# Patient Record
Sex: Female | Born: 1960 | Race: White | Hispanic: No | Marital: Married | Smoking: Former smoker
Health system: Southern US, Community
[De-identification: ages and names within clinical notes are randomized; demographics above are authoritative.]

## PROBLEM LIST (undated history)

## (undated) DIAGNOSIS — I10 Essential (primary) hypertension: Secondary | ICD-10-CM

## (undated) DIAGNOSIS — B9562 Methicillin resistant Staphylococcus aureus infection as the cause of diseases classified elsewhere: Secondary | ICD-10-CM

## (undated) DIAGNOSIS — N8110 Cystocele, unspecified: Secondary | ICD-10-CM

## (undated) DIAGNOSIS — G43909 Migraine, unspecified, not intractable, without status migrainosus: Secondary | ICD-10-CM

## (undated) DIAGNOSIS — C649 Malignant neoplasm of unspecified kidney, except renal pelvis: Secondary | ICD-10-CM

## (undated) DIAGNOSIS — R7881 Bacteremia: Secondary | ICD-10-CM

## (undated) DIAGNOSIS — I2699 Other pulmonary embolism without acute cor pulmonale: Secondary | ICD-10-CM

## (undated) DIAGNOSIS — I639 Cerebral infarction, unspecified: Secondary | ICD-10-CM

## (undated) HISTORY — PX: RECTOCELE REPAIR: SHX761

## (undated) HISTORY — PX: CYSTOCELE REPAIR: SHX163

## (undated) HISTORY — PX: TUBAL LIGATION: SHX77

## (undated) HISTORY — DX: Cystocele, unspecified: N81.10

## (undated) HISTORY — PX: ABDOMINAL HYSTERECTOMY: SHX81

## (undated) HISTORY — DX: Essential (primary) hypertension: I10

## (undated) HISTORY — DX: Cerebral infarction, unspecified: I63.9

---

## 2002-09-16 ENCOUNTER — Other Ambulatory Visit: Admission: RE | Admit: 2002-09-16 | Discharge: 2002-09-16 | Payer: Self-pay | Admitting: Obstetrics and Gynecology

## 2002-11-28 ENCOUNTER — Encounter: Payer: Self-pay | Admitting: Internal Medicine

## 2002-11-28 ENCOUNTER — Ambulatory Visit (HOSPITAL_COMMUNITY): Admission: RE | Admit: 2002-11-28 | Discharge: 2002-11-28 | Payer: Self-pay | Admitting: Internal Medicine

## 2004-05-01 ENCOUNTER — Encounter: Admission: RE | Admit: 2004-05-01 | Discharge: 2004-05-01 | Payer: Self-pay | Admitting: Internal Medicine

## 2006-08-19 ENCOUNTER — Encounter (INDEPENDENT_AMBULATORY_CARE_PROVIDER_SITE_OTHER): Payer: Self-pay | Admitting: Obstetrics and Gynecology

## 2006-08-19 ENCOUNTER — Inpatient Hospital Stay (HOSPITAL_COMMUNITY): Admission: RE | Admit: 2006-08-19 | Discharge: 2006-08-21 | Payer: Self-pay | Admitting: Obstetrics and Gynecology

## 2010-06-18 NOTE — Discharge Summary (Signed)
[name], [name]               ACCOUNT NO.:  [id number]   MEDICAL RECORD NO.:  [id number]          PATIENT TYPE:  INP   LOCATION:  9316                          FACILITY:  [facility]   PHYSICIAN:  Duke Salvia. Marcelle Overlie, M.D.DATE OF BIRTH:  [birth year]   DATE OF ADMISSION:  08/19/2006  DATE OF DISCHARGE:  08/21/2006                               DISCHARGE SUMMARY   DISCHARGE DIAGNOSES:  1. Symptomatic cystocele and rectocele.  2. Ovarian cyst with pelvic pain.  3. Laparotomy with bilateral salpingo-oophorectomy, lysis of adhesions      with anterior-posterior repair, SSLF this admission.   SUMMARY OF THE HISTORY AND PHYSICAL EXAMINATION:  Please see admission  H&P for details.  Briefly, a 50 year old with prior TAH Burch suspension  presents for correction of cystocele, rectocele and for BSO for pelvic  pain and ovarian cyst.   HOSPITAL COURSE:  On July 16 under general anesthesia, the patient  underwent laparotomy with BSO, lysis of adhesions, A&P repair with SSLF.  On the first postoperative day, her catheter was removed.  Her diet was  advanced.  By the second postoperative day, her incision was clean and  dry.  She was tolerating a regular diet.  Abdominal exam was  unremarkable.  She was afebrile and was ready for discharge at that  point.   LABORATORY DATA:  CMET on admission normal except for glucose slightly  elevated at 114.  Preoperative CBC:  WBC 8.5, hemoglobin of 14.1,  hematocrit 40.9, platelets 244.  Blood type is A+.  Postoperative CBC on  July 17, WBC 15.7, hemoglobin 10.9, hematocrit 31.4.   DISPOSITION:  The patient was discharged on Tylox p.r.n. pain, stool  softeners twice daily.  We did discuss laxative use p.r.n., sitz baths  and Dermaplast as needed p.r.n.  Will return to the office in 3 days to  have the clips removed.  Tylox 1 or 2 p.o. q.4-6h. p.r.n. pain.  Specific instructions regarding diet, sex, exercise advised.  Advised to  report any temperature over  101, incisional redness or drainage,  increased pain or bleeding, difficulty voiding or severe constipation.  She was advised to continue with her routine antihypertensives and  hyperlipidemia medications.   CONDITION:  Good.   ACTIVITY:  Graded increase.      Richard M. Marcelle Overlie, M.D.  Electronically Signed     RMH/MEDQ  D:  08/21/2006  T:  08/21/2006  Job:  [id number]

## 2010-06-18 NOTE — Op Note (Signed)
[name], [name]               ACCOUNT NO.:  [id number]   MEDICAL RECORD NO.:  [id number]          PATIENT TYPE:  AMB   LOCATION:  SDC                           FACILITY:  [facility]   PHYSICIAN:  Duke Salvia. Marcelle Overlie, M.D.DATE OF BIRTH:  [birth year]   DATE OF PROCEDURE:  08/19/2006  DATE OF DISCHARGE:                               OPERATIVE REPORT   PREOPERATIVE DIAGNOSIS:  1. Ovarian cyst, pelvic pain.  2. Symptomatic cystocele and rectocele with a grade 1 vaginal vault      prolapse.   POSTOPERATIVE DIAGNOSIS:  1. Ovarian cyst, pelvic pain.  2. Symptomatic cystocele and rectocele with a grade 1 vaginal vault      prolapse.  3. Plus dense pelvic adhesions.   PROCEDURE:  1. Laparotomy with lysis of adhesions BSO.  2. Anterior posterior colporrhaphy,  3. Sacrospinous ligament vaginal cuff fixation.   PROCEDURE AND FINDINGS:  The patient was taken to the operating room and  after an adequate level of general endotracheal anesthesia obtained, the  patient in frog-leg position.  The abdomen and vagina prepped and draped  in usual manner for abdominal and vaginal procedures.  Foley catheter  was positioned draining clear urine.  Transverse incision made through  the old well-healed Pfannenstiel's scar, carried down to the fascia  which was incised and extended transversely.  Rectus muscle divided  midline.  Peritoneum entered superiorly without incident and extended in  vertical manner.  Were significant omental adhesions into the anterior  abdominal wall which were dissected in avascular plane.  Once this was  accomplished retractor was positioned.  Bowels packed superiorly out of  the field.  The left ovary was enlarged approximately 5-6 cm with the  smooth-walled cyst but was moderately adherent behind sigmoid colon  which required dissection in an avascular plane.  The sigmoid colon off  of the ovary.  Once this was partially completed the left round ligament  was clamped, divided  suture-ligated with 0 Vicryl, held temporarily and  the retroperitoneal space on the left side was developed.  The course of  the left pelvic ureter was noted to be well below, the left IP ligament  was clamped, divided, doubly free tied with 0 Vicryl suture.  The  remainder of the ovarian cyst was dissected free and sent to pathology  in toto.  This was hemostatic.  On the opposite side.  The right ovary  was enlarged approximately 3 cm had some filmy adhesions that were  dissected easily on the right side.  In similar fashion the right round  ligaments clamped and divided and suture-ligated with 0 Vicryl.  The  right retroperitoneal space was developed.  The course of the ureter was  traced out and noted to be well below, the right IP ligament was  clamped, divided and double free tied with 0 Vicryl suture.  The  remainder of the ovary was dissected and sent to pathology.  These areas  were inspected, irrigated noted be hemostatic. Prior to closure sponge,  needle and sharp counts reported correct x2.  Peritoneum closed with a  running 2-0 Vicryl  suture.  Rectus muscles reapproximated 2-0 Vicryl  interrupted sutures.  Fascia closed from laterally to midline on either  side with a 0 PDS suture.  Subcutaneous fat was hemostatic.  Clips Steri-  Strips used on the skin. The legs were extended and the second portion  procedure was started at that point.   The vaginal cuff was grasped with Allis clamps and the midline incision  made on anterior vaginal mucosa from the cuff up to close to the UV  angle.  The cystocele was then reduced with sharp and blunt dissection.  The 2-0 Vicryl interrupted sutures were then used to plicate the  paravesical fascia in the midline.  Excess vaginal mucosa was trimmed  and the mucosa reapproximated with 2-0 Vicryl interrupted sutures.  The  posterior repair, a small triangle of perineal skin was removed.  The  posterior mucosa was divided midline two-thirds  the way up posterior  wall. Sharp and blunt dissection was then used to reduce the rectocele.  The right ischial spine was easily palpable. Retractor was used to  position the rectum laterally.  The surgeon's finger was then used to  palpate the spine and the Capio device was used to place a suture  through the sacrospinous ligament one to two fingerbreadths off of the  spine.  Once this was anchored it was positioned in the appropriate spot  at the vaginal cuff to the posterior mucosa and held temporarily. Small  amount of excess mucosa was then trimmed.  The rectocele was reduced by  plicating the perirectal fascia in the midline.  The vaginal mucosa was  then closed 1/3 of the way down with interrupted 2-0 Vicryl sutures.  And then the sacrospinous fixation suture was tied down to suspend the  cuff.  The remainder of the vaginal mucosa approximated with 2-0 Vicryl  interrupted sutures with 3-0 Vicryl Rapide sutures on the perineum.  One  inch vaginal packs packed with Estrace was then positioned.  Clear urine  noted in the end of case.  She tolerated this well, went to recovery  room in good condition.      Richard M. Marcelle Overlie, M.D.  Electronically Signed     RMH/MEDQ  D:  08/19/2006  T:  08/19/2006  Job:  [id number]

## 2010-11-18 LAB — COMPREHENSIVE METABOLIC PANEL
ALT: 24
AST: 20
Albumin: 3.7
Alkaline Phosphatase: 89
BUN: 9
CO2: 26
Calcium: 9.2
Chloride: 103
Creatinine, Ser: 0.6
GFR calc Af Amer: >60
GFR calc non Af Amer: >60
Glucose, Bld: 114 — ABNORMAL HIGH
Potassium: 4.1
Sodium: 135
Total Bilirubin: 0.7
Total Protein: 6.7

## 2010-11-18 LAB — CBC
HCT: 31.4 — ABNORMAL LOW
HCT: 40.9
Hemoglobin: 10.9 — ABNORMAL LOW
Hemoglobin: 14.1
MCHC: 34.6
MCHC: 34.6
MCV: 91.2
MCV: 92.5
Platelets: 198
Platelets: 244
RBC: 3.39 — ABNORMAL LOW
RBC: 4.48
RDW: 12.7
RDW: 13
WBC: 15.7 — ABNORMAL HIGH
WBC: 8.5

## 2010-11-18 LAB — TYPE AND SCREEN
ABO/RH(D): A POS
Antibody Screen: NEGATIVE

## 2010-11-18 LAB — ABO/RH: ABO/RH(D): A POS

## 2011-11-13 ENCOUNTER — Ambulatory Visit (INDEPENDENT_AMBULATORY_CARE_PROVIDER_SITE_OTHER): Payer: BC Managed Care – PPO | Admitting: Family Medicine

## 2011-11-13 ENCOUNTER — Ambulatory Visit: Payer: BC Managed Care – PPO

## 2011-11-13 VITALS — BP 156/82 | HR 84 | Temp 98.2°F | Resp 16 | Ht 64.0 in | Wt 166.0 lb

## 2011-11-13 DIAGNOSIS — M533 Sacrococcygeal disorders, not elsewhere classified: Secondary | ICD-10-CM

## 2011-11-13 DIAGNOSIS — M25559 Pain in unspecified hip: Secondary | ICD-10-CM

## 2011-11-13 DIAGNOSIS — IMO0002 Reserved for concepts with insufficient information to code with codable children: Secondary | ICD-10-CM | POA: Insufficient documentation

## 2011-11-13 DIAGNOSIS — M25551 Pain in right hip: Secondary | ICD-10-CM

## 2011-11-13 DIAGNOSIS — T148XXA Other injury of unspecified body region, initial encounter: Secondary | ICD-10-CM

## 2011-11-13 DIAGNOSIS — N8110 Cystocele, unspecified: Secondary | ICD-10-CM | POA: Insufficient documentation

## 2011-11-13 MED ORDER — MELOXICAM 7.5 MG PO TABS: 7.5000 mg | ORAL_TABLET | Freq: Every day | ORAL | Status: DC

## 2011-11-13 MED ORDER — CYCLOBENZAPRINE HCL 10 MG PO TABS: 10.0000 mg | ORAL_TABLET | Freq: Three times a day (TID) | ORAL | Status: DC | PRN

## 2011-11-13 MED ORDER — TRAMADOL HCL 50 MG PO TABS: 50.0000 mg | ORAL_TABLET | Freq: Three times a day (TID) | ORAL | Status: DC | PRN

## 2011-11-13 NOTE — Progress Notes (Signed)
[facility]:  Office Visit  Chief Complaint:  Chief Complaint  Patient presents with  . Tailbone Pain    with ischial pain-pulling sensation-was going down to do split and realized not able to do so-tried standing and when doing her loud pop 2 weeks ago    HPI: [name] is a 51 y.o. female who complains of  Of right 7-8/10 localized  ischial pain s/p split x 2 weeks ago. Pulling pain, denies numbness or tingles. Has not affected incontinence. Has  had prior back pain bt nothing like this. Heard a pop when this occurred. Tried Ibuprofen without relief. Took 600 mg BID x 1 day without relief.   Past Medical History  Diagnosis Date  . Vaginal prolapse   . Hypertension    Past Surgical History  Procedure Date  . Rectocele repair   . Tubal ligation   . Abdominal hysterectomy   . Cystocele repair    History   Social History  . Marital Status: Married    Spouse Name: N/A    Number of Children: N/A  . Years of Education: N/A   Social History Main Topics  . Smoking status: Former Smoker    Quit date: 11/12/1981  . Smokeless tobacco: None  . Alcohol Use: Yes  . Drug Use: No  . Sexually Active: None   Other Topics Concern  . None   Social History Narrative  . None   Family History  Problem Relation Age of Onset  . COPD Mother   . Hypertension Mother    No Known Allergies Prior to Admission medications   Medication Sig Start Date End Date Taking? Authorizing Provider  aspirin 81 MG tablet Take 81 mg by mouth daily.   Yes Historical Provider, MD  telmisartan-hydrochlorothiazide (MICARDIS HCT) 80-12.5 MG per tablet Take 1 tablet by mouth daily.   Yes Historical Provider, MD     ROS: The patient denies fevers, chills, night sweats, unintentional weight loss, chest pain, palpitations, wheezing, dyspnea on exertion, nausea, vomiting, abdominal pain, dysuria, hematuria, melena, numbness, weakness, or tingling.  All other systems have been  reviewed and were otherwise negative with the exception of those mentioned in the HPI and as above.    PHYSICAL EXAM: Filed Vitals:   11/13/11 1250  BP: 156/82  Pulse: 84  Temp: 98.2 F (36.8 C)  Resp: 16   Filed Vitals:   11/13/11 1250  Height: 5\' 4"  (1.626 m)  Weight: 166 lb (75.297 kg)   Body mass index is 28.49 kg/(m^2).  General: Alert, no acute distress HEENT:  Normocephalic, atraumatic, oropharynx patent.  Cardiovascular:  Regular rate and rhythm, no rubs murmurs or gallops.  No Carotid bruits, radial pulse intact. No pedal edema.  Respiratory: Clear to auscultation bilaterally.  No wheezes, rales, or rhonchi.  No cyanosis, no use of accessory musculature GI: No organomegaly, abdomen is soft and non-tender, positive bowel sounds.  No masses. Skin: No rashes. Neurologic: Facial musculature symmetric. Psychiatric: Patient is appropriate throughout our interaction. Lymphatic: No cervical lymphadenopathy Musculoskeletal: Gait intact. Right hip-nl L spine- normal ROM, 5/5 strength, 2/2 DTR, sensation intact Right inferior ischium tenderness   LABS: Results for orders placed during the hospital encounter of 08/19/06  CBC      Component Value Range   WBC 8.5     RBC 4.48     Hemoglobin 14.1     HCT 40.9     MCV 91.2     MCHC 34.6  RDW 12.7     Platelets 244    COMPREHENSIVE METABOLIC PANEL      Component Value Range   Sodium 135     Potassium 4.1     Chloride 103     CO2 26     Glucose, Bld 114 (*)    BUN 9     Creatinine, Ser 0.60     Calcium 9.2     Total Protein 6.7     Albumin 3.7     AST 20     ALT 24     Alkaline Phosphatase 89     Total Bilirubin 0.7     GFR calc non Af Amer >60     GFR calc Af Amer       Value: >60            The eGFR has been calculated     using the MDRD equation.     This calculation has not been     validated in all clinical  TYPE AND SCREEN      Component Value Range   ABO/RH(D) A POS     Antibody Screen NEG      Sample Expiration 08/21/2006    ABO/RH      Component Value Range   ABO/RH(D) A POS    CBC      Component Value Range   WBC 15.7 (*)    RBC 3.39 (*)    Hemoglobin 10.9 DELTA CHECK NOTED (*)    HCT 31.4 (*)    MCV 92.5     MCHC 34.6     RDW 13.0     Platelets 198       EKG/XRAY:   Primary read interpreted by Dr. Conley Rolls at [facility]. 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  . Sacral back pain Yes  . Acute right hip pain   . Sprain and strain    Rx Flexeril Rx Mobic Rx Tramadol Home exercises with ROM Will consider MRI if no improvement in the  Future F/u in 2 weeks    LE, THAO PHUONG, DO 11/13/2011 1:53 PM

## 2013-09-19 ENCOUNTER — Other Ambulatory Visit: Payer: Self-pay | Admitting: Obstetrics and Gynecology

## 2013-09-20 LAB — CYTOLOGY - PAP

## 2014-05-24 ENCOUNTER — Other Ambulatory Visit: Payer: Self-pay | Admitting: Internal Medicine

## 2014-05-24 ENCOUNTER — Ambulatory Visit
Admission: RE | Admit: 2014-05-24 | Discharge: 2014-05-24 | Disposition: A | Discharge status: Home / Self Care | Payer: BC Managed Care – PPO | Source: Ambulatory Visit | Attending: Internal Medicine | Admitting: Internal Medicine

## 2014-05-24 DIAGNOSIS — R0602 Shortness of breath: Secondary | ICD-10-CM

## 2014-08-11 ENCOUNTER — Ambulatory Visit
Admission: RE | Admit: 2014-08-11 | Discharge: 2014-08-11 | Disposition: A | Discharge status: Home / Self Care | Payer: BC Managed Care – PPO | Source: Ambulatory Visit | Attending: Nurse Practitioner | Admitting: Nurse Practitioner

## 2014-08-11 ENCOUNTER — Other Ambulatory Visit: Payer: Self-pay | Admitting: Nurse Practitioner

## 2014-08-11 DIAGNOSIS — R059 Cough, unspecified: Secondary | ICD-10-CM

## 2014-08-11 DIAGNOSIS — R05 Cough: Secondary | ICD-10-CM

## 2014-10-04 ENCOUNTER — Other Ambulatory Visit: Payer: Self-pay | Admitting: Obstetrics and Gynecology

## 2014-10-05 LAB — CYTOLOGY - PAP

## 2014-10-11 ENCOUNTER — Other Ambulatory Visit: Payer: Self-pay | Admitting: Obstetrics and Gynecology

## 2014-10-11 DIAGNOSIS — R928 Other abnormal and inconclusive findings on diagnostic imaging of breast: Secondary | ICD-10-CM

## 2014-10-30 ENCOUNTER — Ambulatory Visit
Admission: RE | Admit: 2014-10-30 | Discharge: 2014-10-30 | Disposition: A | Discharge status: Home / Self Care | Payer: BC Managed Care – PPO | Source: Ambulatory Visit | Attending: Obstetrics and Gynecology | Admitting: Obstetrics and Gynecology

## 2014-10-30 DIAGNOSIS — R928 Other abnormal and inconclusive findings on diagnostic imaging of breast: Secondary | ICD-10-CM

## 2018-11-23 ENCOUNTER — Other Ambulatory Visit: Payer: Self-pay | Admitting: Occupational Medicine

## 2018-11-23 ENCOUNTER — Other Ambulatory Visit: Payer: Self-pay

## 2018-11-23 ENCOUNTER — Ambulatory Visit: Payer: Self-pay

## 2018-11-23 DIAGNOSIS — M25561 Pain in right knee: Secondary | ICD-10-CM

## 2020-03-12 ENCOUNTER — Telehealth: Payer: Self-pay | Admitting: Hematology and Oncology

## 2020-03-12 NOTE — Telephone Encounter (Signed)
Received a new hem referral from Dr. Matthew Saras for anemia. [name] has been cld and scheduled to see Dr. Chryl Heck on 2/17 at 1040am. Pt aware to arrive 20 minutes early.

## 2020-03-22 ENCOUNTER — Encounter: Payer: Self-pay | Admitting: Hematology and Oncology

## 2020-03-22 ENCOUNTER — Inpatient Hospital Stay: Payer: BC Managed Care – PPO

## 2020-03-22 ENCOUNTER — Inpatient Hospital Stay: Payer: BC Managed Care – PPO | Attending: Hematology and Oncology | Admitting: Hematology and Oncology

## 2020-03-22 ENCOUNTER — Other Ambulatory Visit: Payer: Self-pay

## 2020-03-22 VITALS — BP 128/56 | HR 87 | Temp 97.7°F | Resp 18 | Ht 64.0 in | Wt 131.0 lb

## 2020-03-22 DIAGNOSIS — I10 Essential (primary) hypertension: Secondary | ICD-10-CM | POA: Diagnosis not present

## 2020-03-22 DIAGNOSIS — Z87891 Personal history of nicotine dependence: Secondary | ICD-10-CM | POA: Insufficient documentation

## 2020-03-22 DIAGNOSIS — R7402 Elevation of levels of lactic acid dehydrogenase (LDH): Secondary | ICD-10-CM | POA: Diagnosis not present

## 2020-03-22 DIAGNOSIS — Z800 Family history of malignant neoplasm of digestive organs: Secondary | ICD-10-CM | POA: Diagnosis not present

## 2020-03-22 DIAGNOSIS — D649 Anemia, unspecified: Secondary | ICD-10-CM

## 2020-03-22 DIAGNOSIS — R634 Abnormal weight loss: Secondary | ICD-10-CM | POA: Diagnosis not present

## 2020-03-22 DIAGNOSIS — Z79899 Other long term (current) drug therapy: Secondary | ICD-10-CM | POA: Insufficient documentation

## 2020-03-22 DIAGNOSIS — D75839 Thrombocytosis, unspecified: Secondary | ICD-10-CM | POA: Diagnosis not present

## 2020-03-22 DIAGNOSIS — Z806 Family history of leukemia: Secondary | ICD-10-CM | POA: Insufficient documentation

## 2020-03-22 LAB — IRON AND TIBC
Iron: 22 ug/dL — ABNORMAL LOW (ref 41–142)
Saturation Ratios: 7 % — ABNORMAL LOW (ref 21–57)
TIBC: 325 ug/dL (ref 236–444)
UIBC: 303 ug/dL (ref 120–384)

## 2020-03-22 LAB — CMP (CANCER CENTER ONLY)
ALT: 19 U/L (ref 0–44)
AST: 50 U/L — ABNORMAL HIGH (ref 15–41)
Albumin: 3.3 g/dL — ABNORMAL LOW (ref 3.5–5.0)
Alkaline Phosphatase: 81 U/L (ref 38–126)
Anion gap: 9 (ref 5–15)
BUN: 17 mg/dL (ref 8–23)
CO2: 25 mmol/L (ref 22–32)
Calcium: 11.5 mg/dL — ABNORMAL HIGH (ref 8.9–10.3)
Chloride: 99 mmol/L (ref 98–111)
Creatinine: 1.11 mg/dL — ABNORMAL HIGH (ref 0.44–1.00)
GFR, Estimated: 57 mL/min — ABNORMAL LOW (ref >60)
Glucose, Bld: 117 mg/dL — ABNORMAL HIGH (ref 70–99)
Potassium: 4.3 mmol/L (ref 3.5–5.1)
Sodium: 133 mmol/L — ABNORMAL LOW (ref 135–145)
Total Bilirubin: 0.4 mg/dL (ref 0.3–1.2)
Total Protein: 7.9 g/dL (ref 6.5–8.1)

## 2020-03-22 LAB — CBC WITH DIFFERENTIAL/PLATELET
Abs Immature Granulocytes: 0.04 10*3/uL (ref 0.00–0.07)
Basophils Absolute: 0.1 10*3/uL (ref 0.0–0.1)
Basophils Relative: 1 %
Eosinophils Absolute: 0.2 10*3/uL (ref 0.0–0.5)
Eosinophils Relative: 3 %
HCT: 30.2 % — ABNORMAL LOW (ref 36.0–46.0)
Hemoglobin: 9.3 g/dL — ABNORMAL LOW (ref 12.0–15.0)
Immature Granulocytes: 1 %
Lymphocytes Relative: 25 %
Lymphs Abs: 2 10*3/uL (ref 0.7–4.0)
MCH: 25.1 pg — ABNORMAL LOW (ref 26.0–34.0)
MCHC: 30.8 g/dL (ref 30.0–36.0)
MCV: 81.4 fL (ref 80.0–100.0)
Monocytes Absolute: 0.7 10*3/uL (ref 0.1–1.0)
Monocytes Relative: 8 %
Neutro Abs: 5.2 10*3/uL (ref 1.7–7.7)
Neutrophils Relative %: 62 %
Platelets: 440 10*3/uL — ABNORMAL HIGH (ref 150–400)
RBC: 3.71 MIL/uL — ABNORMAL LOW (ref 3.87–5.11)
RDW: 14.3 % (ref 11.5–15.5)
WBC: 8.2 10*3/uL (ref 4.0–10.5)
nRBC: 0 % (ref 0.0–0.2)

## 2020-03-22 LAB — LACTATE DEHYDROGENASE: LDH: 781 U/L — ABNORMAL HIGH (ref 98–192)

## 2020-03-22 LAB — VITAMIN B12: Vitamin B-12: 637 pg/mL (ref 180–914)

## 2020-03-22 LAB — RETICULOCYTES
Immature Retic Fract: 23.9 % — ABNORMAL HIGH (ref 2.3–15.9)
RBC.: 3.7 MIL/uL — ABNORMAL LOW (ref 3.87–5.11)
Retic Count, Absolute: 61.1 10*3/uL (ref 19.0–186.0)
Retic Ct Pct: 1.7 % (ref 0.4–3.1)

## 2020-03-22 LAB — FERRITIN: Ferritin: 859 ng/mL — ABNORMAL HIGH (ref 11–307)

## 2020-03-22 NOTE — Progress Notes (Signed)
[facility] NOTE  Patient Care Team: Josetta Huddle, MD as PCP - General (Internal Medicine)  CHIEF COMPLAINTS/PURPOSE OF CONSULTATION:  Anemia.  ASSESSMENT & PLAN:  No problem-specific Assessment & Plan notes found for this encounter.  No orders of the defined types were placed in this encounter.  1. Normocytic normochromic anemia ROS and PE with out any major concerns Review of labs over the past several years shows long standing anemia which may have progressed. Ferritin of 900, TIBC not consistent with iron deficiency Will also evaluate for monoclonal gammopathy, K16 and folic acid levels, hemolysis today. If no evidence of clear etiology, we should consider GI evaluation and BMB. She is agreeable with this approach  2. Age appropriate cancer screening, she is uptodate except she is overdue on colonoscopy.  3.HTN well controlled on losartan/HCTZ  HISTORY OF PRESENTING ILLNESS:   [name] 60 y.o. female is here because of anemia.  [name] is here for an initial visit for evaluation of anemia by herself.  She is a good historian.  She denies any complaints except for some mild weight loss which is unintentional over the past yr about 5-10 lbs denies any other complaints.  She denies any fevers, drenching night sweats, loss of appetite.  She denies any changes in her breathing, bowel habits or urinary habits.  She has not noticed any blood in her stool.  No hematuria. She is overdue for colonoscopy, has been 11 years since her last one.  No known family history of colon cancer.  She is up-to-date with her other age-appropriate cancer screening.  She has had anemia many years ago that she can remember. Rest of the pertinent ten point ROS as mentioned below.  REVIEW OF SYSTEMS:   Constitutional: Denies fevers, chills or abnormal night sweats Eyes: Denies blurriness of vision, double vision or watery eyes Ears, nose, mouth, throat, and face: Denies  mucositis or sore throat Respiratory: Denies cough, dyspnea or wheezes Cardiovascular: Denies palpitation, chest discomfort or lower extremity swelling Gastrointestinal:  Denies nausea, heartburn or change in bowel habits Skin: Denies abnormal skin rashes Lymphatics: Denies new lymphadenopathy or easy bruising Neurological:Denies numbness, tingling or new weaknesses Behavioral/Psych: Mood is stable, no new changes  All other systems were reviewed with the patient and are negative.  MEDICAL HISTORY:  Past Medical History:  Diagnosis Date  . Hypertension   . Vaginal prolapse     SURGICAL HISTORY: Past Surgical History:  Procedure Laterality Date  . ABDOMINAL HYSTERECTOMY    . CYSTOCELE REPAIR    . RECTOCELE REPAIR    . TUBAL LIGATION      SOCIAL HISTORY: Social History   Socioeconomic History  . Marital status: Married    Spouse name: Not on file  . Number of children: Not on file  . Years of education: Not on file  . Highest education level: Not on file  Occupational History  . Not on file  Tobacco Use  . Smoking status: Former Smoker    Quit date: 11/12/1981    Years since quitting: 38.3  . Smokeless tobacco: Never Used  Substance and Sexual Activity  . Alcohol use: Yes  . Drug use: No  . Sexual activity: Not on file  Other Topics Concern  . Not on file  Social History Narrative  . Not on file   Social Determinants of Health   Financial Resource Strain: Not on file  Food Insecurity: Not on file  Transportation Needs: Not on  file  Physical Activity: Not on file  Stress: Not on file  Social Connections: Not on file  Intimate Partner Violence: Not on file    FAMILY HISTORY: Family History  Problem Relation Age of Onset  . COPD Mother   . Hypertension Mother   . Leukemia Father        chronic mylemonocytic leukemia  . Colon cancer Paternal Grandfather     ALLERGIES:  is allergic to amlodipine and crestor [rosuvastatin].  MEDICATIONS:  Current  Outpatient Medications  Medication Sig Dispense Refill  . fenofibrate (TRICOR) 145 MG tablet Take 1 tablet by mouth daily.    . Ferrous Sulfate (IRON) 325 (65 Fe) MG TABS Take 1 tablet by mouth daily.    Marland Kitchen aspirin 81 MG tablet Take 81 mg by mouth daily.    Marland Kitchen losartan-hydrochlorothiazide (HYZAAR) 100-25 MG tablet Take 1 tablet by mouth daily.    . Multiple Vitamin (ONE DAILY) tablet Take 1 tablet by mouth daily.     No current facility-administered medications for this visit.     PHYSICAL EXAMINATION: ECOG PERFORMANCE STATUS: 0 - Asymptomatic  Vitals:   03/22/20 1013  BP: (!) 128/56  Pulse: 87  Resp: 18  Temp: 97.7 F (36.5 C)  SpO2: 100%   Filed Weights   03/22/20 1013  Weight: 131 lb (59.4 kg)    GENERAL:alert, no distress and comfortable SKIN: skin color, texture, turgor are normal, no rashes or significant lesions EYES: normal, conjunctiva are pink and non-injected, sclera clear OROPHARYNX:no exudate, no erythema and lips, buccal mucosa, and tongue normal  NECK: supple, thyroid normal size, non-tender, without nodularity LYMPH:  no palpable lymphadenopathy in the cervical, axillary or inguinal LUNGS: clear to auscultation and percussion with normal breathing effort HEART: regular rate & rhythm and no murmurs and no lower extremity edema ABDOMEN:abdomen soft, non-tender and normal bowel sounds Musculoskeletal:no cyanosis of digits and no clubbing  PSYCH: alert & oriented x 3 with fluent speech NEURO: no focal motor/sensory deficits  LABORATORY DATA:  I have reviewed the data as listed Lab Results  Component Value Date   WBC 15.7 (H) 08/20/2006   HGB 10.9 DELTA CHECK NOTED (L) 08/20/2006   HCT 31.4 (L) 08/20/2006   MCV 92.5 08/20/2006   PLT 198 08/20/2006     Chemistry      Component Value Date/Time   NA 135 08/18/2006 1030   K 4.1 08/18/2006 1030   CL 103 08/18/2006 1030   CO2 26 08/18/2006 1030   BUN 9 08/18/2006 1030   CREATININE 0.60 08/18/2006 1030       Component Value Date/Time   CALCIUM 9.2 08/18/2006 1030   ALKPHOS 89 08/18/2006 1030   AST 20 08/18/2006 1030   ALT 24 08/18/2006 1030   BILITOT 0.7 08/18/2006 1030       RADIOGRAPHIC STUDIES: I have personally reviewed the radiological images as listed and agreed with the findings in the report. No results found.  All questions were answered. The patient knows to call the clinic with any problems, questions or concerns. I spent 45 minutes in the care of this patient including H and P, review of records, counseling and coordination of care. Reviewed records from our health system and [facility] Anemia noted, normocytic, normochromic, mild thrombocytosis.     Benay Pike, MD 03/22/2020 10:34 AM

## 2020-03-23 ENCOUNTER — Telehealth: Payer: Self-pay | Admitting: Hematology and Oncology

## 2020-03-23 ENCOUNTER — Telehealth: Payer: Self-pay

## 2020-03-23 LAB — FOLATE RBC
Folate, Hemolysate: 593 ng/mL
Folate, RBC: 1951 ng/mL (ref >498)
Hematocrit: 30.4 % — ABNORMAL LOW (ref 34.0–46.6)

## 2020-03-23 LAB — KAPPA/LAMBDA LIGHT CHAINS
Kappa free light chain: 31.4 mg/L — ABNORMAL HIGH (ref 3.3–19.4)
Kappa, lambda light chain ratio: 1.59 (ref 0.26–1.65)
Lambda free light chains: 19.7 mg/L (ref 5.7–26.3)

## 2020-03-23 NOTE — Telephone Encounter (Signed)
Scheduled appointment per 2/18 sch msg. Called patient, no answer. Left message with appointment date and time.

## 2020-03-23 NOTE — Telephone Encounter (Signed)
-----   Message from Benay Pike, MD sent at 03/22/2020  5:23 PM EST ----- [name], can we arrange for FU on 28 th, ok to overbook. Let her know that I need to talk to her about lab results.  Thanks

## 2020-03-23 NOTE — Telephone Encounter (Signed)
Patient was made aware of Dr. Rob Hickman recommendations and is aware that scheduling will be reaching out to her to schedule a time to come in and discuss lab work further on 2/28.

## 2020-03-27 LAB — PROTEIN ELECTROPHORESIS, SERUM, WITH REFLEX
A/G Ratio: 0.9 (ref 0.7–1.7)
Albumin ELP: 3.1 g/dL (ref 2.9–4.4)
Alpha-1-Globulin: 0.4 g/dL (ref 0.0–0.4)
Alpha-2-Globulin: 1 g/dL (ref 0.4–1.0)
Beta Globulin: 1.1 g/dL (ref 0.7–1.3)
Gamma Globulin: 1 g/dL (ref 0.4–1.8)
Globulin, Total: 3.5 g/dL (ref 2.2–3.9)
Total Protein ELP: 6.6 g/dL (ref 6.0–8.5)

## 2020-04-02 ENCOUNTER — Other Ambulatory Visit: Payer: Self-pay

## 2020-04-02 ENCOUNTER — Telehealth: Payer: Self-pay | Admitting: Hematology and Oncology

## 2020-04-02 ENCOUNTER — Telehealth (HOSPITAL_COMMUNITY): Payer: Self-pay

## 2020-04-02 ENCOUNTER — Inpatient Hospital Stay: Payer: BC Managed Care – PPO | Admitting: Hematology and Oncology

## 2020-04-02 VITALS — BP 128/63 | HR 100 | Temp 97.5°F | Resp 17 | Ht 64.0 in | Wt 130.3 lb

## 2020-04-02 DIAGNOSIS — D649 Anemia, unspecified: Secondary | ICD-10-CM | POA: Diagnosis not present

## 2020-04-02 DIAGNOSIS — I10 Essential (primary) hypertension: Secondary | ICD-10-CM

## 2020-04-02 NOTE — Telephone Encounter (Signed)
Scheduled per los. Declined printout. Will use mychart

## 2020-04-02 NOTE — Progress Notes (Signed)
Most recent labs, office visit note and referral faxed to Shonda Mcbride at [phone] per MD I[place]ku

## 2020-04-02 NOTE — Progress Notes (Signed)
[facility] NOTE  Patient Care Team: Josetta Huddle, MD as PCP - General (Internal Medicine)  CHIEF COMPLAINTS/PURPOSE OF CONSULTATION:  Anemia.  ASSESSMENT & PLAN:  No problem-specific Assessment & Plan notes found for this encounter.  No orders of the defined types were placed in this encounter.  1. Normocytic normochromic anemia  Review of labs over the past several years shows long standing anemia which may have progressed. Ferritin of 900, TIBC not consistent with iron deficiency. B12 and folate labs normal. 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 Some hypercalcemia noted, repeat CMP ordered. Encouraged hydration. LDH elevated, retic count normal. It does appear that there is some inflammatory etiology to anemia, but no definitive etiology yet We have discussed about proceeding with gastroenterology evaluation, and repeat BMB. Ordered CT guided BMB. Offered BMB with [place] too but she prefers later appointments. I also called [facility] to expedite her GI appointments. Discussed with Reuben Likes who agreed to expedite this. New referral ordered.  2. Age appropriate cancer screening, she is uptodate except she is overdue on colonoscopy.  3.HTN well controlled on losartan/HCTZ  4. Hypercalcemia, no clear etiology Encouraged repeat CMP and hydration. She will get the lab drawn this week.  HISTORY OF PRESENTING ILLNESS:   [name] 60 y.o. female is here because of anemia.  [name] is here for a FU with her sister.  She is a good historian.   Since last visit, no major change in complaints, continues to feel tires and now has no appetite. Weight loss, she is not exactly sure of how much is intentional and how much is non intentional. Previously mentioned that she probably lost about 5-10 lbs, today says it could have been more.  No other interim changes, she denied any hematochezia or melena before. She is overdue for colonoscopy,  has been 11 years since her last one.  No known family history of colon cancer.  She is up-to-date with her other age-appropriate cancer screening.  Rest of the pertinent ten point ROS as mentioned below.  REVIEW OF SYSTEMS:   Constitutional: Denies fevers, chills or abnormal night sweats Eyes: Denies blurriness of vision, double vision or watery eyes Ears, nose, mouth, throat, and face: Denies mucositis or sore throat Respiratory: Denies cough, dyspnea or wheezes Cardiovascular: Denies palpitation, chest discomfort or lower extremity swelling Gastrointestinal:  Denies nausea, heartburn or change in bowel habits Skin: Denies abnormal skin rashes Lymphatics: Denies new lymphadenopathy or easy bruising Neurological:Denies numbness, tingling or new weaknesses Behavioral/Psych: Mood is stable, no new changes  All other systems were reviewed with the patient and are negative.  MEDICAL HISTORY:  Past Medical History:  Diagnosis Date  . Hypertension   . Vaginal prolapse     SURGICAL HISTORY: Past Surgical History:  Procedure Laterality Date  . ABDOMINAL HYSTERECTOMY    . CYSTOCELE REPAIR    . RECTOCELE REPAIR    . TUBAL LIGATION      SOCIAL HISTORY: Social History   Socioeconomic History  . Marital status: Married    Spouse name: Not on file  . Number of children: Not on file  . Years of education: Not on file  . Highest education level: Not on file  Occupational History  . Not on file  Tobacco Use  . Smoking status: Former Smoker    Quit date: 11/12/1981    Years since quitting: 38.4  . Smokeless tobacco: Never Used  Substance and Sexual Activity  . Alcohol use:  Yes  . Drug use: No  . Sexual activity: Not on file  Other Topics Concern  . Not on file  Social History Narrative  . Not on file   Social Determinants of Health   Financial Resource Strain: Not on file  Food Insecurity: Not on file  Transportation Needs: Not on file  Physical Activity: Not on file   Stress: Not on file  Social Connections: Not on file  Intimate Partner Violence: Not on file    FAMILY HISTORY: Family History  Problem Relation Age of Onset  . COPD Mother   . Hypertension Mother   . Leukemia Father        chronic mylemonocytic leukemia  . Colon cancer Paternal Grandfather     ALLERGIES:  is allergic to amlodipine and crestor [rosuvastatin].  MEDICATIONS:  Current Outpatient Medications  Medication Sig Dispense Refill  . aspirin 81 MG tablet Take 81 mg by mouth daily.    . fenofibrate (TRICOR) 145 MG tablet Take 1 tablet by mouth daily.    Marland Kitchen losartan-hydrochlorothiazide (HYZAAR) 100-25 MG tablet Take 1 tablet by mouth daily.    . Multiple Vitamin (ONE DAILY) tablet Take 1 tablet by mouth daily.    . Ferrous Sulfate (IRON) 325 (65 Fe) MG TABS Take 1 tablet by mouth daily. (Patient not taking: Reported on 04/02/2020)     No current facility-administered medications for this visit.     PHYSICAL EXAMINATION: ECOG PERFORMANCE STATUS: 0 - Asymptomatic  Vitals:   04/02/20 0906  BP: 128/63  Pulse: 100  Resp: 17  Temp: (!) 97.5 F (36.4 C)  SpO2: 100%   Filed Weights   04/02/20 0906  Weight: 130 lb 4.8 oz (59.1 kg)    GENERAL:alert, no distress and comfortable SKIN: skin color, texture, turgor are normal, no rashes or significant lesions EYES: normal, conjunctiva are pink and non-injected, sclera clear OROPHARYNX:no exudate, no erythema and lips, buccal mucosa, and tongue normal  NECK: supple, thyroid normal size, non-tender, without nodularity LYMPH:  no palpable lymphadenopathy in the cervical, axillary or inguinal LUNGS: clear to auscultation and percussion with normal breathing effort HEART: regular rate & rhythm and no murmurs and no lower extremity edema ABDOMEN:abdomen soft, non-tender and normal bowel sounds Musculoskeletal:no cyanosis of digits and no clubbing  PSYCH: alert & oriented x 3 with fluent speech NEURO: no focal motor/sensory  deficits  LABORATORY DATA:  I have reviewed the data as listed Lab Results  Component Value Date   WBC 8.2 03/22/2020   HGB 9.3 (L) 03/22/2020   HCT 30.4 (L) 03/22/2020   MCV 81.4 03/22/2020   PLT 440 (H) 03/22/2020     Chemistry      Component Value Date/Time   NA 133 (L) 03/22/2020 1059   K 4.3 03/22/2020 1059   CL 99 03/22/2020 1059   CO2 25 03/22/2020 1059   BUN 17 03/22/2020 1059   CREATININE 1.11 (H) 03/22/2020 1059      Component Value Date/Time   CALCIUM 11.5 (H) 03/22/2020 1059   ALKPHOS 81 03/22/2020 1059   AST 50 (H) 03/22/2020 1059   ALT 19 03/22/2020 1059   BILITOT 0.4 03/22/2020 1059     Reviewed most recent labs. No evidence of nutritional deficiency No MGUS LDH high but retic count normal.  RADIOGRAPHIC STUDIES: I have personally reviewed the radiological images as listed and agreed with the findings in the report. No results found.  All questions were answered. The patient knows to call the clinic with  any problems, questions or concerns. I spent at least 30 minutes in the care of this patient including H and P, review of records, counseling and coordination of care. Reviewed records from our health system and [facility] Anemia noted, normocytic, normochromic, mild thrombocytosis. Discussed with [facility] gastroenterology about the need for sooner appointment. New referral placed as well.    Benay Pike, MD 04/02/2020 9:12 AM

## 2020-04-04 ENCOUNTER — Inpatient Hospital Stay: Payer: BC Managed Care – PPO | Admitting: Hematology and Oncology

## 2020-04-04 ENCOUNTER — Other Ambulatory Visit: Payer: Self-pay | Admitting: Hematology and Oncology

## 2020-04-04 ENCOUNTER — Inpatient Hospital Stay: Payer: BC Managed Care – PPO | Attending: Hematology and Oncology

## 2020-04-04 ENCOUNTER — Other Ambulatory Visit: Payer: Self-pay

## 2020-04-04 DIAGNOSIS — R7402 Elevation of levels of lactic acid dehydrogenase (LDH): Secondary | ICD-10-CM | POA: Diagnosis not present

## 2020-04-04 DIAGNOSIS — R634 Abnormal weight loss: Secondary | ICD-10-CM | POA: Insufficient documentation

## 2020-04-04 DIAGNOSIS — Z87891 Personal history of nicotine dependence: Secondary | ICD-10-CM | POA: Diagnosis not present

## 2020-04-04 DIAGNOSIS — Z8249 Family history of ischemic heart disease and other diseases of the circulatory system: Secondary | ICD-10-CM | POA: Insufficient documentation

## 2020-04-04 DIAGNOSIS — Z800 Family history of malignant neoplasm of digestive organs: Secondary | ICD-10-CM | POA: Diagnosis not present

## 2020-04-04 DIAGNOSIS — D472 Monoclonal gammopathy: Secondary | ICD-10-CM | POA: Diagnosis not present

## 2020-04-04 DIAGNOSIS — D75839 Thrombocytosis, unspecified: Secondary | ICD-10-CM | POA: Diagnosis not present

## 2020-04-04 DIAGNOSIS — R5383 Other fatigue: Secondary | ICD-10-CM | POA: Insufficient documentation

## 2020-04-04 DIAGNOSIS — R200 Anesthesia of skin: Secondary | ICD-10-CM | POA: Diagnosis not present

## 2020-04-04 DIAGNOSIS — Z836 Family history of other diseases of the respiratory system: Secondary | ICD-10-CM | POA: Insufficient documentation

## 2020-04-04 DIAGNOSIS — D649 Anemia, unspecified: Secondary | ICD-10-CM

## 2020-04-04 DIAGNOSIS — Z806 Family history of leukemia: Secondary | ICD-10-CM | POA: Insufficient documentation

## 2020-04-04 DIAGNOSIS — Z79899 Other long term (current) drug therapy: Secondary | ICD-10-CM | POA: Insufficient documentation

## 2020-04-04 DIAGNOSIS — Z888 Allergy status to other drugs, medicaments and biological substances status: Secondary | ICD-10-CM | POA: Diagnosis not present

## 2020-04-04 LAB — CBC WITH DIFFERENTIAL/PLATELET
Abs Immature Granulocytes: 0.04 10*3/uL (ref 0.00–0.07)
Basophils Absolute: 0.1 10*3/uL (ref 0.0–0.1)
Basophils Relative: 1 %
Eosinophils Absolute: 0.1 10*3/uL (ref 0.0–0.5)
Eosinophils Relative: 2 %
HCT: 29.6 % — ABNORMAL LOW (ref 36.0–46.0)
Hemoglobin: 8.8 g/dL — ABNORMAL LOW (ref 12.0–15.0)
Immature Granulocytes: 1 %
Lymphocytes Relative: 22 %
Lymphs Abs: 1.8 10*3/uL (ref 0.7–4.0)
MCH: 24.4 pg — ABNORMAL LOW (ref 26.0–34.0)
MCHC: 29.7 g/dL — ABNORMAL LOW (ref 30.0–36.0)
MCV: 82.2 fL (ref 80.0–100.0)
Monocytes Absolute: 0.7 10*3/uL (ref 0.1–1.0)
Monocytes Relative: 9 %
Neutro Abs: 5.3 10*3/uL (ref 1.7–7.7)
Neutrophils Relative %: 65 %
Platelets: 475 10*3/uL — ABNORMAL HIGH (ref 150–400)
RBC: 3.6 MIL/uL — ABNORMAL LOW (ref 3.87–5.11)
RDW: 14.6 % (ref 11.5–15.5)
WBC: 8.1 10*3/uL (ref 4.0–10.5)
nRBC: 0 % (ref 0.0–0.2)

## 2020-04-04 LAB — CMP (CANCER CENTER ONLY)
ALT: 22 U/L (ref 0–44)
AST: 50 U/L — ABNORMAL HIGH (ref 15–41)
Albumin: 3.1 g/dL — ABNORMAL LOW (ref 3.5–5.0)
Alkaline Phosphatase: 74 U/L (ref 38–126)
Anion gap: 8 (ref 5–15)
BUN: 16 mg/dL (ref 8–23)
CO2: 26 mmol/L (ref 22–32)
Calcium: 11.1 mg/dL — ABNORMAL HIGH (ref 8.9–10.3)
Chloride: 100 mmol/L (ref 98–111)
Creatinine: 0.96 mg/dL (ref 0.44–1.00)
GFR, Estimated: >60 mL/min (ref >60)
Glucose, Bld: 148 mg/dL — ABNORMAL HIGH (ref 70–99)
Potassium: 4.5 mmol/L (ref 3.5–5.1)
Sodium: 134 mmol/L — ABNORMAL LOW (ref 135–145)
Total Bilirubin: 0.4 mg/dL (ref 0.3–1.2)
Total Protein: 7.4 g/dL (ref 6.5–8.1)

## 2020-04-04 NOTE — Progress Notes (Signed)
CB 

## 2020-04-05 ENCOUNTER — Telehealth: Payer: Self-pay

## 2020-04-05 NOTE — Telephone Encounter (Signed)
-----   Message from Otila Kluver, RN sent at 04/05/2020  8:55 AM EST -----  ----- Message ----- From: Benay Pike, MD Sent: 04/04/2020   5:54 PM EST To: [facility]  Please call the patient and let her know that calcium continues to be high, recommend oral hydration at least 2 L a day. Repeat CBC and CMP next week. Ordered. Lab to be scheduled, please help her with this. Then, Hemoglobin continues to drop but no indication for blood transfusion, hopefully the bone marrow evaluation will help [place].

## 2020-04-05 NOTE — Telephone Encounter (Signed)
Communicated lab results/recommendations to the Patient and she verbalized understanding. A scheduling message has been sent to get the patient back in for blood work some time next week.

## 2020-04-06 ENCOUNTER — Telehealth: Payer: Self-pay | Admitting: Hematology and Oncology

## 2020-04-06 NOTE — Telephone Encounter (Signed)
Scheduled appointment per 03/03 schedule message. Contacted patient, patient is aware.

## 2020-04-13 ENCOUNTER — Other Ambulatory Visit: Payer: Self-pay

## 2020-04-13 ENCOUNTER — Other Ambulatory Visit: Payer: Self-pay | Admitting: Radiology

## 2020-04-13 ENCOUNTER — Inpatient Hospital Stay: Payer: BC Managed Care – PPO

## 2020-04-13 DIAGNOSIS — D649 Anemia, unspecified: Secondary | ICD-10-CM | POA: Diagnosis not present

## 2020-04-13 LAB — CBC WITH DIFFERENTIAL/PLATELET
Abs Immature Granulocytes: 0.02 10*3/uL (ref 0.00–0.07)
Basophils Absolute: 0.1 10*3/uL (ref 0.0–0.1)
Basophils Relative: 1 %
Eosinophils Absolute: 0.2 10*3/uL (ref 0.0–0.5)
Eosinophils Relative: 3 %
HCT: 30.4 % — ABNORMAL LOW (ref 36.0–46.0)
Hemoglobin: 9.2 g/dL — ABNORMAL LOW (ref 12.0–15.0)
Immature Granulocytes: 0 %
Lymphocytes Relative: 29 %
Lymphs Abs: 2.4 10*3/uL (ref 0.7–4.0)
MCH: 24.4 pg — ABNORMAL LOW (ref 26.0–34.0)
MCHC: 30.3 g/dL (ref 30.0–36.0)
MCV: 80.6 fL (ref 80.0–100.0)
Monocytes Absolute: 0.8 10*3/uL (ref 0.1–1.0)
Monocytes Relative: 9 %
Neutro Abs: 4.7 10*3/uL (ref 1.7–7.7)
Neutrophils Relative %: 58 %
Platelets: 500 10*3/uL — ABNORMAL HIGH (ref 150–400)
RBC: 3.77 MIL/uL — ABNORMAL LOW (ref 3.87–5.11)
RDW: 14.5 % (ref 11.5–15.5)
WBC: 8.2 10*3/uL (ref 4.0–10.5)
nRBC: 0 % (ref 0.0–0.2)

## 2020-04-13 LAB — CMP (CANCER CENTER ONLY)
ALT: 20 U/L (ref 0–44)
AST: 48 U/L — ABNORMAL HIGH (ref 15–41)
Albumin: 3.3 g/dL — ABNORMAL LOW (ref 3.5–5.0)
Alkaline Phosphatase: 85 U/L (ref 38–126)
Anion gap: 8 (ref 5–15)
BUN: 15 mg/dL (ref 8–23)
CO2: 26 mmol/L (ref 22–32)
Calcium: 11.2 mg/dL — ABNORMAL HIGH (ref 8.9–10.3)
Chloride: 101 mmol/L (ref 98–111)
Creatinine: 1 mg/dL (ref 0.44–1.00)
GFR, Estimated: >60 mL/min (ref >60)
Glucose, Bld: 111 mg/dL — ABNORMAL HIGH (ref 70–99)
Potassium: 4.7 mmol/L (ref 3.5–5.1)
Sodium: 135 mmol/L (ref 135–145)
Total Bilirubin: 0.4 mg/dL (ref 0.3–1.2)
Total Protein: 7.8 g/dL (ref 6.5–8.1)

## 2020-04-16 ENCOUNTER — Encounter (HOSPITAL_COMMUNITY): Payer: Self-pay

## 2020-04-16 ENCOUNTER — Other Ambulatory Visit: Payer: Self-pay

## 2020-04-16 ENCOUNTER — Ambulatory Visit (HOSPITAL_COMMUNITY)
Admission: RE | Admit: 2020-04-16 | Discharge: 2020-04-16 | Disposition: A | Discharge status: Home / Self Care | Payer: BC Managed Care – PPO | Source: Ambulatory Visit | Attending: Hematology and Oncology | Admitting: Hematology and Oncology

## 2020-04-16 ENCOUNTER — Ambulatory Visit (HOSPITAL_COMMUNITY)
Admission: RE | Admit: 2020-04-16 | Discharge: 2020-04-16 | Disposition: A | Discharge status: Home / Self Care | Payer: BC Managed Care – PPO | Source: Ambulatory Visit

## 2020-04-16 DIAGNOSIS — D75839 Thrombocytosis, unspecified: Secondary | ICD-10-CM | POA: Insufficient documentation

## 2020-04-16 DIAGNOSIS — Z87891 Personal history of nicotine dependence: Secondary | ICD-10-CM | POA: Insufficient documentation

## 2020-04-16 DIAGNOSIS — D649 Anemia, unspecified: Secondary | ICD-10-CM | POA: Diagnosis present

## 2020-04-16 LAB — CBC WITH DIFFERENTIAL/PLATELET
Abs Immature Granulocytes: 0.04 10*3/uL (ref 0.00–0.07)
Basophils Absolute: 0.1 10*3/uL (ref 0.0–0.1)
Basophils Relative: 1 %
Eosinophils Absolute: 0.2 10*3/uL (ref 0.0–0.5)
Eosinophils Relative: 2 %
HCT: 32 % — ABNORMAL LOW (ref 36.0–46.0)
Hemoglobin: 9.5 g/dL — ABNORMAL LOW (ref 12.0–15.0)
Immature Granulocytes: 0 %
Lymphocytes Relative: 27 %
Lymphs Abs: 2.5 10*3/uL (ref 0.7–4.0)
MCH: 24.5 pg — ABNORMAL LOW (ref 26.0–34.0)
MCHC: 29.7 g/dL — ABNORMAL LOW (ref 30.0–36.0)
MCV: 82.7 fL (ref 80.0–100.0)
Monocytes Absolute: 0.8 10*3/uL (ref 0.1–1.0)
Monocytes Relative: 9 %
Neutro Abs: 5.7 10*3/uL (ref 1.7–7.7)
Neutrophils Relative %: 61 %
Platelets: 546 10*3/uL — ABNORMAL HIGH (ref 150–400)
RBC: 3.87 MIL/uL (ref 3.87–5.11)
RDW: 14.7 % (ref 11.5–15.5)
WBC: 9.2 10*3/uL (ref 4.0–10.5)
nRBC: 0 % (ref 0.0–0.2)

## 2020-04-16 LAB — PROTIME-INR
INR: 1.1 (ref 0.8–1.2)
Prothrombin Time: 14.1 s (ref 11.4–15.2)

## 2020-04-16 MED ORDER — SODIUM CHLORIDE 0.9 % IV SOLN: INTRAVENOUS | Status: DC

## 2020-04-16 MED ORDER — FLUMAZENIL 0.5 MG/5ML IV SOLN
INTRAVENOUS | Status: AC
  Filled 2020-04-16: qty 5

## 2020-04-16 MED ORDER — FENTANYL CITRATE (PF) 100 MCG/2ML IJ SOLN
INTRAMUSCULAR | Status: AC
  Filled 2020-04-16: qty 2

## 2020-04-16 MED ORDER — HYDROCODONE-ACETAMINOPHEN 5-325 MG PO TABS
1.0000 | ORAL_TABLET | ORAL | Status: DC | PRN
Start: 2020-04-16 — End: 2020-04-17

## 2020-04-16 MED ORDER — FENTANYL CITRATE (PF) 100 MCG/2ML IJ SOLN
INTRAMUSCULAR | Status: AC | PRN
  Administered 2020-04-16 (×2): 50 ug via INTRAVENOUS

## 2020-04-16 MED ORDER — LIDOCAINE HCL (PF) 1 % IJ SOLN
INTRAMUSCULAR | Status: AC | PRN
  Administered 2020-04-16: 10 mL via INTRADERMAL

## 2020-04-16 MED ORDER — MIDAZOLAM HCL 2 MG/2ML IJ SOLN
INTRAMUSCULAR | Status: AC
  Filled 2020-04-16: qty 4

## 2020-04-16 MED ORDER — MIDAZOLAM HCL 2 MG/2ML IJ SOLN
INTRAMUSCULAR | Status: AC | PRN
  Administered 2020-04-16 (×2): 1 mg via INTRAVENOUS

## 2020-04-16 MED ORDER — NALOXONE HCL 0.4 MG/ML IJ SOLN
INTRAMUSCULAR | Status: AC
  Filled 2020-04-16: qty 1

## 2020-04-16 NOTE — H&P (Signed)
Chief Complaint: Patient was seen in consultation today for bone marrow biopsy and aspiration  Referring Physician(s): Iruku, Arletha Pili  Supervising Physician: Ruthann Cancer  Patient Status: [facility] - Out-pt  History of Present Illness: [name] is a 61 y.o. female with a medical history significant for HTN and anemia. Baseline labs for anemia have progressed but the etiology is unclear. Per oncology notes, "TIBC not consistent with iron deficiency. B12 and folate labs normal. MBUS no monoclonal gammopathy, K/L ratio normal. Some hypercalcemia noted, repeat CMP ordered. Encouraged hydration. LDH elevated, retic count normal."   Interventional Radiology has been asked to evaluate this patient for an image-guided bone marrow biopsy and aspiration for further work up.   Past Medical History:  Diagnosis Date  . Hypertension   . Vaginal prolapse     Past Surgical History:  Procedure Laterality Date  . ABDOMINAL HYSTERECTOMY    . CYSTOCELE REPAIR    . RECTOCELE REPAIR    . TUBAL LIGATION      Allergies: Amlodipine and Crestor [rosuvastatin]  Medications: Prior to Admission medications   Medication Sig Start Date End Date Taking? Authorizing Provider  aspirin 81 MG tablet Take 81 mg by mouth daily.    [provider]  fenofibrate (TRICOR) 145 MG tablet Take 1 tablet by mouth daily. 10/19/13   [provider]  losartan-hydrochlorothiazide (HYZAAR) 100-25 MG tablet Take 1 tablet by mouth daily.    [provider]  Multiple Vitamin (ONE DAILY) tablet Take 1 tablet by mouth daily.    [provider]     Family History  Problem Relation Age of Onset  . COPD Mother   . Hypertension Mother   . Leukemia Father        chronic mylemonocytic leukemia  . Colon cancer Paternal Grandfather     Social History   Socioeconomic History  . Marital status: Married    Spouse name: Not on file  . Number of children: Not on file  . Years of  education: Not on file  . Highest education level: Not on file  Occupational History  . Not on file  Tobacco Use  . Smoking status: Former Smoker    Quit date: 11/12/1981    Years since quitting: 38.4  . Smokeless tobacco: Never Used  Substance and Sexual Activity  . Alcohol use: Yes  . Drug use: No  . Sexual activity: Not on file  Other Topics Concern  . Not on file  Social History Narrative  . Not on file   Social Determinants of Health   Financial Resource Strain: Not on file  Food Insecurity: Not on file  Transportation Needs: Not on file  Physical Activity: Not on file  Stress: Not on file  Social Connections: Not on file    Review of Systems: A 12 point ROS discussed and pertinent positives are indicated in the HPI above.  All other systems are negative.  Review of Systems  Constitutional: Positive for appetite change and fatigue.  Respiratory: Negative for cough and shortness of breath.   Cardiovascular: Negative for chest pain and leg swelling.  Gastrointestinal: Negative for abdominal pain, diarrhea, nausea and vomiting.  Musculoskeletal: Negative for back pain.  Neurological: Negative for dizziness and headaches.  Hematological: Does not bruise/bleed easily.    Vital Signs: BP (!) 123/55 (BP Location: Right Arm)   Pulse (!) 102   Temp 97.6 F (36.4 C) (Oral)   Resp 18   Ht 5' 4"  (1.626 m)  Wt 125 lb (56.7 kg)   SpO2 95%   BMI 21.46 kg/m   Physical Exam Constitutional:      General: She is not in acute distress. HENT:     Mouth/Throat:     Mouth: Mucous membranes are moist.     Pharynx: Oropharynx is clear.     Comments: Dentures upper Cardiovascular:     Rate and Rhythm: Normal rate and regular rhythm.     Pulses: Normal pulses.     Heart sounds: Normal heart sounds.  Pulmonary:     Effort: Pulmonary effort is normal.     Breath sounds: Normal breath sounds.  Abdominal:     General: Bowel sounds are normal.     Palpations: Abdomen is  soft.  Skin:    General: Skin is warm and dry.  Neurological:     Mental Status: She is alert and oriented to person, place, and time.  Psychiatric:        Mood and Affect: Mood normal.        Behavior: Behavior normal.        Thought Content: Thought content normal.        Judgment: Judgment normal.     Imaging: No results found.  Labs:  CBC: Recent Labs    03/22/20 1059 03/22/20 1100 04/04/20 1037 04/13/20 0758 04/16/20 0930  WBC 8.2  --  8.1 8.2 9.2  HGB 9.3*  --  8.8* 9.2* 9.5*  HCT 30.2* 30.4* 29.6* 30.4* 32.0*  PLT 440*  --  475* 500* 546*    COAGS: No results for input(s): INR, APTT in the last 8760 hours.  BMP: Recent Labs    03/22/20 1059 04/04/20 1037 04/13/20 0758  NA 133* 134* 135  K 4.3 4.5 4.7  CL 99 100 101  CO2 25 26 26   GLUCOSE 117* 148* 111*  BUN 17 16 15   CALCIUM 11.5* 11.1* 11.2*  CREATININE 1.11* 0.96 1.00  GFRNONAA 57* >60 >60    LIVER FUNCTION TESTS: Recent Labs    03/22/20 1059 04/04/20 1037 04/13/20 0758  BILITOT 0.4 0.4 0.4  AST 50* 50* 48*  ALT 19 22 20   ALKPHOS 81 74 85  PROT 7.9 7.4 7.8  ALBUMIN 3.3* 3.1* 3.3*    TUMOR MARKERS: No results for input(s): AFPTM, CEA, CA199, CHROMGRNA in the last 8760 hours.  Assessment and Plan:  Anemia: [name]. 5, 60 year old female, presents today to the [facility] department for an image-guided bone marrow biopsy and aspiration.   Risks and benefits of this procedure were discussed with the patient and/or patient's family including, but not limited to bleeding, infection, damage to adjacent structures or low yield requiring additional tests.  All of the questions were answered and there is agreement to proceed. She has been NPO. Labs and vitals have been reviewed.   Consent signed and in chart.  Thank you for this interesting consult.  I greatly enjoyed meeting [name] and look forward to participating in their care.  A copy of this  report was sent to the requesting provider on this date.  Electronically Signed: Soyla Dryer, AGACNP-BC [phone] 04/16/2020, 9:53 AM   I spent a total of  30 Minutes   in face to face in clinical consultation, greater than 50% of which was counseling/coordinating care for bone marrow biopsy and aspiration

## 2020-04-16 NOTE — Discharge Instructions (Signed)
Bone Marrow Aspiration and Bone Marrow Biopsy, Adult, Care After This sheet gives you information about how to care for yourself after your procedure. Your health care provider may also give you more specific instructions. If you have problems or questions, contact your health care provider. What can I expect after the procedure? After the procedure, it is common to have:  Mild pain and tenderness.  Swelling.  Bruising. Follow these instructions at home: Puncture site care  Follow instructions from your health care provider about how to take care of the puncture site. Make sure you: ? Wash your hands with soap and water before and after you change your bandage (dressing). If soap and water are not available, use hand sanitizer. ? Change your dressing as told by your health care provider.  Check your puncture site every day for signs of infection. Check for: ? More redness, swelling, or pain. ? Fluid or blood. ? Warmth. ? Pus or a bad smell.   Activity  Return to your normal activities as told by your health care provider. Ask your health care provider what activities are safe for you.  Do not lift anything that is heavier than 10 lb (4.5 kg), or the limit that you are told, until your health care provider says that it is safe.  Do not drive for 24 hours if you were given a sedative during your procedure. General instructions  Take over-the-counter and prescription medicines only as told by your health care provider.  Do not take baths, swim, or use a hot tub until your health care provider approves. Ask your health care provider if you may take showers. You may only be allowed to take sponge baths.  If directed, put ice on the affected area. To do this: ? Put ice in a plastic bag. ? Place a towel between your skin and the bag. ? Leave the ice on for 20 minutes, 2-3 times a day.  Keep all follow-up visits as told by your health care provider. This is important.   Contact a  health care provider if:  Your pain is not controlled with medicine.  You have a fever.  You have more redness, swelling, or pain around the puncture site.  You have fluid or blood coming from the puncture site.  Your puncture site feels warm to the touch.  You have pus or a bad smell coming from the puncture site. Summary  After the procedure, it is common to have mild pain, tenderness, swelling, and bruising.  Follow instructions from your health care provider about how to take care of the puncture site and what activities are safe for you.  Take over-the-counter and prescription medicines only as told by your health care provider.  Contact a health care provider if you have any signs of infection, such as fluid or blood coming from the puncture site. This information is not intended to replace advice given to you by your health care provider. Make sure you discuss any questions you have with your health care provider.    Moderate Conscious Sedation, Adult, Care After This sheet gives you information about how to care for yourself after your procedure. Your health care provider may also give you more specific instructions. If you have problems or questions, contact your health care provider. What can I expect after the procedure? After the procedure, it is common to have:  Sleepiness for several hours.  Impaired judgment for several hours.  Difficulty with balance.  Vomiting if you eat   too soon. Follow these instructions at home: For the time period you were told by your health care provider:  Rest.  Do not participate in activities where you could fall or become injured.  Do not drive or use machinery.  Do not drink alcohol.  Do not take sleeping pills or medicines that cause drowsiness.  Do not make important decisions or sign legal documents.  Do not take care of children on your own.      Eating and drinking  Follow the diet recommended by your health  care provider.  Drink enough fluid to keep your urine pale yellow.  If you vomit: ? Drink water, juice, or soup when you can drink without vomiting. ? Make sure you have little or no nausea before eating solid foods.   General instructions  Take over-the-counter and prescription medicines only as told by your health care provider.  Have a responsible adult stay with you for the time you are told. It is important to have someone help care for you until you are awake and alert.  Do not smoke.  Keep all follow-up visits as told by your health care provider. This is important. Contact a health care provider if:  You are still sleepy or having trouble with balance after 24 hours.  You feel light-headed.  You keep feeling nauseous or you keep vomiting.  You develop a rash.  You have a fever.  You have redness or swelling around the IV site. Get help right away if:  You have trouble breathing.  You have new-onset confusion at home. Summary  After the procedure, it is common to feel sleepy, have impaired judgment, or feel nauseous if you eat too soon.  Rest after you get home. Know the things you should not do after the procedure.  Follow the diet recommended by your health care provider and drink enough fluid to keep your urine pale yellow.  Get help right away if you have trouble breathing or new-onset confusion at home. This information is not intended to replace advice given to you by your health care provider. Make sure you discuss any questions you have with your health care provider. Document Revised: 05/20/2019 Document Reviewed: 12/16/2018 Elsevier Patient Education  2021 [employer]  

## 2020-04-16 NOTE — Procedures (Signed)
Interventional Radiology Procedure Note  Procedure: CT guided aspirate and core biopsy of right iliac bone  Complications: None  Recommendations: - Bedrest supine x 1 hrs - Hydrocodone PRN  Pain - Follow biopsy results   Christina Hable, MD   

## 2020-04-18 LAB — SURGICAL PATHOLOGY

## 2020-04-19 ENCOUNTER — Other Ambulatory Visit: Payer: Self-pay | Admitting: Physician Assistant

## 2020-04-19 DIAGNOSIS — R10816 Epigastric abdominal tenderness: Secondary | ICD-10-CM

## 2020-04-19 DIAGNOSIS — R634 Abnormal weight loss: Secondary | ICD-10-CM

## 2020-04-23 ENCOUNTER — Encounter: Payer: Self-pay | Admitting: Hematology and Oncology

## 2020-04-23 ENCOUNTER — Inpatient Hospital Stay: Payer: BC Managed Care – PPO | Admitting: Hematology and Oncology

## 2020-04-23 ENCOUNTER — Other Ambulatory Visit: Payer: Self-pay

## 2020-04-23 VITALS — BP 145/55 | HR 89 | Temp 97.6°F | Resp 14 | Wt 132.2 lb

## 2020-04-23 DIAGNOSIS — R7402 Elevation of levels of lactic acid dehydrogenase (LDH): Secondary | ICD-10-CM | POA: Diagnosis not present

## 2020-04-23 DIAGNOSIS — D649 Anemia, unspecified: Secondary | ICD-10-CM

## 2020-04-23 DIAGNOSIS — R634 Abnormal weight loss: Secondary | ICD-10-CM

## 2020-04-23 NOTE — Assessment & Plan Note (Addendum)
Review of labs over the past several years shows long standing anemia which may have progressed. Ferritin of 900, TIBC not consistent with iron deficiency. B12 and folate labs normal. 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 Some hypercalcemia noted, repeat CMP ordered. Encouraged hydration. LDH elevated, retic count normal. She is to discuss bmb results  Biopsy negative without any evidence of dysplasia or increased blasts.  Normal cellular bone marrow with trilineage hematopoiesis with maturation.  Flow negative. At this time there is no clear evidence of myeloma, hemolysis normal bone marrow findings.  I recommended that she proceed with endoscopy and colonoscopy as planned.she will also: CT abdomen illness.  Given elevated LDH loss, also had a CT chest with contrast.  I recommended patient discuss with scheduled radiology to make sure CT chest was also done at the same time. We will also try to help her with this.

## 2020-04-23 NOTE — Progress Notes (Signed)
[facility] NOTE  Patient Care Team: Josetta Huddle, MD as PCP - General (Internal Medicine)  CHIEF COMPLAINTS/PURPOSE OF CONSULTATION:  Anemia.  ASSESSMENT & PLAN:   Normocytic normochromic anemia Review of labs over the past several years shows long standing anemia which may have progressed. Ferritin of 900, TIBC not consistent with iron deficiency. B12 and folate labs normal. 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 Some hypercalcemia noted, repeat CMP ordered. Encouraged hydration. LDH elevated, retic count normal. She is to discuss bmb results  Biopsy negative without any evidence of dysplasia or increased blasts.  Normal cellular bone marrow with trilineage hematopoiesis with maturation.  Flow negative. At this time there is no clear evidence of myeloma, hemolysis normal bone marrow findings.  I recommended that she proceed with endoscopy and colonoscopy as planned.she will also: CT abdomen illness.  Given elevated LDH loss, also had a CT chest with contrast.  I recommended patient discuss with scheduled radiology to make sure CT chest was also done at the same time. We will also try to help her with this.    Weight loss, abnormal She cannot define the amount of weight loss but may have lost about 10 to 15 pounds over the past year and part of this could be intentional. Given her elevated LDH and weight loss that she reported, recommended also consider CT chest imaging along with CT abdomen pelvis.  She expressed understanding and she is okay with this.  HISTORY OF PRESENTING ILLNESS:   [name] 60 y.o. female is here because of anemia.  [name] is here for a FU with her sister.  She is a good historian.   Since last visit, no major change in complaints, continues to feel tires and now has no appetite. Weight loss, she is not exactly sure of how much is intentional and how much is non intentional. Previously mentioned that she probably  lost about 5-10 lbs, today says it could have been more.  No other interim changes, she denied any hematochezia or melena before. She is overdue for colonoscopy, has been 11 years since her last one.  No known family history of colon cancer.  She is up-to-date with her other age-appropriate cancer screening.   Interim History  Since last visit, she has been feeling pretty much the same.  She continues to feel tired but able to eat better.  She has gained about 10 pounds since her last visit.  She continues to deny any change in her bowel habits. She had her bone marrow biopsy and has noticed some numbness at the site of her biopsy. Rest of the pertinent 10 point ROS reviewed and negative.   MEDICAL HISTORY:  Past Medical History:  Diagnosis Date  . Hypertension   . Vaginal prolapse     SURGICAL HISTORY: Past Surgical History:  Procedure Laterality Date  . ABDOMINAL HYSTERECTOMY    . CYSTOCELE REPAIR    . RECTOCELE REPAIR    . TUBAL LIGATION      SOCIAL HISTORY: Social History   Socioeconomic History  . Marital status: Married    Spouse name: Not on file  . Number of children: Not on file  . Years of education: Not on file  . Highest education level: Not on file  Occupational History  . Not on file  Tobacco Use  . Smoking status: Former Smoker    Quit date: 11/12/1981    Years since quitting: 38.4  . Smokeless tobacco: Never  Used  Substance and Sexual Activity  . Alcohol use: Yes  . Drug use: No  . Sexual activity: Not on file  Other Topics Concern  . Not on file  Social History Narrative  . Not on file   Social Determinants of Health   Financial Resource Strain: Not on file  Food Insecurity: Not on file  Transportation Needs: Not on file  Physical Activity: Not on file  Stress: Not on file  Social Connections: Not on file  Intimate Partner Violence: Not on file    FAMILY HISTORY: Family History  Problem Relation Age of Onset  . COPD Mother   .  Hypertension Mother   . Leukemia Father        chronic mylemonocytic leukemia  . Colon cancer Paternal Grandfather     ALLERGIES:  is allergic to amlodipine and crestor [rosuvastatin].  MEDICATIONS:  Current Outpatient Medications  Medication Sig Dispense Refill  . aspirin 81 MG tablet Take 81 mg by mouth daily.    . fenofibrate (TRICOR) 145 MG tablet Take 1 tablet by mouth daily.    Marland Kitchen losartan-hydrochlorothiazide (HYZAAR) 100-25 MG tablet Take 1 tablet by mouth daily.    . Multiple Vitamin (ONE DAILY) tablet Take 1 tablet by mouth daily.     No current facility-administered medications for this visit.     PHYSICAL EXAMINATION: ECOG PERFORMANCE STATUS: 0 - Asymptomatic  Vitals:   04/23/20 0926  BP: (!) 145/55  Pulse: 89  Resp: 14  Temp: 97.6 F (36.4 C)  SpO2: 100%   Filed Weights   04/23/20 0926  Weight: 132 lb 3.2 oz (60 kg)    GENERAL:alert, no distress and comfortable SKIN: skin color, texture, turgor are normal, no rashes or significant lesions EYES: normal, conjunctiva are pink and non-injected, sclera clear OROPHARYNX:no exudate, no erythema and lips, buccal mucosa, and tongue normal  NECK: supple, thyroid normal size, non-tender, without nodularity LYMPH:  no palpable lymphadenopathy in the cervical, axillary LUNGS: clear to auscultation and percussion with normal breathing effort HEART: regular rate & rhythm and no murmurs and no lower extremity edema ABDOMEN:abdomen soft, non-tender and normal bowel sounds Musculoskeletal:no cyanosis of digits and no clubbing  PSYCH: alert & oriented x 3 with fluent speech NEURO: no focal motor/sensory deficits  LABORATORY DATA:  I have reviewed the data as listed Lab Results  Component Value Date   WBC 9.2 04/16/2020   HGB 9.5 (L) 04/16/2020   HCT 32.0 (L) 04/16/2020   MCV 82.7 04/16/2020   PLT 546 (H) 04/16/2020     Chemistry      Component Value Date/Time   NA 135 04/13/2020 0758   K 4.7 04/13/2020 0758    CL 101 04/13/2020 0758   CO2 26 04/13/2020 0758   BUN 15 04/13/2020 0758   CREATININE 1.00 04/13/2020 0758      Component Value Date/Time   CALCIUM 11.2 (H) 04/13/2020 0758   ALKPHOS 85 04/13/2020 0758   AST 48 (H) 04/13/2020 0758   ALT 20 04/13/2020 0758   BILITOT 0.4 04/13/2020 0758     Reviewed most recent labs. No evidence of nutritional deficiency No MGUS LDH high but retic count normal.  RADIOGRAPHIC STUDIES: I have personally reviewed the radiological images as listed and agreed with the findings in the report. CT BIOPSY  Result Date: 04/17/2020 INDICATION: 60 year old female with normocytic anemia. EXAM: CT-GUIDED BONE MARROW BIOPSY AND ASPIRATION MEDICATIONS: None ANESTHESIA/SEDATION: Fentanyl 100 mcg IV; Versed 2 mg IV Sedation Time: 10 minutes;  The patient was continuously monitored during the procedure by the interventional radiology nurse under my direct supervision. COMPLICATIONS: None immediate. PROCEDURE: Informed consent was obtained from the patient following an explanation of the procedure, risks, benefits and alternatives. The patient understands, agrees and consents for the procedure. All questions were addressed. A time out was performed prior to the initiation of the procedure. The patient was positioned prone and non-contrast localization CT was performed of the pelvis to demonstrate the iliac marrow spaces. The operative site was prepped and draped in the usual sterile fashion. Under sterile conditions and local anesthesia, a 22 gauge spinal needle was utilized for procedural planning. Next, an 11 gauge coaxial bone biopsy needle was advanced into the right iliac marrow space. Needle position was confirmed with CT imaging. Initially, a bone marrow aspiration was performed. Next, a bone marrow biopsy was obtained with the 11 gauge outer bone marrow device. Samples were prepared with the cytotechnologist and deemed adequate. The needle was removed and superficial  hemostasis was obtained with manual compression. A dressing was applied. The patient tolerated the procedure well without immediate post procedural complication. IMPRESSION: Successful CT guided right iliac bone marrow aspiration and core biopsy. Ruthann Cancer, MD [facility] Radiology Electronically Signed   By: Ruthann Cancer MD   On: 04/16/2020 11:17   CT BONE MARROW BIOPSY & ASPIRATION  Result Date: 04/16/2020 INDICATION: 60 year old female with normocytic anemia. EXAM: CT-GUIDED BONE MARROW BIOPSY AND ASPIRATION MEDICATIONS: None ANESTHESIA/SEDATION: Fentanyl 100 mcg IV; Versed 2 mg IV Sedation Time: 10 minutes; The patient was continuously monitored during the procedure by the interventional radiology nurse under my direct supervision. COMPLICATIONS: None immediate. PROCEDURE: Informed consent was obtained from the patient following an explanation of the procedure, risks, benefits and alternatives. The patient understands, agrees and consents for the procedure. All questions were addressed. A time out was performed prior to the initiation of the procedure. The patient was positioned prone and non-contrast localization CT was performed of the pelvis to demonstrate the iliac marrow spaces. The operative site was prepped and draped in the usual sterile fashion. Under sterile conditions and local anesthesia, a 22 gauge spinal needle was utilized for procedural planning. Next, an 11 gauge coaxial bone biopsy needle was advanced into the right iliac marrow space. Needle position was confirmed with CT imaging. Initially, a bone marrow aspiration was performed. Next, a bone marrow biopsy was obtained with the 11 gauge outer bone marrow device. Samples were prepared with the cytotechnologist and deemed adequate. The needle was removed and superficial hemostasis was obtained with manual compression. A dressing was applied. The patient tolerated the procedure well  without immediate post procedural complication. IMPRESSION: Successful CT guided right iliac bone marrow aspiration and core biopsy. Ruthann Cancer, MD [facility] Electronically Signed   By: Ruthann Cancer MD   On: 04/16/2020 11:17    04/16/2020    DIAGNOSIS:   BONE MARROW; FLOW CYTOMETRIC ANALYSIS:  - No monoclonal B-cell or immunophenotypically aberrant T-cell  population identified  - No increase in blasts  - See comment    COMMENT:   Correlation with comprehensive bone marrow evaluation is recommended for  complete interpretation and overall blast enumeration (see [id number]).    GATING AND PHENOTYPIC ANALYSIS:   Gated population: Flow cytometric immunophenotyping is performed using  antibodies to the antigens listed in the table below. Electronic gates  are placed around a cell cluster displaying light scatter properties  corresponding to: lymphocytes   Abnormal Cells in gated population: N/A   Phenotype of Abnormal Cells: N/A   Clinical History: normocytic anemia     Surgical Pathology  DIAGNOSIS:   BONE MARROW, ASPIRATE, CLOT, CORE:  - Normocellular bone marrow with trilineage hematopoiesis with  maturation  - See comment   PERIPHERAL BLOOD:  - Normocytic anemia  - Thrombocytosis  - See CBC data    COMMENT:   There is no significant dysplasia or increase in blasts. No ring  sideroblasts are identified on a Prussian blue stain of an aspirate  smear. There is no morphologic or flow cytometric evidence of an  atypical lymphoid population. Plasma cells are not increased by CD138  immunohistochemistry and show polytypic light chain expression by  kappa/lambda in situ hybridization. Correlation with clinical findings,  other laboratory data including CBC trends, and cytogenetic results is  recommended.    Benay Pike, MD 04/23/2020 10:23 AM  Time I spent 30 minutes in the care of  this patient including history, review of medical records, physical exam, counseling, coordination of care and documentation  Benay Pike MD

## 2020-04-23 NOTE — Assessment & Plan Note (Signed)
She cannot define the amount of weight loss but may have lost about 10 to 15 pounds over the past year and part of this could be intentional. Given her elevated LDH and weight loss that she reported, recommended also consider CT chest imaging along with CT abdomen pelvis.  She expressed understanding and she is okay with this.

## 2020-04-24 ENCOUNTER — Encounter (HOSPITAL_COMMUNITY): Payer: Self-pay | Admitting: Hematology and Oncology

## 2020-04-30 ENCOUNTER — Other Ambulatory Visit: Payer: Self-pay | Admitting: Hematology and Oncology

## 2020-05-02 ENCOUNTER — Other Ambulatory Visit: Payer: Self-pay | Admitting: Hematology and Oncology

## 2020-05-02 LAB — SURGICAL PATHOLOGY

## 2020-05-02 NOTE — Progress Notes (Signed)
I called the insurance, they said the request is denied at this time and there is no option to do peer to peer review. Since patient has no complaints concerning for lung cancer, I dont know if an appeal will be successful. She will proceed with CT abdomen as requested by her gastroenterologist. We will see her for a FU.  Mathhew Buysse

## 2020-05-04 ENCOUNTER — Telehealth: Payer: Self-pay | Admitting: Hematology and Oncology

## 2020-05-04 ENCOUNTER — Other Ambulatory Visit: Payer: Self-pay | Admitting: Hematology and Oncology

## 2020-05-04 ENCOUNTER — Ambulatory Visit
Admission: RE | Admit: 2020-05-04 | Discharge: 2020-05-04 | Disposition: A | Discharge status: Home / Self Care | Payer: BC Managed Care – PPO | Source: Ambulatory Visit | Attending: Physician Assistant | Admitting: Physician Assistant

## 2020-05-04 ENCOUNTER — Other Ambulatory Visit: Payer: Self-pay

## 2020-05-04 ENCOUNTER — Inpatient Hospital Stay: Admission: RE | Admit: 2020-05-04 | Payer: BC Managed Care – PPO | Source: Ambulatory Visit

## 2020-05-04 DIAGNOSIS — R634 Abnormal weight loss: Secondary | ICD-10-CM

## 2020-05-04 DIAGNOSIS — R10816 Epigastric abdominal tenderness: Secondary | ICD-10-CM

## 2020-05-04 MED ORDER — APIXABAN (ELIQUIS) VTE STARTER PACK (10MG AND 5MG): ORAL_TABLET | ORAL | 0 refills | Status: DC

## 2020-05-04 MED ORDER — IOPAMIDOL (ISOVUE-300) INJECTION 61%
100.0000 mL | Freq: Once | INTRAVENOUS | Status: AC | PRN
  Administered 2020-05-04: 100 mL via INTRAVENOUS

## 2020-05-04 NOTE — Progress Notes (Signed)
I was sent a secure chat message by Hyman Hopes to discuss CT abdomen results It appears that her CT abdomen is concerning for advanced RCC. I called all numbers on file no answer Sent a high priority message to our scheduling team to schedule her on Monday morning at 8:40 AM.  Arletha Pili Nedim Oki

## 2020-05-04 NOTE — Telephone Encounter (Signed)
Scheduled appt per 4/1 sch msg. Pt's husband is aware.

## 2020-05-04 NOTE — Progress Notes (Signed)
I called the patient regarding the blood clot noted on the CT. I recommended starting blood thinners ASAP Discussed increased risk of bleeding with blood thinners on board, but benefits of blood thinners will overweigh the risk She doesn't have any SOB or chest pain, she feels well, she is overwhelmed with the results of the scan. I encouraged her to refill the blood thinner today and to go to the ED with any worsening or new symptoms. She expressed understanding of the recommendations.

## 2020-05-07 ENCOUNTER — Other Ambulatory Visit: Payer: Self-pay

## 2020-05-07 ENCOUNTER — Inpatient Hospital Stay: Payer: BC Managed Care – PPO | Attending: Hematology and Oncology | Admitting: Hematology and Oncology

## 2020-05-07 ENCOUNTER — Inpatient Hospital Stay: Payer: BC Managed Care – PPO

## 2020-05-07 ENCOUNTER — Telehealth: Payer: Self-pay | Admitting: Hematology and Oncology

## 2020-05-07 ENCOUNTER — Encounter: Payer: Self-pay | Admitting: Hematology and Oncology

## 2020-05-07 VITALS — BP 125/55 | HR 96 | Temp 96.4°F | Resp 18 | Wt 129.2 lb

## 2020-05-07 DIAGNOSIS — C7800 Secondary malignant neoplasm of unspecified lung: Secondary | ICD-10-CM | POA: Insufficient documentation

## 2020-05-07 DIAGNOSIS — R634 Abnormal weight loss: Secondary | ICD-10-CM | POA: Insufficient documentation

## 2020-05-07 DIAGNOSIS — I10 Essential (primary) hypertension: Secondary | ICD-10-CM | POA: Diagnosis not present

## 2020-05-07 DIAGNOSIS — C787 Secondary malignant neoplasm of liver and intrahepatic bile duct: Secondary | ICD-10-CM | POA: Insufficient documentation

## 2020-05-07 DIAGNOSIS — N2889 Other specified disorders of kidney and ureter: Secondary | ICD-10-CM

## 2020-05-07 DIAGNOSIS — I2510 Atherosclerotic heart disease of native coronary artery without angina pectoris: Secondary | ICD-10-CM | POA: Insufficient documentation

## 2020-05-07 DIAGNOSIS — Z8673 Personal history of transient ischemic attack (TIA), and cerebral infarction without residual deficits: Secondary | ICD-10-CM | POA: Insufficient documentation

## 2020-05-07 DIAGNOSIS — Z79899 Other long term (current) drug therapy: Secondary | ICD-10-CM | POA: Diagnosis not present

## 2020-05-07 DIAGNOSIS — K5900 Constipation, unspecified: Secondary | ICD-10-CM | POA: Diagnosis not present

## 2020-05-07 DIAGNOSIS — D649 Anemia, unspecified: Secondary | ICD-10-CM | POA: Insufficient documentation

## 2020-05-07 DIAGNOSIS — I700 Atherosclerosis of aorta: Secondary | ICD-10-CM | POA: Insufficient documentation

## 2020-05-07 DIAGNOSIS — Z86711 Personal history of pulmonary embolism: Secondary | ICD-10-CM | POA: Insufficient documentation

## 2020-05-07 DIAGNOSIS — Z87891 Personal history of nicotine dependence: Secondary | ICD-10-CM | POA: Insufficient documentation

## 2020-05-07 DIAGNOSIS — Z7901 Long term (current) use of anticoagulants: Secondary | ICD-10-CM | POA: Diagnosis not present

## 2020-05-07 DIAGNOSIS — C641 Malignant neoplasm of right kidney, except renal pelvis: Secondary | ICD-10-CM | POA: Insufficient documentation

## 2020-05-07 DIAGNOSIS — R0602 Shortness of breath: Secondary | ICD-10-CM | POA: Diagnosis not present

## 2020-05-07 DIAGNOSIS — R630 Anorexia: Secondary | ICD-10-CM | POA: Diagnosis not present

## 2020-05-07 DIAGNOSIS — C649 Malignant neoplasm of unspecified kidney, except renal pelvis: Secondary | ICD-10-CM | POA: Insufficient documentation

## 2020-05-07 DIAGNOSIS — I2699 Other pulmonary embolism without acute cor pulmonale: Secondary | ICD-10-CM | POA: Diagnosis not present

## 2020-05-07 LAB — CBC WITH DIFFERENTIAL/PLATELET
Abs Immature Granulocytes: 0.04 10*3/uL (ref 0.00–0.07)
Basophils Absolute: 0.1 10*3/uL (ref 0.0–0.1)
Basophils Relative: 1 %
Eosinophils Absolute: 0.2 10*3/uL (ref 0.0–0.5)
Eosinophils Relative: 2 %
HCT: 29.4 % — ABNORMAL LOW (ref 36.0–46.0)
Hemoglobin: 8.8 g/dL — ABNORMAL LOW (ref 12.0–15.0)
Immature Granulocytes: 1 %
Lymphocytes Relative: 22 %
Lymphs Abs: 1.8 10*3/uL (ref 0.7–4.0)
MCH: 24.2 pg — ABNORMAL LOW (ref 26.0–34.0)
MCHC: 29.9 g/dL — ABNORMAL LOW (ref 30.0–36.0)
MCV: 81 fL (ref 80.0–100.0)
Monocytes Absolute: 0.7 10*3/uL (ref 0.1–1.0)
Monocytes Relative: 8 %
Neutro Abs: 5.4 10*3/uL (ref 1.7–7.7)
Neutrophils Relative %: 66 %
Platelets: 492 10*3/uL — ABNORMAL HIGH (ref 150–400)
RBC: 3.63 MIL/uL — ABNORMAL LOW (ref 3.87–5.11)
RDW: 14.6 % (ref 11.5–15.5)
WBC: 8.1 10*3/uL (ref 4.0–10.5)
nRBC: 0 % (ref 0.0–0.2)

## 2020-05-07 LAB — CMP (CANCER CENTER ONLY)
ALT: 21 U/L (ref 0–44)
AST: 45 U/L — ABNORMAL HIGH (ref 15–41)
Albumin: 3.1 g/dL — ABNORMAL LOW (ref 3.5–5.0)
Alkaline Phosphatase: 88 U/L (ref 38–126)
Anion gap: 12 (ref 5–15)
BUN: 16 mg/dL (ref 8–23)
CO2: 24 mmol/L (ref 22–32)
Calcium: 10.8 mg/dL — ABNORMAL HIGH (ref 8.9–10.3)
Chloride: 99 mmol/L (ref 98–111)
Creatinine: 0.94 mg/dL (ref 0.44–1.00)
GFR, Estimated: >60 mL/min (ref >60)
Glucose, Bld: 124 mg/dL — ABNORMAL HIGH (ref 70–99)
Potassium: 4.4 mmol/L (ref 3.5–5.1)
Sodium: 135 mmol/L (ref 135–145)
Total Bilirubin: 0.4 mg/dL (ref 0.3–1.2)
Total Protein: 7.3 g/dL (ref 6.5–8.1)

## 2020-05-07 LAB — LACTATE DEHYDROGENASE: LDH: 749 U/L — ABNORMAL HIGH (ref 98–192)

## 2020-05-07 MED ORDER — ENOXAPARIN SODIUM 60 MG/0.6ML ~~LOC~~ SOLN: 60.0000 mg | Freq: Two times a day (BID) | SUBCUTANEOUS | Status: DC

## 2020-05-07 MED ORDER — ENOXAPARIN SODIUM 30 MG/0.3ML ~~LOC~~ SOLN: 60.0000 mg | Freq: Two times a day (BID) | SUBCUTANEOUS | 0 refills | Status: DC

## 2020-05-07 NOTE — Assessment & Plan Note (Signed)
Likely related to metastatic disease. Will consider referral to nutrition once all procedures are complete Can likely improve with treatment.

## 2020-05-07 NOTE — Assessment & Plan Note (Signed)
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 We discussed about proceeding with biopsy asap and if this is indeed metastatic renal clear cell histology, given tumor burden and likely poor risk per [facility] criteria, we discussed about first line axitinib plus pembrolizumab vs combination immunotherapy. We have briefly discussed mechanism of action of immunotherapy and possible adverse effects such as fatigue, diarrhea, hypo/hyper thyroidism, hepatitis, nephritis and very rarely can cause fatal adverse effects in about 2% of patients. We have discussed that this is advanced RCC and is non curable, intent of treatment is to prolong life and palliate her symptoms. If she were to respond well to combination immunotherapy, 18 month OS was about 75% She is agreeable to biopsy Gave bridging instructions as well. She should take lovenox on D -2 and D-1 ( AM ) of the procedure, skip the evening dose of lovenox day before procedure and skip the morning dose on the day of procedure. We will order MRI brain since patient has some speech issues and memory issues lately.

## 2020-05-07 NOTE — Assessment & Plan Note (Signed)
Related to metastatic RCC No indication for transfusion

## 2020-05-07 NOTE — Assessment & Plan Note (Signed)
No cough, chest pain or SOB On eliquis, tolerating it well.

## 2020-05-07 NOTE — Telephone Encounter (Signed)
Scheduled follow-up appointment per 4/4 los. Patient is aware.

## 2020-05-07 NOTE — Progress Notes (Signed)
[place] NOTE  Patient Care Team: Josetta Huddle, MD as PCP - General (Internal Medicine)  CHIEF COMPLAINTS/PURPOSE OF CONSULTATION:   Anemia/kidney mass  ASSESSMENT & PLAN:   Metastatic renal cell carcinoma ([place]) We have reviewed imaging results which showed large right renal mass with renal venous extension, direct hepatic invasion, hepatic and pulmonary metastatic disease reflecting likely metastatic renal cell carcinoma. We discussed about proceeding with biopsy asap and if this is indeed metastatic renal clear cell histology, given tumor burden and likely poor risk per [facility] criteria, we discussed about first line axitinib plus pembrolizumab vs combination immunotherapy. We have briefly discussed mechanism of action of immunotherapy and possible adverse effects such as fatigue, diarrhea, hypo/hyper thyroidism, hepatitis, nephritis and very rarely can cause fatal adverse effects in about 2% of patients. We have discussed that this is advanced RCC and is non curable, intent of treatment is to prolong life and palliate her symptoms. If she were to respond well to combination immunotherapy, 18 month OS was about 75% She is agreeable to biopsy Gave bridging instructions as well. She should take lovenox on D -2 and D-1 ( AM ) of the procedure, skip the evening dose of lovenox day before procedure and skip the morning dose on the day of procedure. We will order MRI brain since patient has some speech issues and memory issues lately.  Weight loss, abnormal Likely related to metastatic disease. Will consider referral to nutrition once all procedures are complete Can likely improve with treatment.  Normocytic normochromic anemia Related to metastatic RCC No indication for transfusion  Pulmonary embolism (HCC) No cough, chest pain or SOB On eliquis, tolerating it well.  HISTORY OF PRESENTING ILLNESS:   ALAIYAH BOLLMAN 60 y.o. female was initially referred to [place]  for evaluation of anemia. She had no nutritional deficiencies. Ferritin is 900, B12 and folate levels are normal. No evidence of MGUS or MM> BMB negative without any evidence of dysplasia or increased blasts.Normal cellular bone marrow with trilineage hematopoiesis with maturation.  Flow negative.  We recommended proceeding with GI evaluation for further management of anemia. As a part of this work up, we recommended imaging of the abdomen, CT abdomen showed large right renal mass with renal venous extension, direct hepatic invasion, hepatic and pulmonary metastatic disease likely reflecting renal cell carcinoma.  She is here to discuss about CT imaging results and treatment recommendations. She is here with her sister, her niece is on face time from [place]. She is obviously very overwhelmed with information.  She denies any new complaints, some numbness at the bone marrow site, poor appetite, feeling tired, has noticed some weight loss. Sister also mentions some fogginess of brain and trouble finding the right words for speech at times No cough, chest pain or SOB. She started Eliquis and is tolerating it well.  MEDICAL HISTORY:  Past Medical History:  Diagnosis Date  . Hypertension   . Vaginal prolapse     SURGICAL HISTORY: Past Surgical History:  Procedure Laterality Date  . ABDOMINAL HYSTERECTOMY    . CYSTOCELE REPAIR    . RECTOCELE REPAIR    . TUBAL LIGATION      SOCIAL HISTORY: Social History   Socioeconomic History  . Marital status: Married    Spouse name: Not on file  . Number of children: Not on file  . Years of education: Not on file  . Highest education level: Not on file  Occupational History  . Not on file  Tobacco Use  . Smoking status: Former Smoker    Quit date: 11/12/1981    Years since quitting: 38.5  . Smokeless tobacco: Never Used  Substance and Sexual Activity  . Alcohol use: Yes  . Drug use: No  . Sexual activity: Not on file  Other Topics  Concern  . Not on file  Social History Narrative  . Not on file   Social Determinants of Health   Financial Resource Strain: Not on file  Food Insecurity: Not on file  Transportation Needs: Not on file  Physical Activity: Not on file  Stress: Not on file  Social Connections: Not on file  Intimate Partner Violence: Not on file    FAMILY HISTORY: Family History  Problem Relation Age of Onset  . COPD Mother   . Hypertension Mother   . Leukemia Father        chronic mylemonocytic leukemia  . Colon cancer Paternal Grandfather     ALLERGIES:  is allergic to amlodipine and crestor [rosuvastatin].  MEDICATIONS:  Current Outpatient Medications  Medication Sig Dispense Refill  . APIXABAN (ELIQUIS) VTE STARTER PACK (10MG AND 5MG) Take as directed on package: start with two-62m tablets twice daily for 7 days. On day 8, switch to one-560mtablet twice daily. 1 each 0  . fenofibrate (TRICOR) 145 MG tablet Take 1 tablet by mouth daily.    . Marland Kitchenosartan-hydrochlorothiazide (HYZAAR) 100-25 MG tablet Take 1 tablet by mouth daily.    . Multiple Vitamin (ONE DAILY) tablet Take 1 tablet by mouth daily.     Current Facility-Administered Medications  Medication Dose Route Frequency Provider Last Rate Last Admin  . enoxaparin (LOVENOX) injection 60 mg  60 mg Subcutaneous BID Natausha Jungwirth, MD         PHYSICAL EXAMINATION: ECOG PERFORMANCE STATUS: 0 - Asymptomatic  Vitals:   05/07/20 0856  BP: (!) 125/55  Pulse: 96  Resp: 18  Temp: (!) 96.4 F (35.8 C)  SpO2: 100%   Filed Weights   05/07/20 0856  Weight: 129 lb 3.2 oz (58.6 kg)    PE deferred today in lieu of counseling.  LABORATORY DATA:  I have reviewed the data as listed Lab Results  Component Value Date   WBC 8.1 05/07/2020   HGB 8.8 (L) 05/07/2020   HCT 29.4 (L) 05/07/2020   MCV 81.0 05/07/2020   PLT 492 (H) 05/07/2020     Chemistry      Component Value Date/Time   NA 135 04/13/2020 0758   K 4.7 04/13/2020 0758    CL 101 04/13/2020 0758   CO2 26 04/13/2020 0758   BUN 15 04/13/2020 0758   CREATININE 1.00 04/13/2020 0758      Component Value Date/Time   CALCIUM 11.2 (H) 04/13/2020 0758   ALKPHOS 85 04/13/2020 0758   AST 48 (H) 04/13/2020 0758   ALT 20 04/13/2020 0758   BILITOT 0.4 04/13/2020 0758     Reviewed most recent labs. No evidence of nutritional deficiency No MGUS LDH high but retic count normal.  RADIOGRAPHIC STUDIES: I have personally reviewed the radiological images as listed and agreed with the findings in the report. CT CHEST ABDOMEN PELVIS W CONTRAST  Addendum Date: 05/04/2020   ADDENDUM REPORT: 05/04/2020 09:36 ADDENDUM: These results were called by telephone at the time of interpretation on 05/04/2020 at 9:36 am to provider AmVicie MuttersA , who verbally acknowledged these results. Electronically Signed   By: GeZetta Bills.D.   On: 05/04/2020 09:36  Result Date: 05/04/2020 CLINICAL DATA:  Weight loss and epigastric pain in a 60 year old female, 35 pound weight loss since August. History of cystocele and rectocele EXAM: CT CHEST, ABDOMEN, AND PELVIS WITH CONTRAST TECHNIQUE: Multidetector CT imaging of the chest, abdomen and pelvis was performed following the standard protocol during bolus administration of intravenous contrast. CONTRAST:  129m ISOVUE-300 IOPAMIDOL (ISOVUE-300) INJECTION 61% COMPARISON:  None FINDINGS: CT CHEST FINDINGS Cardiovascular: Normal caliber thoracic aorta. Scattered atheromatous plaque. Normal heart size without pericardial effusion. RIGHT lower lobe pulmonary emboli best seen on image 95 and 96 of series 5 also seen on sagittal reconstructions, image 70 and 72 no sign of RIGHT heart strain with segmental and subsegmental emboli in the RIGHT lower lobe. Mediastinum/Nodes: No thoracic inlet, mediastinal or axillary lymphadenopathy. No hilar adenopathy. Lungs/Pleura: Multifocal masses and nodules in the chest. LEFT lower lobe pulmonary mass (image 77, series 4)  4.3 x 3.2 cm. RIGHT lower lobe pulmonary nodule (image 69, series 4) 2.4 cm. Greater than 15 nodules in the RIGHT chest. Relative sparing of the LEFT upper lobe with numerous nodules also in the LEFT lower lobe in addition to the mass outlined above. A second mass in the LEFT lower lobe measures 3.3 x 2.5 cm. Other nodule seen in the bilateral chest between 5 mm and 2 cm. Airways are patent. No effusion. Musculoskeletal: See below for full musculoskeletal details. CT ABDOMEN PELVIS FINDINGS Hepatobiliary: Signs of hepatic metastatic disease and or direct hepatic extension from large hepatorenal mass that appears to arise from the RIGHT kidney. Infiltrative hypervascular process extending from the RIGHT kidney and inseparable from the RIGHT posterior hepatic margin. Just above the hepatic hilum extending into or with mass effect upon and invasion of RIGHT hepatic lobe is the largest area of this mass measuring 12.2 x 11.5 cm. Anterior RIGHT hepatic lobe (image 44, series 2) 6.3 x 5.0 cm. Medial segment LEFT hepatic lobe (image 44, series 2) 1.7 cm. Other tiny lesions scattered throughout the LEFT hepatic lobe, at least 2 additional in the cephalad LEFT hemi liver, medial and lateral segment with a larger lesion in hepatic subsegment IV B measuring 2.7 cm on image 68. Nearly 4 cm lesion along the capsule in the RIGHT hepatic lobe (image 82, series 2) 3.9 cm. Portal vein remains patent. Thrombus, likely tumor thrombus in the RIGHT hepatic vein that appears to extend directly from the dominant a mass lesion (image 43, series 2) this extends cephalad into the lower RIGHT atrium. Additional thrombus is noted in the IVC on image 46. This likely extends from the mass proper in the kidney where there is gross renal venous expansion and extension. Pancreas: Normal, without mass, inflammation or ductal dilatation. Spleen: Normal spleen. Adrenals/Urinary Tract: LEFT adrenal is normal. LEFT kidney with small cyst in the  interpolar aspect. No hydronephrosis. Urinary bladder decompressed. Large mass arising from the RIGHT kidney and extending cephalad into the RIGHT liver, associated with renal veinous and hepatic venous thrombus, tumor thrombus expands the RIGHT renal vein. Mass measures 18 x 12 x 12 cm greatest axial dimension. Mass associated with necrotic adenopathy also in the retroperitoneum, high intra-aortocaval lymph node for example on image 63 of series 2 measures 15 mm short axis. Stomach/Bowel: No sign of bowel obstruction or acute bowel process. Vascular/Lymphatic: Adenopathy in the high intra-aortocaval groove as discussed. Normal caliber abdominal aorta with calcified atheromatous plaque and noncalcified plaque. Tumor thrombus extending into the IVC and hepatic veins as described. Smooth contour of the IVC below  venous involvement. No periaortic adenopathy. Nodular soft tissue seen in the RIGHT retroperitoneum anterior to the psoas on image 87 of series 2 measuring 4.4 x 1.5 cm, some of these areas likely reflect venous collateral pathways though tumor extension into these areas is not excluded. Reproductive: Post sacral colpopexy and hysterectomy. No adnexal masses on the LEFT. Ovarian vein tracks to either collateral pathways in the RIGHT retroperitoneum, more likely a mixture of tumor extension and collateral pathways involving RIGHT ovarian vein. No ascites. Other: No ascites or free air. Retroperitoneal nodularity and collateral pathways as discussed. Musculoskeletal: No acute musculoskeletal process. No destructive bone finding. IMPRESSION: 1. Large RIGHT renal mass with renal venous extension, direct hepatic invasion, hepatic and pulmonary metastatic disease likely reflecting renal cell carcinoma. 2. 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 3. Bland thrombus also likely present above as there are segmental emboli in the  RIGHT lower lobe pulmonary arterial branches best seen on image 95 of series 5. 4. Nodular soft tissue in the RIGHT retroperitoneum anterior to the psoas muscle may reflect a mixture of tumor extension and collateral pathways involving RIGHT ovarian vein. 5. Aortic atherosclerosis. A call is out to the referring provider to further discuss findings in the above case. Aortic Atherosclerosis (ICD10-I70.0). Electronically Signed: By: Zetta Bills M.D. On: 05/04/2020 09:25   CT BIOPSY  Result Date: 04/17/2020 INDICATION: 60 year old female with normocytic anemia. EXAM: CT-GUIDED BONE MARROW BIOPSY AND ASPIRATION MEDICATIONS: None ANESTHESIA/SEDATION: Fentanyl 100 mcg IV; Versed 2 mg IV Sedation Time: 10 minutes; The patient was continuously monitored during the procedure by the interventional radiology nurse under my direct supervision. COMPLICATIONS: None immediate. PROCEDURE: Informed consent was obtained from the patient following an explanation of the procedure, risks, benefits and alternatives. The patient understands, agrees and consents for the procedure. All questions were addressed. A time out was performed prior to the initiation of the procedure. The patient was positioned prone and non-contrast localization CT was performed of the pelvis to demonstrate the iliac marrow spaces. The operative site was prepped and draped in the usual sterile fashion. Under sterile conditions and local anesthesia, a 22 gauge spinal needle was utilized for procedural planning. Next, an 11 gauge coaxial bone biopsy needle was advanced into the right iliac marrow space. Needle position was confirmed with CT imaging. Initially, a bone marrow aspiration was performed. Next, a bone marrow biopsy was obtained with the 11 gauge outer bone marrow device. Samples were prepared with the cytotechnologist and deemed adequate. The needle was removed and superficial hemostasis was obtained with manual compression. A dressing was applied.  The patient tolerated the procedure well without immediate post procedural complication. IMPRESSION: Successful CT guided right iliac bone marrow aspiration and core biopsy. Ruthann Cancer, MD [facility] Radiology Electronically Signed   By: Ruthann Cancer MD   On: 04/16/2020 11:17   CT BONE MARROW BIOPSY & ASPIRATION  Result Date: 04/16/2020 INDICATION: 60 year old female with normocytic anemia. EXAM: CT-GUIDED BONE MARROW BIOPSY AND ASPIRATION MEDICATIONS: None ANESTHESIA/SEDATION: Fentanyl 100 mcg IV; Versed 2 mg IV Sedation Time: 10 minutes; The patient was continuously monitored during the procedure by the interventional radiology nurse under my direct supervision. COMPLICATIONS: None immediate. PROCEDURE: Informed consent was obtained from the patient following an explanation of the procedure, risks, benefits and alternatives. The patient understands, agrees and consents for the procedure. All questions were addressed. A time  out was performed prior to the initiation of the procedure. The patient was positioned prone and non-contrast localization CT was performed of the pelvis to demonstrate the iliac marrow spaces. The operative site was prepped and draped in the usual sterile fashion. Under sterile conditions and local anesthesia, a 22 gauge spinal needle was utilized for procedural planning. Next, an 11 gauge coaxial bone biopsy needle was advanced into the right iliac marrow space. Needle position was confirmed with CT imaging. Initially, a bone marrow aspiration was performed. Next, a bone marrow biopsy was obtained with the 11 gauge outer bone marrow device. Samples were prepared with the cytotechnologist and deemed adequate. The needle was removed and superficial hemostasis was obtained with manual compression. A dressing was applied. The patient tolerated the procedure well without immediate post procedural complication. IMPRESSION: Successful CT  guided right iliac bone marrow aspiration and core biopsy. Ruthann Cancer, MD [facility] Radiology Electronically Signed   By: Ruthann Cancer MD   On: 04/16/2020 11:17    04/16/2020    DIAGNOSIS:   BONE MARROW; FLOW CYTOMETRIC ANALYSIS:  - No monoclonal B-cell or immunophenotypically aberrant T-cell  population identified  - No increase in blasts  - See comment    COMMENT:   Correlation with comprehensive bone marrow evaluation is recommended for  complete interpretation and overall blast enumeration (see [id number]).    GATING AND PHENOTYPIC ANALYSIS:   Gated population: Flow cytometric immunophenotyping is performed using  antibodies to the antigens listed in the table below. Electronic gates  are placed around a cell cluster displaying light scatter properties  corresponding to: lymphocytes   Abnormal Cells in gated population: N/A   Phenotype of Abnormal Cells: N/A   Clinical History: normocytic anemia     Surgical Pathology  DIAGNOSIS:   BONE MARROW, ASPIRATE, CLOT, CORE:  - Normocellular bone marrow with trilineage hematopoiesis with  maturation  - See comment   PERIPHERAL BLOOD:  - Normocytic anemia  - Thrombocytosis  - See CBC data    COMMENT:   There is no significant dysplasia or increase in blasts. No ring  sideroblasts are identified on a Prussian blue stain of an aspirate  smear. There is no morphologic or flow cytometric evidence of an  atypical lymphoid population. Plasma cells are not increased by CD138  immunohistochemistry and show polytypic light chain expression by  kappa/lambda in situ hybridization. Correlation with clinical findings,  other laboratory data including CBC trends, and cytogenetic results is  recommended.    Benay Pike, MD 05/07/2020 10:26 AM  CT chest/abdomen/pelvis with contrast  IMPRESSION: 1. Large RIGHT renal mass with renal venous extension,  direct hepatic invasion, hepatic and pulmonary metastatic disease likely reflecting renal cell carcinoma. 2. 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 3. Bland thrombus also likely present above as there are segmental emboli in the RIGHT lower lobe pulmonary arterial branches best seen on image 95 of series 5. 4. Nodular soft tissue in the RIGHT retroperitoneum anterior to the psoas muscle may reflect a mixture of tumor extension and collateral pathways involving RIGHT ovarian vein. 5. Aortic atherosclerosis.  Time I spent 40 minutes in the care of this patient including history, review of medical records, physical exam, counseling, coordination of care and documentation Discussed with radiology, they will likely proceed with liver biopsy, orders placed.  Benay Pike MD

## 2020-05-08 ENCOUNTER — Encounter (HOSPITAL_COMMUNITY): Payer: Self-pay

## 2020-05-08 NOTE — Progress Notes (Unsigned)
    [name] Female, 60 y.o., [birth year]  MRN:  [id number] Phone:  [phone] [name])       PCP:  Josetta Huddle, MD Coverage:  Sherre Poot [place] [facility] ([facility]) 05/10/2020 at 8:00 PM           RE: Biopsy Received: Today  Message Details  Corrie Mckusick, DO  [facility] [place] for US guided liver mass biopsy.   Earleen Newport    Previous Messages  ----- Message -----  From: [name]  Sent: 05/08/2020 11:25 AM EDT  To: Ir Procedure Requests  Subject: Biopsy                      Procedure Requested: US Biopsy (liver)    Reason for Procedure: concern for metastatic kidney cancer    Provider Requesting: Benay Pike  Provider Telephone: [phone]

## 2020-05-08 NOTE — Progress Notes (Unsigned)
AB           [name]. [place] Female, 60 y.o., 04-20-1960  MRN:  [id number] Phone:  [phone] [name])       PCP:  Josetta Huddle, MD Coverage:  Sherre Poot [place] [facility] ([facility]) 05/10/2020 at 8:00 PM           RE: Biopsy Received: Today  Message Details  Benay Pike, MD  [name], RN Yes, we will bridge her.   [name], this was the patient who needs to bridge with lovenox  No lovenox the night before biopsy and the day of biopsy.   Thanks,    Previous Messages  ----- Message -----  From: [name]  Sent: 05/08/2020  2:34 PM EDT  To: Benay Pike, MD  Subject: FW: Biopsy                    Hello   Pt. [name] has been schedule for her biopsy  April 14, she is on Eliquis and needs to  Hold it two days prior to her biopsy.  Permission is needed.

## 2020-05-09 ENCOUNTER — Other Ambulatory Visit: Payer: Self-pay

## 2020-05-09 ENCOUNTER — Telehealth: Payer: Self-pay

## 2020-05-09 DIAGNOSIS — C641 Malignant neoplasm of right kidney, except renal pelvis: Secondary | ICD-10-CM

## 2020-05-09 MED ORDER — ENOXAPARIN SODIUM 30 MG/0.3ML ~~LOC~~ SOLN: 60.0000 mg | Freq: Two times a day (BID) | SUBCUTANEOUS | 0 refills | Status: DC

## 2020-05-09 MED ORDER — ENOXAPARIN SODIUM 30 MG/0.3ML ~~LOC~~ SOLN
60.0000 mg | Freq: Two times a day (BID) | SUBCUTANEOUS | 0 refills | Status: DC
Start: 2020-05-09 — End: 2020-05-09

## 2020-05-09 NOTE — Telephone Encounter (Signed)
-----   Message from Tami Lin, RN sent at 05/07/2020 10:52 AM EDT ----- [name]. I am not sure who will be with Dr. Chryl Heck the next few weeks but she wants to make sure this patient stays on the radar. A liver biopsy has been ordered and patient will need instructions on bridging Lovenox once the procedure is scheduled.  Per Dr. Rob Hickman office note:   Gave bridging instructions as well. She should take lovenox on D -2 and D-1 ( AM ) of the procedure, skip the evening dose of lovenox day before procedure and skip the morning dose on the day of procedure ----- Message ----- From: Benay Pike, MD Sent: 05/07/2020  10:28 AM EDT To: Tami Lin, RN  [name]  This is the patient we just spoke about regarding bridging lovenox.  Thanks,

## 2020-05-09 NOTE — Telephone Encounter (Signed)
I called the patient today to go over the Lovenox instructions/administration. I advised the Patient that she is to begin Lovenox on 4/12 with one dose in the AM and one dose in the PM, on 4/13 one dose in the AM and then skip the PM dose, and on 4/14 (the day of the procedure) skip AM dose of Lovenox and resume Eliquis that evening per Dr. Chryl Heck. The patient verbalized understanding. I advised her to contact us with any questions or concerns.

## 2020-05-09 NOTE — Progress Notes (Signed)
Pharmacy did not receive previous prescription for Lovenox. Resending prescription today.

## 2020-05-10 ENCOUNTER — Ambulatory Visit (HOSPITAL_COMMUNITY)
Admission: RE | Admit: 2020-05-10 | Discharge: 2020-05-10 | Disposition: A | Discharge status: Home / Self Care | Payer: BC Managed Care – PPO | Source: Ambulatory Visit | Attending: Hematology and Oncology | Admitting: Hematology and Oncology

## 2020-05-10 DIAGNOSIS — N2889 Other specified disorders of kidney and ureter: Secondary | ICD-10-CM | POA: Insufficient documentation

## 2020-05-10 DIAGNOSIS — C641 Malignant neoplasm of right kidney, except renal pelvis: Secondary | ICD-10-CM | POA: Diagnosis present

## 2020-05-10 MED ORDER — GADOBUTROL 1 MMOL/ML IV SOLN
6.0000 mL | Freq: Once | INTRAVENOUS | Status: AC | PRN
  Administered 2020-05-10: 6 mL via INTRAVENOUS

## 2020-05-11 ENCOUNTER — Telehealth: Payer: Self-pay

## 2020-05-11 ENCOUNTER — Other Ambulatory Visit: Payer: Self-pay | Admitting: Hematology and Oncology

## 2020-05-11 ENCOUNTER — Telehealth: Payer: Self-pay | Admitting: Hematology and Oncology

## 2020-05-11 DIAGNOSIS — C641 Malignant neoplasm of right kidney, except renal pelvis: Secondary | ICD-10-CM

## 2020-05-11 DIAGNOSIS — I639 Cerebral infarction, unspecified: Secondary | ICD-10-CM

## 2020-05-11 NOTE — Progress Notes (Signed)
I called Dr Erlinda Hong neurology on call to discuss MR brain results He kindly reviewed the imaging with me, and since she is already on anticoagulation and since she is clinically asymptomatic, he feels she can be investigated outpatient. I have sent an urgent referral to [place] and will also communicate with them.  Mattis Featherly

## 2020-05-11 NOTE — Telephone Encounter (Signed)
-----   Message from Benay Pike, MD sent at 05/11/2020 12:59 PM EDT ----- I called the patient and conveyed the MRI results. She doesn't have any neurological complaints. Given subacute infarcts, I recommended referral to neurology. She will continue eliquis in the interim She understands that she should go to the nearest ED with any new neurological complaints. I spoke to the husband and he doesn't feel like there is any thing significantly different with her. I will send a referral to neurology for any other recommendations.

## 2020-05-11 NOTE — Telephone Encounter (Signed)
Per Dr. Chryl Heck, I have faxed a STAT referral to [facility] at [phone] with receipt of confirmation. I contacted [facility] and requested that they contact the patient to be evaluated sometime within the next week.

## 2020-05-11 NOTE — Telephone Encounter (Signed)
R/s 4/15 appt to 4/25 per provider request in sch msg. Called and spoke with patient. Confirmed new date and time

## 2020-05-14 ENCOUNTER — Encounter: Payer: Self-pay | Admitting: Neurology

## 2020-05-14 ENCOUNTER — Ambulatory Visit: Payer: BC Managed Care – PPO | Admitting: Neurology

## 2020-05-14 ENCOUNTER — Telehealth: Payer: Self-pay

## 2020-05-14 VITALS — BP 122/68 | HR 92 | Ht 64.0 in | Wt 126.8 lb

## 2020-05-14 DIAGNOSIS — I63422 Cerebral infarction due to embolism of left anterior cerebral artery: Secondary | ICD-10-CM | POA: Diagnosis not present

## 2020-05-14 NOTE — Telephone Encounter (Signed)
Called to check-in with patient and ensure she was still clear on instructions for bridging from her eliquis to lovenox for her upcoming procedure. Patient verbalized an understanding of the instructions. Patient had no further questions or concerns at end of call

## 2020-05-14 NOTE — Progress Notes (Signed)
Reason for visit: Stroke  Referring physician: Dr. Clelia Croft is a 60 y.o. female  History of present illness:  Ms. Jabbour is a 60 year old right-handed white female who was recently discovered to have a right renal mass.  Evaluation of the mass revealed that she also had thrombus in the renal vein, hepatic vein, right atrium, and in the pulmonary circulation.  The patient has since been placed on Eliquis.  MRI of the brain was done as part of the staging process, the patient was found to have evidence of subacute infarctions involving the left PCA distribution and right middle cerebral/anterior cerebral distribution with some enhancement in the left thalamic area.  The patient has had no clinical symptoms.  At around 11 AM today, she did have a transient episode of blurred vision and possible oscillopsia lasting about 2 minutes with full resolution.  The patient has not noted any numbness or weakness of the face, arms, or legs.  She denies any significant changes in balance or difficulty controlling the bowels or the bladder.  She has had a chronic issue with anemia.  She does not smoke cigarettes, she does not drink alcohol, she denies use of illicit drugs such as cocaine or marijuana.  Given the MRI findings of the brain, she is sent to this office for an evaluation.  Past Medical History:  Diagnosis Date  . Hypertension   . Stroke ([place])   . Vaginal prolapse     Past Surgical History:  Procedure Laterality Date  . ABDOMINAL HYSTERECTOMY    . CYSTOCELE REPAIR    . RECTOCELE REPAIR    . TUBAL LIGATION      Family History  Problem Relation Age of Onset  . COPD Mother   . Hypertension Mother   . Leukemia Father        chronic mylemonocytic leukemia  . Colon cancer Paternal Grandfather     Social history:  reports that she quit smoking about 38 years ago. She has never used smokeless tobacco. She reports current alcohol use. She reports that she does not use  drugs.  Medications:  Prior to Admission medications   Medication Sig Start Date End Date Taking? Authorizing Provider  APIXABAN (ELIQUIS) VTE STARTER PACK (10MG  AND 5MG ) Take as directed on package: start with two-5mg  tablets twice daily for 7 days. On day 8, switch to one-5mg  tablet twice daily. 05/04/20   Benay Pike, MD  enoxaparin (LOVENOX) 30 MG/0.3ML injection Inject 0.6 mLs (60 mg total) into the skin every 12 (twelve) hours for 2 days. Start using lovenox instead of Eliquis 2 days before the procedure. Skip lovenox dose the evening before the procedure and the day of planned procedure. 05/09/20 05/11/20  Benay Pike, MD  fenofibrate (TRICOR) 145 MG tablet Take 1 tablet by mouth daily. 10/19/13   [provider]  losartan-hydrochlorothiazide (HYZAAR) 100-25 MG tablet Take 1 tablet by mouth daily.    [provider]  Multiple Vitamin (ONE DAILY) tablet Take 1 tablet by mouth daily.    [provider]      Allergies  Allergen Reactions  . Amlodipine     Other reaction(s): dizziness  . Crestor [Rosuvastatin] Other (See Comments)    Muscle cramps    ROS:  Out of a complete 14 system review of symptoms, the patient complains only of the following symptoms, and all other reviewed systems are negative.  Anemia Weight loss  Blood pressure 122/68, pulse 92, height 5\' 4"  (  1.626 m), weight 126 lb 12.8 oz (57.5 kg).  Physical Exam  General: The patient is alert and cooperative at the time of the examination.  Eyes: Pupils are equal, round, and reactive to light. Discs are flat bilaterally.  Neck: The neck is supple, no carotid bruits are noted.  Respiratory: The respiratory examination is clear.  Cardiovascular: The cardiovascular examination reveals a regular rate and rhythm, no obvious murmurs or rubs are noted.  Skin: Extremities are without significant edema.  Neurologic Exam  Mental status: The patient is alert and oriented x 3 at the time of  the examination. The patient has apparent normal recent and remote memory, with an apparently normal attention span and concentration ability.  Cranial nerves: Facial symmetry is present. There is good sensation of the face to pinprick and soft touch bilaterally. The strength of the facial muscles and the muscles to head turning and shoulder shrug are normal bilaterally. Speech is well enunciated, no aphasia or dysarthria is noted. Extraocular movements are full. Visual fields are full. The tongue is midline, and the patient has symmetric elevation of the soft palate. No obvious hearing deficits are noted.  Motor: The motor testing reveals 5 over 5 strength of all 4 extremities. Good symmetric motor tone is noted throughout.  Sensory: Sensory testing is intact to pinprick, soft touch, vibration sensation, and position sense on all 4 extremities. No evidence of extinction is noted.  Coordination: Cerebellar testing reveals good finger-nose-finger and heel-to-shin bilaterally.  Gait and station: Gait is normal. Tandem gait is slightly unsteady. Romberg is negative. No drift is seen.  Reflexes: Deep tendon reflexes are symmetric and normal bilaterally, with exception of some decrease in the right ankle jerk reflex. Toes are downgoing bilaterally.   MRI brain 05/11/20:  IMPRESSION: 1. Findings of subacute infarction in the left PCA and right ACA/MCA territories. 2. Areas of left thalamic and bilateral cortical enhancement are likely related to subacute infarction. 3. Indeterminate nodular focus of enhancement arising from cortex or dura along the high left frontal convexity at measuring 5 mm. 4. Recommend brain MRI with contrast in 6-12 weeks to allow for resolution of infarct related enhancement.  * MRI scan images were reviewed online. I agree with the written report.   Assessment/Plan:  1.  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   Hypercoagulable state with thrombus in the renal vein, hepatic vein, right atrium, and pulmonary circulation  3.  Subacute infarcts, brain  The infarct seen on the brain MRI may be related to the hypercoagulable state.  The patient has had venous thrombosis, and if combined with a small patent foramen ovale, this may lead to small embolic infarcts seen on the MRI study.  The patient will be set up for 2D echocardiogram and she will have MRA of the head and neck.  She will remain on anticoagulation.  We will recheck the MRI of the brain with contrast in about 2 months.  Jill Alexanders MD 05/14/2020 2:40 PM  [facility] [address] [place],  [address]  Phone [phone] Fax [phone]

## 2020-05-15 ENCOUNTER — Telehealth: Payer: Self-pay | Admitting: Neurology

## 2020-05-15 NOTE — Telephone Encounter (Signed)
Patient returned my call she is scheduled at [facility] for 05/22/20.

## 2020-05-15 NOTE — Telephone Encounter (Signed)
LVM for pt to call back about scheduling mri  [employer] auth: [id number] (exp. 05/14/20 to 11/09/20)

## 2020-05-16 ENCOUNTER — Other Ambulatory Visit: Payer: Self-pay | Admitting: Radiology

## 2020-05-17 ENCOUNTER — Other Ambulatory Visit: Payer: Self-pay

## 2020-05-17 ENCOUNTER — Ambulatory Visit (HOSPITAL_COMMUNITY)
Admission: RE | Admit: 2020-05-17 | Discharge: 2020-05-17 | Disposition: A | Discharge status: Home / Self Care | Payer: BC Managed Care – PPO | Source: Ambulatory Visit | Attending: Hematology and Oncology | Admitting: Hematology and Oncology

## 2020-05-17 ENCOUNTER — Encounter (HOSPITAL_COMMUNITY): Payer: Self-pay

## 2020-05-17 DIAGNOSIS — C641 Malignant neoplasm of right kidney, except renal pelvis: Secondary | ICD-10-CM | POA: Diagnosis not present

## 2020-05-17 DIAGNOSIS — I10 Essential (primary) hypertension: Secondary | ICD-10-CM | POA: Insufficient documentation

## 2020-05-17 DIAGNOSIS — N2889 Other specified disorders of kidney and ureter: Secondary | ICD-10-CM

## 2020-05-17 DIAGNOSIS — C787 Secondary malignant neoplasm of liver and intrahepatic bile duct: Secondary | ICD-10-CM | POA: Insufficient documentation

## 2020-05-17 DIAGNOSIS — Z79899 Other long term (current) drug therapy: Secondary | ICD-10-CM | POA: Diagnosis not present

## 2020-05-17 DIAGNOSIS — C649 Malignant neoplasm of unspecified kidney, except renal pelvis: Secondary | ICD-10-CM | POA: Diagnosis present

## 2020-05-17 LAB — CBC WITH DIFFERENTIAL/PLATELET
Abs Immature Granulocytes: 0.06 10*3/uL (ref 0.00–0.07)
Basophils Absolute: 0.1 10*3/uL (ref 0.0–0.1)
Basophils Relative: 1 %
Eosinophils Absolute: 0.1 10*3/uL (ref 0.0–0.5)
Eosinophils Relative: 2 %
HCT: 30.4 % — ABNORMAL LOW (ref 36.0–46.0)
Hemoglobin: 9 g/dL — ABNORMAL LOW (ref 12.0–15.0)
Immature Granulocytes: 1 %
Lymphocytes Relative: 24 %
Lymphs Abs: 2 10*3/uL (ref 0.7–4.0)
MCH: 24.1 pg — ABNORMAL LOW (ref 26.0–34.0)
MCHC: 29.6 g/dL — ABNORMAL LOW (ref 30.0–36.0)
MCV: 81.3 fL (ref 80.0–100.0)
Monocytes Absolute: 0.8 10*3/uL (ref 0.1–1.0)
Monocytes Relative: 9 %
Neutro Abs: 5.5 10*3/uL (ref 1.7–7.7)
Neutrophils Relative %: 63 %
Platelets: 457 10*3/uL — ABNORMAL HIGH (ref 150–400)
RBC: 3.74 MIL/uL — ABNORMAL LOW (ref 3.87–5.11)
RDW: 15 % (ref 11.5–15.5)
WBC: 8.6 10*3/uL (ref 4.0–10.5)
nRBC: 0 % (ref 0.0–0.2)

## 2020-05-17 LAB — PROTIME-INR
INR: 1.1 (ref 0.8–1.2)
Prothrombin Time: 13.9 seconds (ref 11.4–15.2)

## 2020-05-17 LAB — COMPREHENSIVE METABOLIC PANEL
ALT: 37 U/L (ref 0–44)
AST: 90 U/L — ABNORMAL HIGH (ref 15–41)
Albumin: 3.5 g/dL (ref 3.5–5.0)
Alkaline Phosphatase: 84 U/L (ref 38–126)
Anion gap: 10 (ref 5–15)
BUN: 25 mg/dL — ABNORMAL HIGH (ref 8–23)
CO2: 25 mmol/L (ref 22–32)
Calcium: 11.3 mg/dL — ABNORMAL HIGH (ref 8.9–10.3)
Chloride: 97 mmol/L — ABNORMAL LOW (ref 98–111)
Creatinine, Ser: 1.14 mg/dL — ABNORMAL HIGH (ref 0.44–1.00)
GFR, Estimated: 54 mL/min — ABNORMAL LOW (ref >60)
Glucose, Bld: 124 mg/dL — ABNORMAL HIGH (ref 70–99)
Potassium: 5.1 mmol/L (ref 3.5–5.1)
Sodium: 132 mmol/L — ABNORMAL LOW (ref 135–145)
Total Bilirubin: 0.9 mg/dL (ref 0.3–1.2)
Total Protein: 7.9 g/dL (ref 6.5–8.1)

## 2020-05-17 MED ORDER — MIDAZOLAM HCL 2 MG/2ML IJ SOLN
INTRAMUSCULAR | Status: AC
  Filled 2020-05-17: qty 2

## 2020-05-17 MED ORDER — SODIUM CHLORIDE 0.9 % IV SOLN: INTRAVENOUS | Status: DC

## 2020-05-17 MED ORDER — LIDOCAINE HCL (PF) 1 % IJ SOLN
INTRAMUSCULAR | Status: AC | PRN
  Administered 2020-05-17: 10 mL

## 2020-05-17 MED ORDER — MIDAZOLAM HCL 2 MG/2ML IJ SOLN
INTRAMUSCULAR | Status: AC | PRN
  Administered 2020-05-17 (×2): 1 mg via INTRAVENOUS

## 2020-05-17 MED ORDER — LIDOCAINE HCL 1 % IJ SOLN
INTRAMUSCULAR | Status: AC
  Filled 2020-05-17: qty 20

## 2020-05-17 MED ORDER — FENTANYL CITRATE (PF) 100 MCG/2ML IJ SOLN
INTRAMUSCULAR | Status: AC
  Filled 2020-05-17: qty 2

## 2020-05-17 MED ORDER — GELATIN ABSORBABLE 12-7 MM EX MISC
CUTANEOUS | Status: AC
  Filled 2020-05-17: qty 1

## 2020-05-17 MED ORDER — FENTANYL CITRATE (PF) 100 MCG/2ML IJ SOLN
INTRAMUSCULAR | Status: AC | PRN
  Administered 2020-05-17 (×2): 50 ug via INTRAVENOUS

## 2020-05-17 MED ORDER — HYDROCODONE-ACETAMINOPHEN 5-325 MG PO TABS: 1.0000 | ORAL_TABLET | ORAL | Status: DC | PRN

## 2020-05-17 NOTE — H&P (Addendum)
Chief Complaint: Patient was seen in consultation today for liver mass/biopsy.  Referring Physician(s): Benay Pike (oncology)  Supervising Physician: Markus Daft  Patient Status: [facility] - Out-pt  History of Present Illness: [name] is a 60 y.o. female with a past medical history of hypertension, CVA, and anemia. She is know to IR-she underwent bone marrow biopsy/aspiration with [place] 04/16/2020 secondary to anemia (results negative without any evidence of dysplasia or increased blasts, normal cellular bone marrow with trilineage hematopoiesis with maturation, flow negative). As part of work-up, she also underwent a CT chest/abdomen/pelvis which revealed multiple masses concerning for metastatic RCC.  CT chest/abdomen/pelvis 05/04/2020: 1. Large RIGHT renal mass with renal venous extension, direct hepatic invasion, hepatic and pulmonary metastatic disease likely reflecting renal cell carcinoma. 2. 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 3. Bland thrombus also likely present above as there are segmental emboli in the RIGHT lower lobe pulmonary arterial branches best seen on image 95 of series 5. 4. Nodular soft tissue in the RIGHT retroperitoneum anterior to the psoas muscle may reflect a mixture of tumor extension and collateral pathways involving RIGHT ovarian vein. 5. Aortic atherosclerosis.  IR consulted by Dr. Chryl Heck for possible image-guided liver mass biopsy for tissue diagnosis. Patient awake and alert sitting in bed with no complaints at this time. Denies fever, chills, chest pain, dyspnea, abdominal pain, or headache.  LD Eliquis Monday 05/14/2020. LD SQ Lovenox 05/16/2020 AM.   Past Medical History:  Diagnosis Date  . Hypertension   . Stroke ([place])   . Vaginal prolapse     Past Surgical History:  Procedure Laterality Date  . ABDOMINAL HYSTERECTOMY    . CYSTOCELE REPAIR    .  RECTOCELE REPAIR    . TUBAL LIGATION      Allergies: Amlodipine and Crestor [rosuvastatin]  Medications: Prior to Admission medications   Medication Sig Start Date End Date Taking? Authorizing Provider  fenofibrate (TRICOR) 145 MG tablet Take 1 tablet by mouth daily. 10/19/13  Yes [provider]  losartan-hydrochlorothiazide (HYZAAR) 100-25 MG tablet Take 1 tablet by mouth daily.   Yes [provider]  Multiple Vitamin (ONE DAILY) tablet Take 1 tablet by mouth daily.   Yes [provider]  APIXABAN (ELIQUIS) VTE STARTER PACK (10MG AND 5MG) Take as directed on package: start with two-45m tablets twice daily for 7 days. On day 8, switch to one-562mtablet twice daily. 05/04/20   IrBenay PikeMD  enoxaparin (LOVENOX) 30 MG/0.3ML injection Inject 0.6 mLs (60 mg total) into the skin every 12 (twelve) hours for 2 days. Start using lovenox instead of Eliquis 2 days before the procedure. Skip lovenox dose the evening before the procedure and the day of planned procedure. 05/09/20 05/11/20  IrBenay PikeMD     Family History  Problem Relation Age of Onset  . COPD Mother   . Hypertension Mother   . Leukemia Father        chronic mylemonocytic leukemia  . Colon cancer Paternal Grandfather     Social History   Socioeconomic History  . Marital status: Married    Spouse name: MiRonalee Belts. Number of children: Not on file  . Years of education: Not on file  . Highest education level: Not on file  Occupational History  . Occupation: Part time  Tobacco Use  . Smoking status: Former Smoker    Quit date: 11/12/1981  Years since quitting: 38.5  . Smokeless tobacco: Never Used  Vaping Use  . Vaping Use: Never used  Substance and Sexual Activity  . Alcohol use: Yes  . Drug use: No  . Sexual activity: Not on file  Other Topics Concern  . Not on file  Social History Narrative   Lives with husband   Right Handed   Drinks 6-8 cups caffeine daily   Social  Determinants of Health   Financial Resource Strain: Not on file  Food Insecurity: Not on file  Transportation Needs: Not on file  Physical Activity: Not on file  Stress: Not on file  Social Connections: Not on file     Review of Systems: A 12 point ROS discussed and pertinent positives are indicated in the HPI above.  All other systems are negative.  Review of Systems  Constitutional: Negative for chills and fever.  Respiratory: Negative for shortness of breath and wheezing.   Cardiovascular: Negative for chest pain and palpitations.  Gastrointestinal: Negative for abdominal pain.  Neurological: Negative for headaches.  Psychiatric/Behavioral: Negative for behavioral problems and confusion.    Vital Signs: BP 130/62 (BP Location: Right Arm)   Pulse (!) 104   Temp 97.6 F (36.4 C) (Oral)   Resp 16   SpO2 100%   Physical Exam Vitals and nursing note reviewed.  Constitutional:      General: She is not in acute distress.    Appearance: Normal appearance.  Cardiovascular:     Rate and Rhythm: Normal rate and regular rhythm.     Heart sounds: Normal heart sounds. No murmur heard.   Pulmonary:     Effort: Pulmonary effort is normal. No respiratory distress.     Breath sounds: Normal breath sounds. No wheezing.  Skin:    General: Skin is warm and dry.  Neurological:     Mental Status: She is alert and oriented to person, place, and time.      MD Evaluation Airway: WNL Heart: WNL Abdomen: WNL Chest/ Lungs: WNL ASA  Classification: 3 Mallampati/Airway Score: Two   Imaging: MR Brain W Wo Contrast  Result Date: 05/11/2020 CLINICAL DATA:  Renal cancer staging. EXAM: MRI HEAD WITHOUT AND WITH CONTRAST TECHNIQUE: Multiplanar, multiecho pulse sequences of the brain and surrounding structures were obtained without and with intravenous contrast. CONTRAST:  12m GADAVIST GADOBUTROL 1 MMOL/ML IV SOLN COMPARISON:  07/01/2004 FINDINGS: Brain: Patchy weakly restricted diffusion  in the left thalamus and along the left frontal parietal cortex. Rounded area of similar signal is seen in the right centrum semiovale and subcortical right frontal white matter. Patchy enhancement is seen in the medial left thalamus. Subcentimeter nodular focus of enhancement at left occipital cortex on 18:54. Nodular focus of enhancement at the high left frontal convexity measuring up to 5 mm, which could be dural or cortical, new from prior. Curvilinear enhancement along the high and anterior right a parietal cortex marked on series 18. 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 Even smaller arachnoid cyst in the high anterior left frontal region. Vascular: Normal flow voids and vascular enhancements. Skull and upper cervical spine: No noted metastatic disease. Sinuses/Orbits: Negative Other: These results will be called to the ordering clinician or representative by the Radiologist Assistant, and communication documented in the PACS or [employer] IMPRESSION: 1. Findings of subacute infarction in the left PCA and right ACA/MCA territories. 2. Areas of left thalamic and bilateral  cortical enhancement are likely related to subacute infarction. 3. Indeterminate nodular focus of enhancement arising from cortex or dura along the high left frontal convexity at measuring 5 mm. 4. Recommend brain MRI with contrast in 6-12 weeks to allow for resolution of infarct related enhancement. Electronically Signed   By: Monte Fantasia M.D.   On: 05/11/2020 09:10   CT CHEST ABDOMEN PELVIS W CONTRAST  Addendum Date: 05/04/2020   ADDENDUM REPORT: 05/04/2020 09:36 ADDENDUM: These results were called by telephone at the time of interpretation on 05/04/2020 at 9:36 am to provider Vicie Mutters PA , who verbally acknowledged these results. Electronically Signed   By: Zetta Bills M.D.   On: 05/04/2020 09:36   Result Date: 05/04/2020 CLINICAL DATA:  Weight  loss and epigastric pain in a 60 year old female, 35 pound weight loss since August. History of cystocele and rectocele EXAM: CT CHEST, ABDOMEN, AND PELVIS WITH CONTRAST TECHNIQUE: Multidetector CT imaging of the chest, abdomen and pelvis was performed following the standard protocol during bolus administration of intravenous contrast. CONTRAST:  149m ISOVUE-300 IOPAMIDOL (ISOVUE-300) INJECTION 61% COMPARISON:  None FINDINGS: CT CHEST FINDINGS Cardiovascular: Normal caliber thoracic aorta. Scattered atheromatous plaque. Normal heart size without pericardial effusion. RIGHT lower lobe pulmonary emboli best seen on image 95 and 96 of series 5 also seen on sagittal reconstructions, image 70 and 72 no sign of RIGHT heart strain with segmental and subsegmental emboli in the RIGHT lower lobe. Mediastinum/Nodes: No thoracic inlet, mediastinal or axillary lymphadenopathy. No hilar adenopathy. Lungs/Pleura: Multifocal masses and nodules in the chest. LEFT lower lobe pulmonary mass (image 77, series 4) 4.3 x 3.2 cm. RIGHT lower lobe pulmonary nodule (image 69, series 4) 2.4 cm. Greater than 15 nodules in the RIGHT chest. Relative sparing of the LEFT upper lobe with numerous nodules also in the LEFT lower lobe in addition to the mass outlined above. A second mass in the LEFT lower lobe measures 3.3 x 2.5 cm. Other nodule seen in the bilateral chest between 5 mm and 2 cm. Airways are patent. No effusion. Musculoskeletal: See below for full musculoskeletal details. CT ABDOMEN PELVIS FINDINGS Hepatobiliary: Signs of hepatic metastatic disease and or direct hepatic extension from large hepatorenal mass that appears to arise from the RIGHT kidney. Infiltrative hypervascular process extending from the RIGHT kidney and inseparable from the RIGHT posterior hepatic margin. Just above the hepatic hilum extending into or with mass effect upon and invasion of RIGHT hepatic lobe is the largest area of this mass measuring 12.2 x 11.5 cm.  Anterior RIGHT hepatic lobe (image 44, series 2) 6.3 x 5.0 cm. Medial segment LEFT hepatic lobe (image 44, series 2) 1.7 cm. Other tiny lesions scattered throughout the LEFT hepatic lobe, at least 2 additional in the cephalad LEFT hemi liver, medial and lateral segment with a larger lesion in hepatic subsegment IV B measuring 2.7 cm on image 68. Nearly 4 cm lesion along the capsule in the RIGHT hepatic lobe (image 82, series 2) 3.9 cm. Portal vein remains patent. Thrombus, likely tumor thrombus in the RIGHT hepatic vein that appears to extend directly from the dominant a mass lesion (image 43, series 2) this extends cephalad into the lower RIGHT atrium. Additional thrombus is noted in the IVC on image 46. This likely extends from the mass proper in the kidney where there is gross renal venous expansion and extension. Pancreas: Normal, without mass, inflammation or ductal dilatation. Spleen: Normal spleen. Adrenals/Urinary Tract: LEFT adrenal is normal. LEFT kidney with small cyst  in the interpolar aspect. No hydronephrosis. Urinary bladder decompressed. Large mass arising from the RIGHT kidney and extending cephalad into the RIGHT liver, associated with renal veinous and hepatic venous thrombus, tumor thrombus expands the RIGHT renal vein. Mass measures 18 x 12 x 12 cm greatest axial dimension. Mass associated with necrotic adenopathy also in the retroperitoneum, high intra-aortocaval lymph node for example on image 63 of series 2 measures 15 mm short axis. Stomach/Bowel: No sign of bowel obstruction or acute bowel process. Vascular/Lymphatic: Adenopathy in the high intra-aortocaval groove as discussed. Normal caliber abdominal aorta with calcified atheromatous plaque and noncalcified plaque. Tumor thrombus extending into the IVC and hepatic veins as described. Smooth contour of the IVC below venous involvement. No periaortic adenopathy. Nodular soft tissue seen in the RIGHT retroperitoneum anterior to the psoas on  image 87 of series 2 measuring 4.4 x 1.5 cm, some of these areas likely reflect venous collateral pathways though tumor extension into these areas is not excluded. Reproductive: Post sacral colpopexy and hysterectomy. No adnexal masses on the LEFT. Ovarian vein tracks to either collateral pathways in the RIGHT retroperitoneum, more likely a mixture of tumor extension and collateral pathways involving RIGHT ovarian vein. No ascites. Other: No ascites or free air. Retroperitoneal nodularity and collateral pathways as discussed. Musculoskeletal: No acute musculoskeletal process. No destructive bone finding. IMPRESSION: 1. Large RIGHT renal mass with renal venous extension, direct hepatic invasion, hepatic and pulmonary metastatic disease likely reflecting renal cell carcinoma. 2. 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 3. Bland thrombus also likely present above as there are segmental emboli in the RIGHT lower lobe pulmonary arterial branches best seen on image 95 of series 5. 4. Nodular soft tissue in the RIGHT retroperitoneum anterior to the psoas muscle may reflect a mixture of tumor extension and collateral pathways involving RIGHT ovarian vein. 5. Aortic atherosclerosis. A call is out to the referring provider to further discuss findings in the above case. Aortic Atherosclerosis (ICD10-I70.0). Electronically Signed: By: Zetta Bills M.D. On: 05/04/2020 09:25    Labs:  CBC: Recent Labs    04/13/20 0758 04/16/20 0930 05/07/20 0949 05/17/20 1145  WBC 8.2 9.2 8.1 8.6  HGB 9.2* 9.5* 8.8* 9.0*  HCT 30.4* 32.0* 29.4* 30.4*  PLT 500* 546* 492* 457*    COAGS: Recent Labs    04/16/20 0930  INR 1.1    BMP: Recent Labs    03/22/20 1059 04/04/20 1037 04/13/20 0758 05/07/20 0949  NA 133* 134* 135 135  K 4.3 4.5 4.7 4.4  CL 99 100 101 99  CO2 25 26 26 24   GLUCOSE 117* 148* 111* 124*  BUN 17 16 15 16    CALCIUM 11.5* 11.1* 11.2* 10.8*  CREATININE 1.11* 0.96 1.00 0.94  GFRNONAA 57* >60 >60 >60    LIVER FUNCTION TESTS: Recent Labs    03/22/20 1059 04/04/20 1037 04/13/20 0758 05/07/20 0949  BILITOT 0.4 0.4 0.4 0.4  AST 50* 50* 48* 45*  ALT 19 22 20 21   ALKPHOS 81 74 85 88  PROT 7.9 7.4 7.8 7.3  ALBUMIN 3.3* 3.1* 3.3* 3.1*     Assessment and Plan:  Multiple masses (including liver masses) concerning for possible metastatic RCC. Plan for image-guided liver mass biopsy today in IR. Patient is NPO. Afebrile and WBCs WNL. Eliquis/Lovenox held per IR protocol. INR pending.  Risks and benefits discussed with the patient including, but not  limited to bleeding, infection, damage to adjacent structures or low yield requiring additional tests. All of the patient's questions were answered, patient is agreeable to proceed. Consent signed and in chart.   Thank you for this interesting consult.  I greatly enjoyed meeting [name] and look forward to participating in their care.  A copy of this report was sent to the requesting provider on this date.  Electronically Signed: Earley Abide, PA-C 05/17/2020, 12:11 PM   I spent a total of 25 Minutes in face to face in clinical consultation, greater than 50% of which was counseling/coordinating care for liver mass/biopsy.

## 2020-05-17 NOTE — Procedures (Signed)
Interventional Radiology Procedure:   Indications: Right renal mass and metastatic disease  Procedure: US guided right hepatic lesion biopsy  Findings: 4 cores from right hepatic lesion.  Gelfoam slurry injected, no immediate hematoma formation.   Complications: None     EBL: less than 10 ml  Plan: Bedrest 3 hours, restart anticoagulation tomorrow.    Kjirsten Bloodgood R. Anselm Pancoast, MD  Pager: [phone]

## 2020-05-17 NOTE — Discharge Instructions (Signed)
Moderate Conscious Sedation, Adult, Care After This sheet gives you information about how to care for yourself after your procedure. Your health care provider may also give you more specific instructions. If you have problems or questions, contact your health care provider. What can I expect after the procedure? After the procedure, it is common to have:  Sleepiness for several hours.  Impaired judgment for several hours.  Difficulty with balance.  Vomiting if you eat too soon. Follow these instructions at home: For the time period you were told by your health care provider:  Rest.  Do not participate in activities where you could fall or become injured.  Do not drive or use machinery.  Do not drink alcohol.  Do not take sleeping pills or medicines that cause drowsiness.  Do not make important decisions or sign legal documents.  Do not take care of children on your own.      Eating and drinking  Follow the diet recommended by your health care provider.  Drink enough fluid to keep your urine pale yellow.  If you vomit: ? Drink water, juice, or soup when you can drink without vomiting. ? Make sure you have little or no nausea before eating solid foods.   General instructions  Take over-the-counter and prescription medicines only as told by your health care provider.  Have a responsible adult stay with you for the time you are told. It is important to have someone help care for you until you are awake and alert.  Do not smoke.  Keep all follow-up visits as told by your health care provider. This is important. Contact a health care provider if:  You are still sleepy or having trouble with balance after 24 hours.  You feel light-headed.  You keep feeling nauseous or you keep vomiting.  You develop a rash.  You have a fever.  You have redness or swelling around the IV site. Get help right away if:  You have trouble breathing.  You have new-onset confusion at  home. Summary  After the procedure, it is common to feel sleepy, have impaired judgment, or feel nauseous if you eat too soon.  Rest after you get home. Know the things you should not do after the procedure.  Follow the diet recommended by your health care provider and drink enough fluid to keep your urine pale yellow.  Get help right away if you have trouble breathing or new-onset confusion at home. This information is not intended to replace advice given to you by your health care provider. Make sure you discuss any questions you have with your health care provider. Document Revised: 05/20/2019 Document Reviewed: 12/16/2018 Elsevier Patient Education  2021 [employer]. Liver Biopsy, Care After These instructions give you information on caring for yourself after your procedure. Your doctor may also give you more specific instructions. Call your doctor if you have any problems or questions after your procedure. What can I expect after the procedure? After the procedure, it is common to have:  Pain and soreness where the biopsy was done.  Bruising around the area where the biopsy was done.  Sleepiness and be tired for a few days. Follow these instructions at home: Medicines  Take over-the-counter and prescription medicines only as told by your doctor.  If you were prescribed an antibiotic medicine, take it as told by your doctor. Do not stop taking the antibiotic even if you start to feel better.  Do not take medicines such as aspirin and ibuprofen.  These medicines can thin your blood. Do not take these medicines unless your doctor tells you to take them.  If you are taking prescription pain medicine, take actions to prevent or treat constipation. Your doctor may recommend that you: ? Drink enough fluid to keep your pee (urine) clear or pale yellow. ? Take over-the-counter or prescription medicines. ? Eat foods that are high in fiber, such as fresh fruits and vegetables, whole  grains, and beans. ? Limit foods that are high in fat and processed sugars, such as fried and sweet foods. Caring for your cut  Follow instructions from your doctor about how to take care of your cuts from surgery (incisions). Make sure you: ? Wash your hands with soap and water before you change your bandage (dressing). If you cannot use soap and water, use hand sanitizer. ? Change your bandage as told by your doctor. ? Leave stitches (sutures), skin glue, or skin tape (adhesive) strips in place. They may need to stay in place for 2 weeks or longer. If tape strips get loose and curl up, you may trim the loose edges. Do not remove tape strips completely unless your doctor says it is okay.  Check your cuts every day for signs of infection. Check for: ? Redness, swelling, or more pain. ? Fluid or blood. ? Pus or a bad smell. ? Warmth.  Do not take baths, swim, or use a hot tub until your doctor says it is okay to do so. Activity  Rest at home for 1-2 days or as told by your doctor. ? Avoid sitting for a long time without moving. Get up to take short walks every 1-2 hours.  Return to your normal activities as told by your doctor. Ask what activities are safe for you.  Do not do these things in the first 24 hours: ? Drive. ? Use machinery. ? Take a bath or shower.  Do not lift more than 10 pounds (4.5 kg) or play contact sports for the first 2 weeks.   General instructions  Do not drink alcohol in the first week after the procedure.  Have someone stay with you for at least 24 hours after the procedure.  Get your test results. Ask your doctor or the department that is doing the test: ? When will my results be ready? ? How will I get my results? ? What are my treatment options? ? What other tests do I need? ? What are my next steps?  Keep all follow-up visits as told by your doctor. This is important.   Contact a doctor if:  A cut bleeds and leaves more than just a small spot  of blood.  A cut is red, puffs up (swells), or hurts more than before.  Fluid or something else comes from a cut.  A cut smells bad.  You have a fever or chills. Get help right away if:  You have swelling, bloating, or pain in your belly (abdomen).  You get dizzy or faint.  You have a rash.  You feel sick to your stomach (nauseous) or throw up (vomit).  You have trouble breathing, feel short of breath, or feel faint.  Your chest hurts.  You have problems talking or seeing.  You have trouble with your balance or moving your arms or legs. Summary  After the procedure, it is common to have pain, soreness, bruising, and tiredness.  Your doctor will tell you how to take care of yourself at home. Change your bandage,  take your medicines, and limit your activities as told by your doctor.  Call your doctor if you have symptoms of infection. Get help right away if your belly swells, your cut bleeds a lot, or you have trouble talking or breathing. This information is not intended to replace advice given to you by your health care provider. Make sure you discuss any questions you have with your health care provider. Document Revised: 01/29/2017 Document Reviewed: 01/30/2017 Elsevier Patient Education  2021 [employer].

## 2020-05-18 ENCOUNTER — Inpatient Hospital Stay: Payer: BC Managed Care – PPO | Admitting: Hematology and Oncology

## 2020-05-21 LAB — SURGICAL PATHOLOGY

## 2020-05-22 ENCOUNTER — Ambulatory Visit: Payer: BC Managed Care – PPO

## 2020-05-22 ENCOUNTER — Other Ambulatory Visit: Payer: Self-pay

## 2020-05-22 DIAGNOSIS — I63422 Cerebral infarction due to embolism of left anterior cerebral artery: Secondary | ICD-10-CM

## 2020-05-22 MED ORDER — GADOBENATE DIMEGLUMINE 529 MG/ML IV SOLN
20.0000 mL | Freq: Once | INTRAVENOUS | Status: AC | PRN
  Administered 2020-05-22: 20 mL via INTRAVENOUS

## 2020-05-25 ENCOUNTER — Telehealth: Payer: Self-pay | Admitting: Neurology

## 2020-05-25 NOTE — Telephone Encounter (Signed)
I called the patient.  MRA of the head and neck were unremarkable with exception of some atherosclerotic changes versus artifact seen in the carotid siphons bilaterally, left greater than right.  A 2D echocardiogram is pending.    MRA head 05/23/20:  IMPRESSION:   MRA head (without) demonstrating: - Decreased flow signal within the bilateral lparaclinoid internal carotid arteries (left worse than right) with normal appearing proximal and terminal ICA flow signals.  This may represent atherosclerosis and stenosis versus artifactual changes.    MRA neck 05/23/20:  IMPRESSION:   MRA neck with and without contrast demonstrating: -Extracranial carotid arteries have no stenosis.  Vertebral arteries have no stenosis. -Slightly decreased flow signal within left paraclinoid internal carotid artery intracranially as noted on MRA head from same day.

## 2020-05-28 ENCOUNTER — Encounter: Payer: Self-pay | Admitting: *Deleted

## 2020-05-28 ENCOUNTER — Encounter: Payer: Self-pay | Admitting: Hematology and Oncology

## 2020-05-28 ENCOUNTER — Telehealth: Payer: Self-pay | Admitting: Pharmacist

## 2020-05-28 ENCOUNTER — Telehealth: Payer: Self-pay

## 2020-05-28 ENCOUNTER — Other Ambulatory Visit: Payer: Self-pay

## 2020-05-28 ENCOUNTER — Other Ambulatory Visit (HOSPITAL_COMMUNITY): Payer: Self-pay

## 2020-05-28 ENCOUNTER — Inpatient Hospital Stay (HOSPITAL_BASED_OUTPATIENT_CLINIC_OR_DEPARTMENT_OTHER): Payer: BC Managed Care – PPO | Admitting: Hematology and Oncology

## 2020-05-28 VITALS — BP 124/58 | HR 98 | Temp 97.5°F | Resp 18 | Ht 64.0 in | Wt 124.0 lb

## 2020-05-28 DIAGNOSIS — C641 Malignant neoplasm of right kidney, except renal pelvis: Secondary | ICD-10-CM

## 2020-05-28 DIAGNOSIS — I2699 Other pulmonary embolism without acute cor pulmonale: Secondary | ICD-10-CM

## 2020-05-28 DIAGNOSIS — R634 Abnormal weight loss: Secondary | ICD-10-CM

## 2020-05-28 DIAGNOSIS — I639 Cerebral infarction, unspecified: Secondary | ICD-10-CM | POA: Diagnosis not present

## 2020-05-28 MED ORDER — APIXABAN 5 MG PO TABS: 5.0000 mg | ORAL_TABLET | Freq: Two times a day (BID) | ORAL | 3 refills | Status: AC

## 2020-05-28 MED ORDER — MIRTAZAPINE 15 MG PO TABS: 15.0000 mg | ORAL_TABLET | Freq: Every day | ORAL | 0 refills | Status: AC

## 2020-05-28 MED ORDER — AXITINIB 5 MG PO TABS
5.0000 mg | ORAL_TABLET | Freq: Two times a day (BID) | ORAL | 11 refills | Status: AC
  Filled 2020-05-28 – 2020-05-30 (×2): qty 60, 30d supply, fill #0

## 2020-05-28 MED ORDER — AXITINIB 5 MG PO TABS: 5.0000 mg | ORAL_TABLET | Freq: Two times a day (BID) | ORAL | 11 refills | Status: DC

## 2020-05-28 MED ORDER — LIDOCAINE-PRILOCAINE 2.5-2.5 % EX CREA: TOPICAL_CREAM | CUTANEOUS | 3 refills | Status: AC

## 2020-05-28 MED ORDER — ONDANSETRON HCL 8 MG PO TABS: 8.0000 mg | ORAL_TABLET | Freq: Two times a day (BID) | ORAL | 1 refills | Status: AC | PRN

## 2020-05-28 MED ORDER — PROCHLORPERAZINE MALEATE 10 MG PO TABS: 10.0000 mg | ORAL_TABLET | Freq: Four times a day (QID) | ORAL | 1 refills | Status: AC | PRN

## 2020-05-28 NOTE — Assessment & Plan Note (Signed)
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  She has been seen by neurology.  She continues on Eliquis at this time.  She also has a follow-up with them.

## 2020-05-28 NOTE — Telephone Encounter (Signed)
Oral Oncology Patient Advocate Encounter  Prior Authorization for [name] has been approved.    PA# [id number] Effective dates: 05/28/20 through 05/28/21  Patients co-pay is $0  Oral Oncology Clinic will continue to follow.   [place] Patient [name] Phone [phone] Fax [phone] 05/28/2020 10:40 AM

## 2020-05-28 NOTE — Progress Notes (Signed)
[place] Work  Clinical Social Work received referral from medical oncology for advance directives.  CSW contacted patient at home to offer support and information on [place] and Living Will.  Patient stated her son has the paperwork in his suitcase and they planned to go over and discuss.  CSW provided patient with dates and information on the Advance Directives clinic at the [facility].  Patient stated she would talk to her family and call to schedule as needed.  CSW provided contact information and encouraged patient to call with questions or concerns.    Johnnye Lana, MSW, LCSW, OSW-C Clinical Social Worker [facility] [phone]

## 2020-05-28 NOTE — Assessment & Plan Note (Addendum)
This is a very pleasant 60 yr old female patient with newly diagnosed metastatic RCC who is here to discuss pathology results and treatment recommendations. Given metastatic poor risk RCC, I recommended first line axitinb plus pembro. We discussed mechanism of action, adverse reactions including but not limited to fatigue, nausea, diarrhea, hypo-/hyperthyroidism, hepatitis, nephritis.  I explained to her that about 2% of the patients can have fatal side effects.  We have discussed this in detail during her last visit as well.  With exception of the main side effect will be hypertension.  I have recommended that she make a log of her blood pressure and reported back to me every time she comes back for follow-up.  Therapy plan has been placed.  Discussed with her oral pharmacist about axitinib. Chemo education will be scheduled.  We anticipate initiating treatment next week.   Son-in-law was present at the time of the visit, daughter was present via video visit.

## 2020-05-28 NOTE — Progress Notes (Signed)
START ON PATHWAY REGIMEN - Renal Cell     A cycle is every 21 days:     Axitinib      Pembrolizumab   **Always confirm dose/schedule in your pharmacy ordering system**  Patient Characteristics: Stage IV/Metastatic Disease, Clear Cell, First Line, Intermediate or Poor Risk Therapeutic Status: Stage IV/Metastatic Disease Histology: Clear Cell Line of Therapy: First Line Risk Status: Poor Risk Intent of Therapy: Non-Curative / Palliative Intent, Discussed with Patient

## 2020-05-28 NOTE — Assessment & Plan Note (Signed)
Mostly this is secondary to lack of appetite.  Daughter was wondering if he can try something.  We will prescribe Remeron 15 mg nightly and monitor.

## 2020-05-28 NOTE — Telephone Encounter (Signed)
Oral Oncology Pharmacist Encounter  Received new prescription for Inlyta (axitinib) for the treatment of metastatic renal cell carcinoma in conjunction with pembrolizumab, planned duration until disease progression or unacceptable drug toxicity.  Prescription dose and frequency assessed for appropriateness. Appropriate for therapy initiation.   CMP and CBC w/ Diff from 05/17/20 assessed, noted AST uptrended to 90 U/L - recommend close monitoring of hepatic function while on Inlyta - no hepatic dose adjustment required at this time. Recommend close monitoring of electrolytes while on Inlyta. BP on 05/28/20 124/58 - MD has instructed pt to keep log of blood pressure while on Inlyta.    Current medication list in Epic reviewed, no relevant/significant DDIs with Inlyta identified.  Evaluated chart and no patient barriers to medication adherence noted.   Patient agreement for treatment (consent attestation) documented in treatment plan.  Prescription has been e-scribed to the [facility] for benefits analysis and approval.  Oral Oncology Clinic will continue to follow for insurance authorization, copayment issues, initial counseling and start date.  Leron Croak, PharmD, BCPS Hematology/Oncology Clinical Pharmacist [facility] [phone] 05/28/2020 9:55 AM

## 2020-05-28 NOTE — Telephone Encounter (Signed)
Oral Oncology Patient Advocate Encounter  Received notification from [place] that prior authorization for Inlyta is required.  PA submitted on CoverMyMeds Key [id number] Status is pending  Oral Oncology Clinic will continue to follow.   [name] Patient [place] Phone [phone] Fax [phone] 05/28/2020 9:50 AM

## 2020-05-28 NOTE — Progress Notes (Signed)
[place] NOTE  Patient Care Team: Josetta Huddle, MD as PCP - General (Internal Medicine) Benay Pike, MD as Consulting Physician (Hematology and Oncology)  CHIEF COMPLAINTS/PURPOSE OF CONSULTATION:   FU for metastatic RCC.  ASSESSMENT & PLAN:   Metastatic renal cell carcinoma ([place]) This is a very pleasant 60 yr old female patient with newly diagnosed metastatic RCC who is here to discuss pathology results and treatment recommendations. Given metastatic poor risk RCC, I recommended first line axitinb plus pembro. We discussed mechanism of action, adverse reactions including but not limited to fatigue, nausea, diarrhea, hypo-/hyperthyroidism, hepatitis, nephritis.  I explained to her that about 2% of the patients can have fatal side effects.  We have discussed this in detail during her last visit as well.  With exception of the main side effect will be hypertension.  I have recommended that she make a log of her blood pressure and reported back to me every time she comes back for follow-up.  Therapy plan has been placed.  Discussed with her oral pharmacist about axitinib. Chemo education will be scheduled.  We anticipate initiating treatment next week.   Son-in-law was present at the time of the visit, daughter was present via video visit.  Weight loss, abnormal Mostly this is secondary to lack of appetite.  Daughter was wondering if he can try something.  We will prescribe Remeron 15 mg nightly and monitor.  Pulmonary embolism ([place]) She complains of some shortness of breath when she lays down.  No major effusion noted on exam today.  Her last imaging also did not suggest presence of pleural effusion. She will continue Eliquis at this time.  CVA (cerebral vascular accident) [facility]) This was noted as a subacute infarct on the MRI brain which we performed for staging the renal cell carcinoma.  She has been seen by neurology.  She continues on Eliquis at this time.   She also has a follow-up with them.  HISTORY OF PRESENTING ILLNESS:   [name] 60 y.o. female was initially referred to [place] for evaluation of anemia. No evidence of MGUS or MM BMB negative without any evidence of dysplasia or increased blasts.Normal cellular bone marrow with trilineage hematopoiesis with maturation.  Flow negative.  We recommended proceeding with GI evaluation for further management of anemia. As a part of this work up, we recommended imaging of the abdomen CT abdomen showed large right renal mass with renal venous extension, direct hepatic invasion, hepatic and pulmonary metastatic disease likely reflecting renal cell carcinoma. Pathology confirmed metastatic clear cell RCC with abundant necrosis. She is also on eliquis for PE.  Interval History  Patient is here for follow-up with her son-in-law.  Daughter present via video visit.  She feels tired, has not been eating very well and has continued to lose weight. She denies any fevers.  She does notice some difficulty breathing when she lays down to sleep. Some constipation, last bowel movement 3 to 4 days ago, no nausea, vomiting, abdominal pain, fevers or chills. No change in urinary habits.  No new falls or loss of balance.  She has followed up with her neurologist since her last visit.  She continues on Eliquis, no bleeding complaints.  MEDICAL HISTORY:  Past Medical History:  Diagnosis Date  . Hypertension   . Stroke ([place])   . Vaginal prolapse     SURGICAL HISTORY: Past Surgical History:  Procedure Laterality Date  . ABDOMINAL HYSTERECTOMY    . CYSTOCELE REPAIR    .  RECTOCELE REPAIR    . TUBAL LIGATION      SOCIAL HISTORY: Social History   Socioeconomic History  . Marital status: Married    Spouse name: Ronalee Belts  . Number of children: Not on file  . Years of education: Not on file  . Highest education level: Not on file  Occupational History  . Occupation: Part time  Tobacco Use  . Smoking  status: Former Smoker    Quit date: 11/12/1981    Years since quitting: 38.5  . Smokeless tobacco: Never Used  Vaping Use  . Vaping Use: Never used  Substance and Sexual Activity  . Alcohol use: Yes  . Drug use: No  . Sexual activity: Not on file  Other Topics Concern  . Not on file  Social History Narrative   Lives with husband   Right Handed   Drinks 6-8 cups caffeine daily   Social Determinants of Health   Financial Resource Strain: Not on file  Food Insecurity: Not on file  Transportation Needs: Not on file  Physical Activity: Not on file  Stress: Not on file  Social Connections: Not on file  Intimate Partner Violence: Not on file    FAMILY HISTORY: Family History  Problem Relation Age of Onset  . COPD Mother   . Hypertension Mother   . Leukemia Father        chronic mylemonocytic leukemia  . Colon cancer Paternal Grandfather     ALLERGIES:  is allergic to amlodipine and crestor [rosuvastatin].  MEDICATIONS:  Current Outpatient Medications  Medication Sig Dispense Refill  . apixaban (ELIQUIS) 5 MG TABS tablet Take 1 tablet (5 mg total) by mouth 2 (two) times daily. 60 tablet 3  . mirtazapine (REMERON) 15 MG tablet Take 1 tablet (15 mg total) by mouth at bedtime. 30 tablet 0  . axitinib (INLYTA) 5 MG tablet Take 1 tablet (5 mg total) by mouth 2 (two) times daily. 60 tablet 11  . fenofibrate (TRICOR) 145 MG tablet Take 1 tablet by mouth daily.    Marland Kitchen lidocaine-prilocaine (EMLA) cream Apply to affected area once 30 g 3  . losartan-hydrochlorothiazide (HYZAAR) 100-25 MG tablet Take 1 tablet by mouth daily.    . Multiple Vitamin (ONE DAILY) tablet Take 1 tablet by mouth daily.    . ondansetron (ZOFRAN) 8 MG tablet Take 1 tablet (8 mg total) by mouth 2 (two) times daily as needed (Nausea or vomiting). 30 tablet 1  . prochlorperazine (COMPAZINE) 10 MG tablet Take 1 tablet (10 mg total) by mouth every 6 (six) hours as needed (Nausea or vomiting). 30 tablet 1   No  current facility-administered medications for this visit.     PHYSICAL EXAMINATION: ECOG PERFORMANCE STATUS: 0 - Asymptomatic  Vitals:   05/28/20 0839  BP: (!) 124/58  Pulse: 98  Resp: 18  Temp: (!) 97.5 F (36.4 C)  SpO2: 100%   Filed Weights   05/28/20 0839  Weight: 124 lb (56.2 kg)    Physical Exam Constitutional:      Appearance: Normal appearance.  HENT:     Head: Normocephalic and atraumatic.  Pulmonary:     Effort: Pulmonary effort is normal.     Breath sounds: Normal breath sounds.  Abdominal:     General: Abdomen is flat. There is no distension.     Palpations: Abdomen is soft.     Tenderness: There is no abdominal tenderness.  Musculoskeletal:        General: No swelling or tenderness. Normal  range of motion.     Cervical back: Normal range of motion and neck supple. No rigidity.  Lymphadenopathy:     Cervical: No cervical adenopathy.  Neurological:     Mental Status: She is alert.  Psychiatric:        Mood and Affect: Mood normal.      LABORATORY DATA:  I have reviewed the data as listed Lab Results  Component Value Date   WBC 8.6 05/17/2020   HGB 9.0 (L) 05/17/2020   HCT 30.4 (L) 05/17/2020   MCV 81.3 05/17/2020   PLT 457 (H) 05/17/2020     Chemistry      Component Value Date/Time   NA 132 (L) 05/17/2020 1145   K 5.1 05/17/2020 1145   CL 97 (L) 05/17/2020 1145   CO2 25 05/17/2020 1145   BUN 25 (H) 05/17/2020 1145   CREATININE 1.14 (H) 05/17/2020 1145   CREATININE 0.94 05/07/2020 0949      Component Value Date/Time   CALCIUM 11.3 (H) 05/17/2020 1145   ALKPHOS 84 05/17/2020 1145   AST 90 (H) 05/17/2020 1145   AST 45 (H) 05/07/2020 0949   ALT 37 05/17/2020 1145   ALT 21 05/07/2020 0949   BILITOT 0.9 05/17/2020 1145   BILITOT 0.4 05/07/2020 0949     Reviewed most recent labs. No evidence of nutritional deficiency No MGUS LDH high but retic count normal.  RADIOGRAPHIC STUDIES: I have personally reviewed the radiological images  as listed and agreed with the findings in the report. MR ANGIO HEAD WO CONTRAST  Result Date: 05/23/2020 [facility] NEUROIMAGING REPORT STUDY DATE: 05/22/2020 PATIENT NAME: [name] DOB: 08-09-1960 MRN: [id number] ORDERING CLINICIAN: Kathrynn Ducking, MD CLINICAL HISTORY: 60 year old female with stroke. EXAM: MR ANGIO HEAD WO CONTRAST TECHNIQUE: MR angiogram of the head was obtained utilizing 3D time of flight sequences from below the vertebrobasilar junction up to the intracranial vasculature without contrast.  Computerized reconstructions were obtained. CONTRAST: none COMPARISON: none IMAGING SITE: [facility] [address] (1.5 Tesla MRI) FINDINGS: This study is of adequate technical quality. Decreased flow signal within the bilateral left worse than right paraclinoid internal carotid arteries with normal appearing proximal and terminal ICA flow signals.  This may represent atherosclerosis and stenosis versus artifactual changes.  Flow signal of the bilateral internal carotid arteries have no stenosis. The bilateral middle and anterior cerebral arteries have no stenosis. The bilateral vertebral, basilar, bilateral posterior cerebral arteries have no stenosis. No aneurysmal dilatations are seen.   MRA head (without) demonstrating: - Decreased flow signal within the bilateral lparaclinoid internal carotid arteries (left worse than right) with normal appearing proximal and terminal ICA flow signals.  This may represent atherosclerosis and stenosis versus artifactual changes. INTERPRETING PHYSICIAN: Penni Bombard, MD Certified in Neurology, Neurophysiology and Neuroimaging [facility] [address] [place], [place] [address] [phone]   MR ANGIO NECK W WO CONTRAST  Result Date: 05/23/2020 [facility] NEUROIMAGING REPORT STUDY DATE: 05/22/2020 PATIENT NAME: [name] DOB: 1960-11-09 MRN: [id number] ORDERING  CLINICIAN: Kathrynn Ducking, MD CLINICAL HISTORY: 60 year old female with stroke. EXAM: MR ANGIO NECK W WO CONTRAST TECHNIQUE: MR angiogram of the neck was obtained utilizing 3D time-of-flight sequences from the aortic arch up to the intracranial vasculature postbolus contrast infusion.  2D time-of-flight noncontrast views and computerized reconstructions were obtained. CONTRAST: 20 mL MultiHance COMPARISON: none IMAGING SITE: [facility] [address] (1.5 Tesla MRI) FINDINGS: This study is of adequate  technical quality.  There is anterograde flow in the bilateral vertebral and carotid arteries on 2D-TOF views.  The flow signal of the left subclavian artery has no stenosis.  The left common, internal and external carotid arteries have no stenosis.  The left vertebral artery has no stenosis from its origin up to the vertebrobasilar junction.  On the right brachiocephalic trunk and subclavian arteries have no stenosis. The right common, internal and external carotid arteries have no stenosis. The right vertebral artery has no stenosis from its origin to the vertebrobasilar junction. Limited views of the intracranial vasculature are notable for slightly decreased flow signal within the left paraclinoid internal carotid artery as noted on MRA head from same day.   MRA neck with and without contrast demonstrating: -Extracranial carotid arteries have no stenosis.  Vertebral arteries have no stenosis. -Slightly decreased flow signal within left paraclinoid internal carotid artery intracranially as noted on MRA head from same day. INTERPRETING PHYSICIAN: Penni Bombard, MD Certified in Neurology, Neurophysiology and Neuroimaging [facility] [address] [place], [place] [address] [phone]   MR Brain W Wo Contrast  Result Date: 05/11/2020 CLINICAL DATA:  Renal cancer staging. EXAM: MRI HEAD WITHOUT AND WITH CONTRAST TECHNIQUE: Multiplanar, multiecho pulse sequences  of the brain and surrounding structures were obtained without and with intravenous contrast. CONTRAST:  81mL GADAVIST GADOBUTROL 1 MMOL/ML IV SOLN COMPARISON:  07/01/2004 FINDINGS: Brain: Patchy weakly restricted diffusion in the left thalamus and along the left frontal parietal cortex. Rounded area of similar signal is seen in the right centrum semiovale and subcortical right frontal white matter. Patchy enhancement is seen in the medial left thalamus. Subcentimeter nodular focus of enhancement at left occipital cortex on 18:54. Nodular focus of enhancement at the high left frontal convexity measuring up to 5 mm, which could be dural or cortical, new from prior. Curvilinear enhancement along the high and anterior right a parietal cortex marked on series 18. 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 Even smaller arachnoid cyst in the high anterior left frontal region. Vascular: Normal flow voids and vascular enhancements. Skull and upper cervical spine: No noted metastatic disease. Sinuses/Orbits: Negative Other: These results will be called to the ordering clinician or representative by the Radiologist Assistant, and communication documented in the PACS or [employer]. IMPRESSION: 1. Findings of subacute infarction in the left PCA and right ACA/MCA territories. 2. Areas of left thalamic and bilateral cortical enhancement are likely related to subacute infarction. 3. Indeterminate nodular focus of enhancement arising from cortex or dura along the high left frontal convexity at measuring 5 mm. 4. Recommend brain MRI with contrast in 6-12 weeks to allow for resolution of infarct related enhancement. Electronically Signed   By: Monte Fantasia M.D.   On: 05/11/2020 09:10   CT CHEST ABDOMEN PELVIS W CONTRAST  Addendum Date: 05/04/2020   ADDENDUM REPORT: 05/04/2020 09:36 ADDENDUM: These results were called by telephone at the time of  interpretation on 05/04/2020 at 9:36 am to provider Vicie Mutters PA , who verbally acknowledged these results. Electronically Signed   By: Zetta Bills M.D.   On: 05/04/2020 09:36   Result Date: 05/04/2020 CLINICAL DATA:  Weight loss and epigastric pain in a 60 year old female, 35 pound weight loss since August. History of cystocele and rectocele EXAM: CT CHEST, ABDOMEN, AND PELVIS WITH CONTRAST TECHNIQUE: Multidetector CT imaging of the chest, abdomen and pelvis was  performed following the standard protocol during bolus administration of intravenous contrast. CONTRAST:  143mL ISOVUE-300 IOPAMIDOL (ISOVUE-300) INJECTION 61% COMPARISON:  None FINDINGS: CT CHEST FINDINGS Cardiovascular: Normal caliber thoracic aorta. Scattered atheromatous plaque. Normal heart size without pericardial effusion. RIGHT lower lobe pulmonary emboli best seen on image 95 and 96 of series 5 also seen on sagittal reconstructions, image 70 and 72 no sign of RIGHT heart strain with segmental and subsegmental emboli in the RIGHT lower lobe. Mediastinum/Nodes: No thoracic inlet, mediastinal or axillary lymphadenopathy. No hilar adenopathy. Lungs/Pleura: Multifocal masses and nodules in the chest. LEFT lower lobe pulmonary mass (image 77, series 4) 4.3 x 3.2 cm. RIGHT lower lobe pulmonary nodule (image 69, series 4) 2.4 cm. Greater than 15 nodules in the RIGHT chest. Relative sparing of the LEFT upper lobe with numerous nodules also in the LEFT lower lobe in addition to the mass outlined above. A second mass in the LEFT lower lobe measures 3.3 x 2.5 cm. Other nodule seen in the bilateral chest between 5 mm and 2 cm. Airways are patent. No effusion. Musculoskeletal: See below for full musculoskeletal details. CT ABDOMEN PELVIS FINDINGS Hepatobiliary: Signs of hepatic metastatic disease and or direct hepatic extension from large hepatorenal mass that appears to arise from the RIGHT kidney. Infiltrative hypervascular process extending from the  RIGHT kidney and inseparable from the RIGHT posterior hepatic margin. Just above the hepatic hilum extending into or with mass effect upon and invasion of RIGHT hepatic lobe is the largest area of this mass measuring 12.2 x 11.5 cm. Anterior RIGHT hepatic lobe (image 44, series 2) 6.3 x 5.0 cm. Medial segment LEFT hepatic lobe (image 44, series 2) 1.7 cm. Other tiny lesions scattered throughout the LEFT hepatic lobe, at least 2 additional in the cephalad LEFT hemi liver, medial and lateral segment with a larger lesion in hepatic subsegment IV B measuring 2.7 cm on image 68. Nearly 4 cm lesion along the capsule in the RIGHT hepatic lobe (image 82, series 2) 3.9 cm. Portal vein remains patent. Thrombus, likely tumor thrombus in the RIGHT hepatic vein that appears to extend directly from the dominant a mass lesion (image 43, series 2) this extends cephalad into the lower RIGHT atrium. Additional thrombus is noted in the IVC on image 46. This likely extends from the mass proper in the kidney where there is gross renal venous expansion and extension. Pancreas: Normal, without mass, inflammation or ductal dilatation. Spleen: Normal spleen. Adrenals/Urinary Tract: LEFT adrenal is normal. LEFT kidney with small cyst in the interpolar aspect. No hydronephrosis. Urinary bladder decompressed. Large mass arising from the RIGHT kidney and extending cephalad into the RIGHT liver, associated with renal veinous and hepatic venous thrombus, tumor thrombus expands the RIGHT renal vein. Mass measures 18 x 12 x 12 cm greatest axial dimension. Mass associated with necrotic adenopathy also in the retroperitoneum, high intra-aortocaval lymph node for example on image 63 of series 2 measures 15 mm short axis. Stomach/Bowel: No sign of bowel obstruction or acute bowel process. Vascular/Lymphatic: Adenopathy in the high intra-aortocaval groove as discussed. Normal caliber abdominal aorta with calcified atheromatous plaque and noncalcified  plaque. Tumor thrombus extending into the IVC and hepatic veins as described. Smooth contour of the IVC below venous involvement. No periaortic adenopathy. Nodular soft tissue seen in the RIGHT retroperitoneum anterior to the psoas on image 87 of series 2 measuring 4.4 x 1.5 cm, some of these areas likely reflect venous collateral pathways though tumor extension into these areas is not  excluded. Reproductive: Post sacral colpopexy and hysterectomy. No adnexal masses on the LEFT. Ovarian vein tracks to either collateral pathways in the RIGHT retroperitoneum, more likely a mixture of tumor extension and collateral pathways involving RIGHT ovarian vein. No ascites. Other: No ascites or free air. Retroperitoneal nodularity and collateral pathways as discussed. Musculoskeletal: No acute musculoskeletal process. No destructive bone finding. IMPRESSION: 1. Large RIGHT renal mass with renal venous extension, direct hepatic invasion, hepatic and pulmonary metastatic disease likely reflecting renal cell carcinoma. 2. 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 3. Bland thrombus also likely present above as there are segmental emboli in the RIGHT lower lobe pulmonary arterial branches best seen on image 95 of series 5. 4. Nodular soft tissue in the RIGHT retroperitoneum anterior to the psoas muscle may reflect a mixture of tumor extension and collateral pathways involving RIGHT ovarian vein. 5. Aortic atherosclerosis. A call is out to the referring provider to further discuss findings in the above case. Aortic Atherosclerosis (ICD10-I70.0). Electronically Signed: By: Zetta Bills M.D. On: 05/04/2020 09:25   US BIOPSY (LIVER)  Result Date: 05/17/2020 INDICATION: 60 year old with a large right renal mass and multiple liver and pulmonary lesions. Findings are compatible with metastatic disease and patient needs a tissue diagnosis. EXAM:  ULTRASOUND-GUIDED LIVER LESION BIOPSY MEDICATIONS: None. ANESTHESIA/SEDATION: Moderate (conscious) sedation was employed during this procedure. A total of Versed 2.0 mg and Fentanyl 100 mcg was administered intravenously. Moderate Sedation Time: 14 minutes. The patient's level of consciousness and vital signs were monitored continuously by radiology nursing throughout the procedure under my direct supervision. FLUOROSCOPY TIME:  None COMPLICATIONS: None immediate. PROCEDURE: Informed written consent was obtained from the patient after a thorough discussion of the procedural risks, benefits and alternatives. All questions were addressed. Maximal Sterile Barrier Technique was utilized including caps, mask, sterile gowns, sterile gloves, sterile drape, hand hygiene and skin antiseptic. A timeout was performed prior to the initiation of the procedure. Liver was evaluated with ultrasound. Lesion in the inferior right hepatic lobe was identified and targeted for biopsy. Abdomen was prepped with chlorhexidine and sterile field was created. Skin and soft tissues were anesthetized using 1% lidocaine. Small incision was made. Using ultrasound guidance, 17 gauge coaxial needle was directed into the right hepatic lobe and lesion. Core biopsies were obtained with an 18 gauge core device. Specimens placed in formalin. Gel-Foam slurry was injected through the 17 gauge needle as it was removed. No immediate bleeding. Bandage placed over the puncture site. FINDINGS: Several heterogeneous lesions in the liver compatible with known findings. Biopsy needle was directed into an inferior right hepatic lesion. No immediate bleeding or hematoma formation. IMPRESSION: Ultrasound-guided core biopsy of a right hepatic lesion. Electronically Signed   By: Markus Daft M.D.   On: 05/17/2020 17:12    04/16/2020    DIAGNOSIS:   BONE MARROW; FLOW CYTOMETRIC ANALYSIS:  - No monoclonal B-cell or immunophenotypically aberrant T-cell   population identified  - No increase in blasts  - See comment    COMMENT:   Correlation with comprehensive bone marrow evaluation is recommended for  complete interpretation and overall blast enumeration (see [id number]).    GATING AND PHENOTYPIC ANALYSIS:   Gated population: Flow cytometric immunophenotyping is performed using  antibodies to the antigens listed in the table below. Electronic gates  are placed around a cell cluster displaying light scatter properties  corresponding to: lymphocytes  Abnormal Cells in gated population: N/A   Phenotype of Abnormal Cells: N/A   Clinical History: normocytic anemia     Surgical Pathology  DIAGNOSIS:   BONE MARROW, ASPIRATE, CLOT, CORE:  - Normocellular bone marrow with trilineage hematopoiesis with  maturation  - See comment   PERIPHERAL BLOOD:  - Normocytic anemia  - Thrombocytosis  - See CBC data    COMMENT:   There is no significant dysplasia or increase in blasts. No ring  sideroblasts are identified on a Prussian blue stain of an aspirate  smear. There is no morphologic or flow cytometric evidence of an  atypical lymphoid population. Plasma cells are not increased by CD138  immunohistochemistry and show polytypic light chain expression by  kappa/lambda in situ hybridization. Correlation with clinical findings,  other laboratory data including CBC trends, and cytogenetic results is  recommended.    Benay Pike, MD 05/28/2020 9:32 AM  CT chest/abdomen/pelvis with contrast  IMPRESSION: 1. Large RIGHT renal mass with renal venous extension, direct hepatic invasion, hepatic and pulmonary metastatic disease likely reflecting renal cell carcinoma. 2. 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 3. Bland thrombus also likely present above as there are segmental emboli in the RIGHT lower lobe pulmonary  arterial branches best seen on image 95 of series 5. 4. Nodular soft tissue in the RIGHT retroperitoneum anterior to the psoas muscle may reflect a mixture of tumor extension and collateral pathways involving RIGHT ovarian vein. 5. Aortic atherosclerosis.  Pathology  FINAL MICROSCOPIC DIAGNOSIS:   A. LIVER, RIGHT LOBE, BIOPSY:  - Metastatic renal cell carcinoma with extensive necrosis.  - See comment.   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 There is marked nuclear atypia consistent with WHO grade 4.  The morphology and immunophenotype are consistent with metastatic renal  cell carcinoma, mostly clear cell type. The eosinophilic component  suggests eosinophilic variant of clear cell; however, a rhabdoid  component cannot be ruled out.   Time  I spent 40 minutes in the care of this patient including history, physical, review of records, pathology, treatment recommendations, placing treatment plan, counseling and coordination of care with her oral pharmacist.  Benay Pike MD

## 2020-05-28 NOTE — Assessment & Plan Note (Signed)
She complains of some shortness of breath when she lays down.  No major effusion noted on exam today.  Her last imaging also did not suggest presence of pleural effusion. She will continue Eliquis at this time.

## 2020-05-29 ENCOUNTER — Other Ambulatory Visit (HOSPITAL_COMMUNITY): Payer: Self-pay

## 2020-05-29 ENCOUNTER — Telehealth: Payer: Self-pay | Admitting: Hematology and Oncology

## 2020-05-29 NOTE — Telephone Encounter (Signed)
Scheduled per 04/25 los, patient has been called and notified of upcoming appointments.

## 2020-05-30 ENCOUNTER — Other Ambulatory Visit (HOSPITAL_COMMUNITY): Payer: Self-pay

## 2020-05-30 NOTE — Telephone Encounter (Signed)
Oral Chemotherapy Pharmacist Encounter  I spoke with patient for overview of: Inlyta (axitinib) for the treatment metastatic renal cell carcinoma in conjunction with pembrolizumab, planned duration until disease progression or unacceptable toxicity.   Counseled patient on administration, dosing, side effects, monitoring, drug-food interactions, safe handling, storage, and disposal.  Patient will take Inlyta 5mg  tablets, 1 tablet by mouth twice daily, without regard to food, with a glass of water.  Patient instructed to avoid grapefruit and grapefruit juice while on therapy with Inlyta.  Inlyta start date: 06/04/20  Adverse effects include but are not limited to: hypertension, hand-foot syndrome, nausea, vomiting, diarrhea, fatigue, dysphonia (hoarseness), and abnormal laboratory values.   Patient will obtain anti diarrheal and alert the office of 4 or more loose stools above baseline.  Inlyta will be held at least 24 hours prior to surgery and resumed at discretion of treating physician based on wound healing  Reviewed with patient importance of keeping a medication schedule and plan for any missed doses. No barriers to medication adherence identified.  Medication reconciliation performed and medication/allergy list updated.  Insurance authorization for [employer] has been obtained. Test claim at the pharmacy revealed copayment $0 for 1st fill of Inlyta. Patient will pick this up from the [facility] on 05/30/20.  Patient informed the pharmacy will reach out 5-7 days prior to needing next fill of Inlyta to coordinate continued medication acquisition to prevent break in therapy.  All questions answered.  Ms. Dix voiced understanding and appreciation.   Medication education handout placed in mail for patient. Patient knows to call the office with questions or concerns. Oral Chemotherapy Clinic phone number provided to patient.   Leron Croak, PharmD,  BCPS Hematology/Oncology Clinical Pharmacist [facility] [phone] 05/30/2020 11:17 AM

## 2020-06-01 ENCOUNTER — Inpatient Hospital Stay: Payer: BC Managed Care – PPO

## 2020-06-01 ENCOUNTER — Other Ambulatory Visit: Payer: Self-pay

## 2020-06-04 ENCOUNTER — Encounter: Payer: Self-pay | Admitting: Hematology and Oncology

## 2020-06-04 ENCOUNTER — Other Ambulatory Visit: Payer: Self-pay

## 2020-06-04 ENCOUNTER — Inpatient Hospital Stay: Payer: BC Managed Care – PPO | Attending: Hematology and Oncology | Admitting: Hematology and Oncology

## 2020-06-04 ENCOUNTER — Inpatient Hospital Stay: Payer: BC Managed Care – PPO

## 2020-06-04 VITALS — BP 139/53 | HR 89 | Temp 98.2°F | Resp 17

## 2020-06-04 DIAGNOSIS — Z79899 Other long term (current) drug therapy: Secondary | ICD-10-CM | POA: Diagnosis not present

## 2020-06-04 DIAGNOSIS — C641 Malignant neoplasm of right kidney, except renal pelvis: Secondary | ICD-10-CM

## 2020-06-04 DIAGNOSIS — Z5112 Encounter for antineoplastic immunotherapy: Secondary | ICD-10-CM | POA: Insufficient documentation

## 2020-06-04 LAB — TSH: TSH: 2.352 u[IU]/mL (ref 0.350–4.500)

## 2020-06-04 LAB — CMP (CANCER CENTER ONLY)
ALT: 19 U/L (ref 0–44)
AST: 60 U/L — ABNORMAL HIGH (ref 15–41)
Albumin: 2.8 g/dL — ABNORMAL LOW (ref 3.5–5.0)
Alkaline Phosphatase: 97 U/L (ref 38–126)
Anion gap: 8 (ref 5–15)
BUN: 15 mg/dL (ref 8–23)
CO2: 25 mmol/L (ref 22–32)
Calcium: 11.2 mg/dL — ABNORMAL HIGH (ref 8.9–10.3)
Chloride: 97 mmol/L — ABNORMAL LOW (ref 98–111)
Creatinine: 0.87 mg/dL (ref 0.44–1.00)
GFR, Estimated: >60 mL/min (ref >60)
Glucose, Bld: 238 mg/dL — ABNORMAL HIGH (ref 70–99)
Potassium: 4.2 mmol/L (ref 3.5–5.1)
Sodium: 130 mmol/L — ABNORMAL LOW (ref 135–145)
Total Bilirubin: 0.3 mg/dL (ref 0.3–1.2)
Total Protein: 6.9 g/dL (ref 6.5–8.1)

## 2020-06-04 LAB — CBC WITH DIFFERENTIAL (CANCER CENTER ONLY)
Abs Immature Granulocytes: 0.03 10*3/uL (ref 0.00–0.07)
Basophils Absolute: 0.1 10*3/uL (ref 0.0–0.1)
Basophils Relative: 1 %
Eosinophils Absolute: 0.1 10*3/uL (ref 0.0–0.5)
Eosinophils Relative: 1 %
HCT: 27.3 % — ABNORMAL LOW (ref 36.0–46.0)
Hemoglobin: 8.3 g/dL — ABNORMAL LOW (ref 12.0–15.0)
Immature Granulocytes: 0 %
Lymphocytes Relative: 23 %
Lymphs Abs: 1.5 10*3/uL (ref 0.7–4.0)
MCH: 24.3 pg — ABNORMAL LOW (ref 26.0–34.0)
MCHC: 30.4 g/dL (ref 30.0–36.0)
MCV: 79.8 fL — ABNORMAL LOW (ref 80.0–100.0)
Monocytes Absolute: 0.6 10*3/uL (ref 0.1–1.0)
Monocytes Relative: 9 %
Neutro Abs: 4.6 10*3/uL (ref 1.7–7.7)
Neutrophils Relative %: 66 %
Platelet Count: 334 10*3/uL (ref 150–400)
RBC: 3.42 MIL/uL — ABNORMAL LOW (ref 3.87–5.11)
RDW: 15.2 % (ref 11.5–15.5)
WBC Count: 6.9 10*3/uL (ref 4.0–10.5)
nRBC: 0 % (ref 0.0–0.2)

## 2020-06-04 LAB — TOTAL PROTEIN, URINE DIPSTICK: Protein, ur: NEGATIVE mg/dL

## 2020-06-04 MED ORDER — SODIUM CHLORIDE 0.9 % IV SOLN
Freq: Once | INTRAVENOUS | Status: AC
  Filled 2020-06-04: qty 250

## 2020-06-04 MED ORDER — SODIUM CHLORIDE 0.9 % IV SOLN
200.0000 mg | Freq: Once | INTRAVENOUS | Status: AC
  Administered 2020-06-04: 200 mg via INTRAVENOUS
  Filled 2020-06-04: qty 8

## 2020-06-04 NOTE — Patient Instructions (Signed)
[facility]  Discharge Instructions: Thank you for choosing [facility] to provide your oncology and hematology care.   If you have a lab appointment with the [facility], please go directly to the [facility] and check in at the registration area.   Wear comfortable clothing and clothing appropriate for easy access to any Portacath or PICC line.   We strive to give you quality time with your provider. You may need to reschedule your appointment if you arrive late (15 or more minutes).  Arriving late affects you and other patients whose appointments are after yours.  Also, if you miss three or more appointments without notifying the office, you may be dismissed from the clinic at the provider's discretion.      For prescription refill requests, have your pharmacy contact our office and allow 72 hours for refills to be completed.    Today you received the following chemotherapy and/or immunotherapy agents Pembrolizumab     To help prevent nausea and vomiting after your treatment, we encourage you to take your nausea medication as directed.  BELOW ARE SYMPTOMS THAT SHOULD BE REPORTED IMMEDIATELY: . *FEVER GREATER THAN 100.4 F (38 C) OR HIGHER . *CHILLS OR SWEATING . *NAUSEA AND VOMITING THAT IS NOT CONTROLLED WITH YOUR NAUSEA MEDICATION . *UNUSUAL SHORTNESS OF BREATH . *UNUSUAL BRUISING OR BLEEDING . *URINARY PROBLEMS (pain or burning when urinating, or frequent urination) . *BOWEL PROBLEMS (unusual diarrhea, constipation, pain near the anus) . TENDERNESS IN MOUTH AND THROAT WITH OR WITHOUT PRESENCE OF ULCERS (sore throat, sores in mouth, or a toothache) . UNUSUAL RASH, SWELLING OR PAIN  . UNUSUAL VAGINAL DISCHARGE OR ITCHING   Items with * indicate a potential emergency and should be followed up as soon as possible or go to the Emergency Department if any problems should occur.  Please show the CHEMOTHERAPY ALERT CARD or IMMUNOTHERAPY  ALERT CARD at check-in to the Emergency Department and triage nurse.  Should you have questions after your visit or need to cancel or reschedule your appointment, please contact [facility]  Dept: [phone]  and follow the prompts.  Office hours are 8:00 a.m. to 4:30 p.m. Monday - Friday. Please note that voicemails left after 4:00 p.m. may not be returned until the following business day.  We are closed weekends and major holidays. You have access to a nurse at all times for urgent questions. Please call the main number to the clinic Dept: [phone] and follow the prompts.   For any non-urgent questions, you may also contact your provider using MyChart. We now offer e-Visits for anyone 11 and older to request care online for non-urgent symptoms. For details visit [email or web address].   Also download the MyChart app! Go to the app store, search "MyChart", open the app, select [facility], and log in with your MyChart username and password.  Due to Covid, a mask is required upon entering the hospital/clinic. If you do not have a mask, one will be given to you upon arrival. For doctor visits, patients may have 1 support person aged 14 or older with them. For treatment visits, patients cannot have anyone with them due to current Covid guidelines and our immunocompromised population.   Pembrolizumab injection What is this medicine? PEMBROLIZUMAB (pem broe liz ue mab) is a monoclonal antibody. It is used to treat certain types of cancer. This medicine may be used for other purposes; ask your health care provider or pharmacist  if you have questions. COMMON BRAND NAME(S): Keytruda What should I tell my health care provider before I take this medicine? They need to know if you have any of these conditions:  autoimmune diseases like Crohn's disease, ulcerative colitis, or lupus  have had or planning to have an allogeneic stem cell transplant (uses someone else's  stem cells)  history of organ transplant  history of chest radiation  nervous system problems like myasthenia gravis or Guillain-Barre syndrome  an unusual or allergic reaction to pembrolizumab, other medicines, foods, dyes, or preservatives  pregnant or trying to get pregnant  breast-feeding How should I use this medicine? This medicine is for infusion into a vein. It is given by a health care professional in a hospital or clinic setting. A special MedGuide will be given to you before each treatment. Be sure to read this information carefully each time. Talk to your pediatrician regarding the use of this medicine in children. While this drug may be prescribed for children as young as 6 months for selected conditions, precautions do apply. Overdosage: If you think you have taken too much of this medicine contact a poison control center or emergency room at once. NOTE: This medicine is only for you. Do not share this medicine with others. What if I miss a dose? It is important not to miss your dose. Call your doctor or health care professional if you are unable to keep an appointment. What may interact with this medicine? Interactions have not been studied. This list may not describe all possible interactions. Give your health care provider a list of all the medicines, herbs, non-prescription drugs, or dietary supplements you use. Also tell them if you smoke, drink alcohol, or use illegal drugs. Some items may interact with your medicine. What should I watch for while using this medicine? Your condition will be monitored carefully while you are receiving this medicine. You may need blood work done while you are taking this medicine. Do not become pregnant while taking this medicine or for 4 months after stopping it. Women should inform their doctor if they wish to become pregnant or think they might be pregnant. There is a potential for serious side effects to an unborn child. Talk to your  health care professional or pharmacist for more information. Do not breast-feed an infant while taking this medicine or for 4 months after the last dose. What side effects may I notice from receiving this medicine? Side effects that you should report to your doctor or health care professional as soon as possible:  allergic reactions like skin rash, itching or hives, swelling of the face, lips, or tongue  bloody or black, tarry  breathing problems  changes in vision  chest pain  chills  confusion  constipation  cough  diarrhea  dizziness or feeling faint or lightheaded  fast or irregular heartbeat  fever  flushing  joint pain  low blood counts - this medicine may decrease the number of white blood cells, red blood cells and platelets. You may be at increased risk for infections and bleeding.  muscle pain  muscle weakness  pain, tingling, numbness in the hands or feet  persistent headache  redness, blistering, peeling or loosening of the skin, including inside the mouth  signs and symptoms of high blood sugar such as dizziness; dry mouth; dry skin; fruity breath; nausea; stomach pain; increased hunger or thirst; increased urination  signs and symptoms of kidney injury like trouble passing urine or change in the amount  of urine  signs and symptoms of liver injury like dark urine, light-colored stools, loss of appetite, nausea, right upper belly pain, yellowing of the eyes or skin  sweating  swollen lymph nodes  weight loss Side effects that usually do not require medical attention (report to your doctor or health care professional if they continue or are bothersome):  decreased appetite  hair loss  tiredness This list may not describe all possible side effects. Call your doctor for medical advice about side effects. You may report side effects to FDA at 1-800-FDA-1088. Where should I keep my medicine? This drug is given in a hospital or clinic and will  not be stored at home. NOTE: This sheet is a summary. It may not cover all possible information. If you have questions about this medicine, talk to your doctor, pharmacist, or health care provider.  2021 Elsevier/Gold Standard (2018-12-22 21:44:53)

## 2020-06-04 NOTE — Progress Notes (Signed)
Met with patient/accompanying adult to introduce myself as Arboriculturist and to offer available resources.  Discussed one-time $1000 Radio broadcast assistant to assist with personal expenses while going through treatment.  Also, asked if she has met ded/OOP for insurance to determine need for copay assistance. She states she has.  Gave her my card if interested in applying and for any additional financial questions or concerns.

## 2020-06-05 ENCOUNTER — Telehealth: Payer: Self-pay | Admitting: *Deleted

## 2020-06-05 ENCOUNTER — Other Ambulatory Visit: Payer: Self-pay | Admitting: Hematology and Oncology

## 2020-06-05 ENCOUNTER — Telehealth: Payer: Self-pay | Admitting: Hematology and Oncology

## 2020-06-05 LAB — T4: T4, Total: 8.3 ug/dL (ref 4.5–12.0)

## 2020-06-05 NOTE — Telephone Encounter (Signed)
-----   Message from Rolene Course, RN sent at 06/04/2020  3:35 PM EDT ----- Regarding: [place] 1st Tx F/U call - Beryle Flock [place] 1st Tx F/U call - Beryle Flock

## 2020-06-05 NOTE — Progress Notes (Signed)
I called the patient. Her corrected calcium is high from yesterday lab's. I ordered one time infusion of pamidronate. Can this be scheduled ASAP.

## 2020-06-05 NOTE — Telephone Encounter (Signed)
Scheduled appt per 5/3 sch msg. Pt aware.

## 2020-06-05 NOTE — Telephone Encounter (Signed)
Called pt to see how she did with her treatment yesterday. She reports doing well with no c/o's & reports that she knows how to reach [place] if needed.

## 2020-06-07 ENCOUNTER — Encounter (HOSPITAL_COMMUNITY): Payer: Self-pay

## 2020-06-07 ENCOUNTER — Emergency Department (HOSPITAL_COMMUNITY)
Admission: EM | Admit: 2020-06-07 | Discharge: 2020-06-07 | Disposition: A | Discharge status: Home / Self Care | Payer: BC Managed Care – PPO | Source: Home / Self Care | Attending: Emergency Medicine | Admitting: Emergency Medicine

## 2020-06-07 ENCOUNTER — Telehealth: Payer: Self-pay

## 2020-06-07 ENCOUNTER — Emergency Department (HOSPITAL_COMMUNITY): Payer: BC Managed Care – PPO

## 2020-06-07 DIAGNOSIS — R509 Fever, unspecified: Secondary | ICD-10-CM

## 2020-06-07 DIAGNOSIS — I8290 Acute embolism and thrombosis of unspecified vein: Secondary | ICD-10-CM

## 2020-06-07 DIAGNOSIS — R7881 Bacteremia: Secondary | ICD-10-CM | POA: Diagnosis not present

## 2020-06-07 DIAGNOSIS — Z79899 Other long term (current) drug therapy: Secondary | ICD-10-CM | POA: Insufficient documentation

## 2020-06-07 DIAGNOSIS — I2699 Other pulmonary embolism without acute cor pulmonale: Secondary | ICD-10-CM | POA: Insufficient documentation

## 2020-06-07 DIAGNOSIS — N390 Urinary tract infection, site not specified: Secondary | ICD-10-CM

## 2020-06-07 DIAGNOSIS — C799 Secondary malignant neoplasm of unspecified site: Secondary | ICD-10-CM

## 2020-06-07 DIAGNOSIS — Z7901 Long term (current) use of anticoagulants: Secondary | ICD-10-CM | POA: Insufficient documentation

## 2020-06-07 DIAGNOSIS — I10 Essential (primary) hypertension: Secondary | ICD-10-CM | POA: Insufficient documentation

## 2020-06-07 DIAGNOSIS — C7900 Secondary malignant neoplasm of unspecified kidney and renal pelvis: Secondary | ICD-10-CM | POA: Insufficient documentation

## 2020-06-07 DIAGNOSIS — Z87891 Personal history of nicotine dependence: Secondary | ICD-10-CM | POA: Insufficient documentation

## 2020-06-07 DIAGNOSIS — I8000 Phlebitis and thrombophlebitis of superficial vessels of unspecified lower extremity: Secondary | ICD-10-CM | POA: Insufficient documentation

## 2020-06-07 DIAGNOSIS — Z20822 Contact with and (suspected) exposure to covid-19: Secondary | ICD-10-CM | POA: Insufficient documentation

## 2020-06-07 DIAGNOSIS — R1011 Right upper quadrant pain: Secondary | ICD-10-CM | POA: Insufficient documentation

## 2020-06-07 LAB — LACTIC ACID, PLASMA
Lactic Acid, Venous: 1.5 mmol/L (ref 0.5–1.9)
Lactic Acid, Venous: 1.7 mmol/L (ref 0.5–1.9)

## 2020-06-07 LAB — APTT: aPTT: 43 seconds — ABNORMAL HIGH (ref 24–36)

## 2020-06-07 LAB — COMPREHENSIVE METABOLIC PANEL
ALT: 28 U/L (ref 0–44)
AST: 99 U/L — ABNORMAL HIGH (ref 15–41)
Albumin: 2.9 g/dL — ABNORMAL LOW (ref 3.5–5.0)
Alkaline Phosphatase: 93 U/L (ref 38–126)
Anion gap: 9 (ref 5–15)
BUN: 16 mg/dL (ref 8–23)
CO2: 23 mmol/L (ref 22–32)
Calcium: 9.2 mg/dL (ref 8.9–10.3)
Chloride: 98 mmol/L (ref 98–111)
Creatinine, Ser: 0.83 mg/dL (ref 0.44–1.00)
GFR, Estimated: >60 mL/min (ref >60)
Glucose, Bld: 161 mg/dL — ABNORMAL HIGH (ref 70–99)
Potassium: 3.7 mmol/L (ref 3.5–5.1)
Sodium: 130 mmol/L — ABNORMAL LOW (ref 135–145)
Total Bilirubin: 0.6 mg/dL (ref 0.3–1.2)
Total Protein: 6.7 g/dL (ref 6.5–8.1)

## 2020-06-07 LAB — URINALYSIS, ROUTINE W REFLEX MICROSCOPIC
Bilirubin Urine: NEGATIVE
Glucose, UA: NEGATIVE mg/dL
Ketones, ur: NEGATIVE mg/dL
Nitrite: NEGATIVE
Protein, ur: 30 mg/dL — AB
Specific Gravity, Urine: 1.011 (ref 1.005–1.030)
pH: 5 (ref 5.0–8.0)

## 2020-06-07 LAB — CBC WITH DIFFERENTIAL/PLATELET
Abs Immature Granulocytes: 0.07 10*3/uL (ref 0.00–0.07)
Basophils Absolute: 0.1 10*3/uL (ref 0.0–0.1)
Basophils Relative: 0 %
Eosinophils Absolute: 0 10*3/uL (ref 0.0–0.5)
Eosinophils Relative: 0 %
HCT: 29.5 % — ABNORMAL LOW (ref 36.0–46.0)
Hemoglobin: 9 g/dL — ABNORMAL LOW (ref 12.0–15.0)
Immature Granulocytes: 1 %
Lymphocytes Relative: 6 %
Lymphs Abs: 0.7 10*3/uL (ref 0.7–4.0)
MCH: 23.9 pg — ABNORMAL LOW (ref 26.0–34.0)
MCHC: 30.5 g/dL (ref 30.0–36.0)
MCV: 78.5 fL — ABNORMAL LOW (ref 80.0–100.0)
Monocytes Absolute: 1.1 10*3/uL — ABNORMAL HIGH (ref 0.1–1.0)
Monocytes Relative: 9 %
Neutro Abs: 9.9 10*3/uL — ABNORMAL HIGH (ref 1.7–7.7)
Neutrophils Relative %: 84 %
Platelets: 278 10*3/uL (ref 150–400)
RBC: 3.76 MIL/uL — ABNORMAL LOW (ref 3.87–5.11)
RDW: 14.9 % (ref 11.5–15.5)
WBC: 11.8 10*3/uL — ABNORMAL HIGH (ref 4.0–10.5)
nRBC: 0 % (ref 0.0–0.2)

## 2020-06-07 LAB — PROTIME-INR
INR: 2.2 — ABNORMAL HIGH (ref 0.8–1.2)
Prothrombin Time: 24.7 seconds — ABNORMAL HIGH (ref 11.4–15.2)

## 2020-06-07 LAB — RESP PANEL BY RT-PCR (FLU A&B, COVID) ARPGX2
Influenza A by PCR: NEGATIVE
Influenza B by PCR: NEGATIVE
SARS Coronavirus 2 by RT PCR: NEGATIVE

## 2020-06-07 MED ORDER — LACTATED RINGERS IV BOLUS (SEPSIS): 250.0000 mL | Freq: Once | INTRAVENOUS | Status: DC

## 2020-06-07 MED ORDER — LACTATED RINGERS IV BOLUS (SEPSIS)
1000.0000 mL | Freq: Once | INTRAVENOUS | Status: AC
  Administered 2020-06-07: 1000 mL via INTRAVENOUS

## 2020-06-07 MED ORDER — IOHEXOL 300 MG/ML  SOLN
100.0000 mL | Freq: Once | INTRAMUSCULAR | Status: AC | PRN
  Administered 2020-06-07: 100 mL via INTRAVENOUS

## 2020-06-07 MED ORDER — METRONIDAZOLE 500 MG/100ML IV SOLN
500.0000 mg | Freq: Once | INTRAVENOUS | Status: AC
  Administered 2020-06-07: 500 mg via INTRAVENOUS
  Filled 2020-06-07: qty 100

## 2020-06-07 MED ORDER — ACETAMINOPHEN 325 MG PO TABS: 650.0000 mg | ORAL_TABLET | Freq: Once | ORAL | Status: DC

## 2020-06-07 MED ORDER — LACTATED RINGERS IV SOLN: INTRAVENOUS | Status: DC

## 2020-06-07 MED ORDER — LACTATED RINGERS IV BOLUS (SEPSIS)
500.0000 mL | Freq: Once | INTRAVENOUS | Status: AC
Start: 2020-06-07 — End: 2020-06-07
  Administered 2020-06-07: 500 mL via INTRAVENOUS

## 2020-06-07 MED ORDER — SODIUM CHLORIDE 0.9 % IV SOLN
2.0000 g | Freq: Once | INTRAVENOUS | Status: AC
  Administered 2020-06-07: 2 g via INTRAVENOUS
  Filled 2020-06-07: qty 2

## 2020-06-07 MED ORDER — OXYCODONE HCL 5 MG PO TABS: 5.0000 mg | ORAL_TABLET | Freq: Once | ORAL | Status: DC

## 2020-06-07 MED ORDER — SODIUM CHLORIDE 0.9 % IV SOLN
30.0000 mg | Freq: Once | INTRAVENOUS | Status: DC
  Filled 2020-06-07: qty 10

## 2020-06-07 MED ORDER — VANCOMYCIN HCL 1250 MG/250ML IV SOLN
1250.0000 mg | Freq: Once | INTRAVENOUS | Status: AC
  Administered 2020-06-07: 1250 mg via INTRAVENOUS
  Filled 2020-06-07: qty 250

## 2020-06-07 NOTE — Progress Notes (Signed)
A consult was received from an ED physician for cefepime and vancomycin per pharmacy dosing.  The patient's profile has been reviewed for ht/wt/allergies/indication/available labs.   A one time order has been placed for cefepime 2 g and vancomycin 1250 mg.  Further antibiotics/pharmacy consults should be ordered by admitting physician if indicated.                       Thank you, Napoleon Form 06/07/2020  12:01 PM

## 2020-06-07 NOTE — ED Notes (Signed)
Provider at the bedside to evaluate. 

## 2020-06-07 NOTE — ED Notes (Signed)
Pt to bathroom via wheelchair with ED tech accompanying at this time.

## 2020-06-07 NOTE — ED Provider Notes (Signed)
Patient with fever.  On immunotherapy for renal cell carcinoma.  Lab work and imaging is overall unremarkable.  No concern for sepsis on lab work.  No source for infection.  Suspect viral process.  Talked with her oncologist who is okay with outpatient follow-up.  Blood cultures have been sent.  Patient prefers to be discharged.  Overall discharged in good condition.  Understands return precautions.  Cultures to be followed up.   Lennice Sites, DO 06/07/20 1701

## 2020-06-07 NOTE — Consult Note (Signed)
[place]  Telephone:(336) [phone] Fax:([phone]) [place]   Chief Complaint  Patient presents with  . Fever    Nausea and vomiting with fever for a "few days"    HPI  This is a very pleasant 60 year old female patient with newly diagnosed metastatic clear-cell renal cell carcinoma currently on axitinib and Keytruda first-line, first treatment started on Monday who presented to the ER with chief complaint of fever, nausea and vomiting as well as right lower quadrant abdominal pain. Patient mentions to me that she was well after the infusion and started axitinib on the same day, 06/04/2020.  Yesterday she started noticing some fevers, nausea could not keep any oral medications down.  She is also noticed that her pain in the abdomen is radiating from right upper quadrant to right lower quadrant and is worse.  She tried to take her pain medications but could not keep it down.  She denies any similar symptoms in family members.  She does remember eating out but cannot remember when or what she ate.  She denies any changes in breathing.  She is compliant with all her medications.  No lower extremity swelling.  No diarrhea.  No dysuria or hematuria.   Past Medical History:  Diagnosis Date  . Hypertension   . Stroke ([place])   . Vaginal prolapse   :  Past Surgical History:  Procedure Laterality Date  . ABDOMINAL HYSTERECTOMY    . CYSTOCELE REPAIR    . RECTOCELE REPAIR    . TUBAL LIGATION    :  Current Facility-Administered Medications  Medication Dose Route Frequency Provider Last Rate Last Admin  . ceFEPIme (MAXIPIME) 2 g in sodium chloride 0.9 % 100 mL IVPB  2 g Intravenous Once Daleen Bo, MD      . lactated ringers bolus 500 mL  500 mL Intravenous Once Daleen Bo, MD       And  . lactated ringers bolus 250 mL  250 mL Intravenous Once Daleen Bo, MD      . lactated ringers infusion   Intravenous Continuous Daleen Bo, MD      . metroNIDAZOLE (FLAGYL) IVPB 500 mg  500 mg Intravenous Once Daleen Bo, MD 100 mL/hr at 06/07/20 1151 500 mg at 06/07/20 1151  . pamidronate (AREDIA) 30 mg in sodium chloride 0.9 % 500 mL IVPB  30 mg Intravenous Once Daleen Bo, MD      . vancomycin (VANCOREADY) IVPB 1250 mg/250 mL  1,250 mg Intravenous Once Daleen Bo, MD       Current Outpatient Medications  Medication Sig Dispense Refill  . apixaban (ELIQUIS) 5 MG TABS tablet Take 1 tablet (5 mg total) by mouth 2 (two) times daily. 60 tablet 3  . axitinib (INLYTA) 5 MG tablet Take 1 tablet (5 mg total) by mouth 2 (two) times daily. 60 tablet 11  . fenofibrate (TRICOR) 145 MG tablet Take 1 tablet by mouth daily.    Marland Kitchen lidocaine-prilocaine (EMLA) cream Apply to affected area once 30 g 3  . losartan-hydrochlorothiazide (HYZAAR) 100-25 MG tablet Take 1 tablet by mouth daily.    . mirtazapine (REMERON) 15 MG tablet Take 1 tablet (15 mg total) by mouth at bedtime. 30 tablet 0  . Multiple Vitamin (ONE DAILY) tablet Take 1 tablet by mouth daily.    . ondansetron (ZOFRAN) 8 MG tablet Take 1 tablet (8 mg total) by mouth 2 (two) times daily as needed (Nausea or vomiting).  30 tablet 1  . prochlorperazine (COMPAZINE) 10 MG tablet Take 1 tablet (10 mg total) by mouth every 6 (six) hours as needed (Nausea or vomiting). 30 tablet 1     Allergies  Allergen Reactions  . Amlodipine     Other reaction(s): dizziness  . Crestor [Rosuvastatin] Other (See Comments)    Muscle cramps  :  Family History  Problem Relation Age of Onset  . COPD Mother   . Hypertension Mother   . Leukemia Father        chronic mylemonocytic leukemia  . Colon cancer Paternal Grandfather   :  Social History   Socioeconomic History  . Marital status: Married    Spouse name: Ronalee Belts  . Number of children: Not on file  . Years of education: Not on file  . Highest education level: Not on file  Occupational History  . Occupation: Part time   Tobacco Use  . Smoking status: Former Smoker    Quit date: 11/12/1981    Years since quitting: 38.5  . Smokeless tobacco: Never Used  Vaping Use  . Vaping Use: Never used  Substance and Sexual Activity  . Alcohol use: Yes  . Drug use: No  . Sexual activity: Not on file  Other Topics Concern  . Not on file  Social History Narrative   Lives with husband   Right Handed   Drinks 6-8 cups caffeine daily   Social Determinants of Health   Financial Resource Strain: Not on file  Food Insecurity: Not on file  Transportation Needs: Not on file  Physical Activity: Not on file  Stress: Not on file  Social Connections: Not on file  Intimate Partner Violence: Not on file  :   Rest of the pertinent 10 point ROS reviewed and negative.  Exam: Patient Vitals for the past 24 hrs:  BP Temp Temp src Pulse Resp SpO2 Height Weight  06/07/20 1215 (!) 147/74 -- -- 92 (!) 28 97 % -- --  06/07/20 1200 136/70 -- -- 90 19 98 % -- --  06/07/20 1130 (!) 143/62 -- -- 98 19 99 % -- --  06/07/20 1100 -- -- -- -- -- -- 5' 4"  (1.626 m) 124 lb (56.2 kg)  06/07/20 1047 (!) 127/92 (!) 101.6 F (38.7 C) Rectal (!) 102 17 96 % -- --    General: Frail-appearing..   Eyes:  no scleral icterus.   Lymphatics:  Negative cervical, supraclavicular or axillary adenopathy.   Respiratory: lungs were clear in the anterior lung fields.   Cardiovascular:  Regular rate and rhythm, S1/S2, without murmur, rub or gallop.  There was no pedal edema.   GI:  abdomen was soft, flat, mildly tender in the right lumbar and iliac region, no guarding, rigidity or rebound tenderness.   Muscoloskeletal:  no spinal tenderness of palpation of vertebral spine.   Skin exam was without echymosis, petichae.   Neuro exam was nonfocal.  Patient was alerted and oriented.  Attention was good.   Language was appropriate.  Mood was normal without depression.    Lab Results  Component Value Date   WBC 11.8 (H) 06/07/2020   HGB 9.0 (L)  06/07/2020   HCT 29.5 (L) 06/07/2020   PLT 278 06/07/2020   GLUCOSE 161 (H) 06/07/2020   ALT 28 06/07/2020   AST 99 (H) 06/07/2020   NA 130 (L) 06/07/2020   K 3.7 06/07/2020   CL 98 06/07/2020   CREATININE 0.83 06/07/2020   BUN 16 06/07/2020  CO2 23 06/07/2020    MR ANGIO HEAD WO CONTRAST  Result Date: 05/23/2020 [facility] NEURO[facility]AG[facility] REPORT STUDY DATE: 05/22/2020 PATIENT NAME: VIOLANDA BOBECK DOB: [birth year] MRN: [id number] ORDERING CLINICIAN: Kathrynn Ducking, MD CLINICAL HISTORY: 60 year old female with stroke. EXAM: MR ANGIO HEAD WO CONTRAST TECHNIQUE: MR angiogram of the head was obtained utilizing 3D time of flight sequences from below the vertebrobasilar junction up to the intracranial vasculature without contrast.  Computerized reconstructions were obtained. CONTRAST: none COMPARISON: none IMAGING SITE: [facility] [address] (1.5 Tesla MRI) FINDINGS: This study is of adequate technical quality. Decreased flow signal within the bilateral left worse than right paraclinoid internal carotid arteries with normal appearing proximal and terminal ICA flow signals.  This may represent atherosclerosis and stenosis versus artifactual changes.  Flow signal of the bilateral internal carotid arteries have no stenosis. The bilateral middle and anterior cerebral arteries have no stenosis. The bilateral vertebral, basilar, bilateral posterior cerebral arteries have no stenosis. No aneurysmal dilatations are seen.   MRA head (without) demonstrating: - Decreased flow signal within the bilateral lparaclinoid internal carotid arteries (left worse than right) with normal appearing proximal and terminal ICA flow signals.  This may represent atherosclerosis and stenosis versus artifactual changes. INTERPRETING PHYSICIAN: Penni Bombard, MD Certified in Neurology, Neurophysiology and Neuroimaging [facility] [address] [place], [place]  [address] [phone]   MR ANGIO NECK W WO CONTRAST  Result Date: 05/23/2020 [facility] NEURO[facility]AG[facility] REPORT STUDY DATE: 05/22/2020 PATIENT NAME: HAYLYN HALBERG DOB: May 23, 1960 MRN: [id number] ORDERING CLINICIAN: Kathrynn Ducking, MD CLINICAL HISTORY: 60 year old female with stroke. EXAM: MR ANGIO NECK W WO CONTRAST TECHNIQUE: MR angiogram of the neck was obtained utilizing 3D time-of-flight sequences from the aortic arch up to the intracranial vasculature postbolus contrast infusion.  2D time-of-flight noncontrast views and computerized reconstructions were obtained. CONTRAST: 20 mL MultiHance COMPARISON: none IMAGING SITE: [facility] [address] (1.5 Tesla MRI) FINDINGS: This study is of adequate technical quality.  There is anterograde flow in the bilateral vertebral and carotid arteries on 2D-TOF views.  The flow signal of the left subclavian artery has no stenosis.  The left common, internal and external carotid arteries have no stenosis.  The left vertebral artery has no stenosis from its origin up to the vertebrobasilar junction.  On the right brachiocephalic trunk and subclavian arteries have no stenosis. The right common, internal and external carotid arteries have no stenosis. The right vertebral artery has no stenosis from its origin to the vertebrobasilar junction. Limited views of the intracranial vasculature are notable for slightly decreased flow signal within the left paraclinoid internal carotid artery as noted on MRA head from same day.   MRA neck with and without contrast demonstrating: -Extracranial carotid arteries have no stenosis.  Vertebral arteries have no stenosis. -Slightly decreased flow signal within left paraclinoid internal carotid artery intracranially as noted on MRA head from same day. INTERPRETING PHYSICIAN: Penni Bombard, MD Certified in Neurology, Neurophysiology and Neuroimaging [facility] [address], [place], [place] [address] [phone]   MR Brain W Wo Contrast  Result Date: 05/11/2020 CLINICAL DATA:  Renal cancer staging. EXAM: MRI HEAD WITHOUT AND WITH CONTRAST TECHNIQUE: Multiplanar, multiecho pulse sequences of the brain and surrounding structures were obtained without and with intravenous contrast. CONTRAST:  7m GADAVIST GADOBUTROL 1 MMOL/ML IV SOLN COMPARISON:  07/01/2004 FINDINGS: Brain: Patchy weakly restricted diffusion in the left thalamus and along the left  frontal parietal cortex. Rounded area of similar signal is seen in the right centrum semiovale and subcortical right frontal white matter. Patchy enhancement is seen in the medial left thalamus. Subcentimeter nodular focus of enhancement at left occipital cortex on 18:54. Nodular focus of enhancement at the high left frontal convexity measuring up to 5 mm, which could be dural or cortical, new from prior. Curvilinear enhancement along the high and anterior right a parietal cortex marked on series 18. 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 Even smaller arachnoid cyst in the high anterior left frontal region. Vascular: Normal flow voids and vascular enhancements. Skull and upper cervical spine: No noted metastatic disease. Sinuses/Orbits: Negative Other: These results will be called to the ordering clinician or representative by the Radiologist Assistant, and communication documented in the PACS or [employer]. IMPRESSION: 1. Findings of subacute infarction in the left PCA and right ACA/MCA territories. 2. Areas of left thalamic and bilateral cortical enhancement are likely related to subacute infarction. 3. Indeterminate nodular focus of enhancement arising from cortex or dura along the high left frontal convexity at measuring 5 mm. 4. Recommend brain MRI with contrast in 6-12 weeks to allow for resolution of infarct related enhancement. Electronically Signed    By: Monte Fantasia M.D.   On: 05/11/2020 09:10   US BIOPSY (LIVER)  Result Date: 05/17/2020 INDICATION: 60 year old with a large right renal mass and multiple liver and pulmonary lesions. Findings are compatible with metastatic disease and patient needs a tissue diagnosis. EXAM: ULTRASOUND-GUIDED LIVER LESION BIOPSY MEDICATIONS: None. ANESTHESIA/SEDATION: Moderate (conscious) sedation was employed during this procedure. A total of Versed 2.0 mg and Fentanyl 100 mcg was administered intravenously. Moderate Sedation Time: 14 minutes. The patient's level of consciousness and vital signs were monitored continuously by radiology nursing throughout the procedure under my direct supervision. FLUOROSCOPY TIME:  None COMPLICATIONS: None immediate. PROCEDURE: Informed written consent was obtained from the patient after a thorough discussion of the procedural risks, benefits and alternatives. All questions were addressed. Maximal Sterile Barrier Technique was utilized including caps, mask, sterile gowns, sterile gloves, sterile drape, hand hygiene and skin antiseptic. A timeout was performed prior to the initiation of the procedure. Liver was evaluated with ultrasound. Lesion in the inferior right hepatic lobe was identified and targeted for biopsy. Abdomen was prepped with chlorhexidine and sterile field was created. Skin and soft tissues were anesthetized using 1% lidocaine. Small incision was made. Using ultrasound guidance, 17 gauge coaxial needle was directed into the right hepatic lobe and lesion. Core biopsies were obtained with an 18 gauge core device. Specimens placed in formalin. Gel-Foam slurry was injected through the 17 gauge needle as it was removed. No immediate bleeding. Bandage placed over the puncture site. FINDINGS: Several heterogeneous lesions in the liver compatible with known findings. Biopsy needle was directed into an inferior right hepatic lesion. No immediate bleeding or hematoma formation.  IMPRESSION: Ultrasound-guided core biopsy of a right hepatic lesion. Electronically Signed   By: Markus Daft M.D.   On: 05/17/2020 17:12   DG Chest Port 1 View  Result Date: 06/07/2020 CLINICAL DATA:  Lung carcinoma with fever EXAM: PORTABLE CHEST 1 VIEW COMPARISON:  Chest CT May 04, 2020 FINDINGS: Multiple nodular opacities again noted throughout the lungs bilaterally. Largest nodular opacities in left lower lobe with largest nodular opacities measuring 4.1 x 2.8 cm and 4.3 x 3.2 cm. No edema or airspace opacity. Heart  size and pulmonary vascular normal. No adenopathy evident by radiography. No bone lesions. 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 No edema or consolidation. Heart size normal. Electronically Signed   By: Lowella Grip III M.D.   On: 06/07/2020 12:18    MR ANGIO HEAD WO CONTRAST  Result Date: 05/23/2020 [facility] NEURO[facility]AG[facility] REPORT STUDY DATE: 05/22/2020 PATIENT NAME: REMIE MATHISON DOB: [birth year] MRN: [id number] ORDERING CLINICIAN: Kathrynn Ducking, MD CLINICAL HISTORY: 60 year old female with stroke. EXAM: MR ANGIO HEAD WO CONTRAST TECHNIQUE: MR angiogram of the head was obtained utilizing 3D time of flight sequences from below the vertebrobasilar junction up to the intracranial vasculature without contrast.  Computerized reconstructions were obtained. CONTRAST: none COMPARISON: none IMAGING SITE: [facility] [address] (1.5 Tesla MRI) FINDINGS: This study is of adequate technical quality. Decreased flow signal within the bilateral left worse than right paraclinoid internal carotid arteries with normal appearing proximal and terminal ICA flow signals.  This may represent atherosclerosis and stenosis versus artifactual changes.  Flow signal of the bilateral internal carotid arteries have no stenosis. The bilateral middle and anterior cerebral arteries have no stenosis. The bilateral  vertebral, basilar, bilateral posterior cerebral arteries have no stenosis. No aneurysmal dilatations are seen.   MRA head (without) demonstrating: - Decreased flow signal within the bilateral lparaclinoid internal carotid arteries (left worse than right) with normal appearing proximal and terminal ICA flow signals.  This may represent atherosclerosis and stenosis versus artifactual changes. INTERPRETING PHYSICIAN: Penni Bombard, MD Certified in Neurology, Neurophysiology and Neuroimaging [facility] [address], [place], [place] [address] [phone]   MR ANGIO NECK W WO CONTRAST  Result Date: 05/23/2020 [facility] NEURO[facility] REPORT STUDY DATE: 05/22/2020 PATIENT NAME: MARLINE MORACE DOB: April 19, 1960 MRN: [id number] ORDERING CLINICIAN: Kathrynn Ducking, MD CLINICAL HISTORY: 60 year old female with stroke. EXAM: MR ANGIO NECK W WO CONTRAST TECHNIQUE: MR angiogram of the neck was obtained utilizing 3D time-of-flight sequences from the aortic arch up to the intracranial vasculature postbolus contrast infusion.  2D time-of-flight noncontrast views and computerized reconstructions were obtained. CONTRAST: 20 mL MultiHance COMPARISON: none IMAGING SITE: [facility] [address] (1.5 Tesla MRI) FINDINGS: This study is of adequate technical quality.  There is anterograde flow in the bilateral vertebral and carotid arteries on 2D-TOF views.  The flow signal of the left subclavian artery has no stenosis.  The left common, internal and external carotid arteries have no stenosis.  The left vertebral artery has no stenosis from its origin up to the vertebrobasilar junction.  On the right brachiocephalic trunk and subclavian arteries have no stenosis. The right common, internal and external carotid arteries have no stenosis. The right vertebral artery has no stenosis from its origin to the vertebrobasilar junction. Limited views of the intracranial  vasculature are notable for slightly decreased flow signal within the left paraclinoid internal carotid artery as noted on MRA head from same day.   MRA neck with and without contrast demonstrating: -Extracranial carotid arteries have no stenosis.  Vertebral arteries have no stenosis. -Slightly decreased flow signal within left paraclinoid internal carotid artery intracranially as noted on MRA head from same day. INTERPRETING PHYSICIAN: Penni Bombard, MD Certified in Neurology, Neurophysiology and Neuroimaging [facility] [address] [place], [place] [address] [phone]   MR Brain W Wo Contrast  Result Date: 05/11/2020 CLINICAL DATA:  Renal cancer staging. EXAM: MRI HEAD WITHOUT AND  WITH CONTRAST TECHNIQUE: Multiplanar, multiecho pulse sequences of the brain and surrounding structures were obtained without and with intravenous contrast. CONTRAST:  45m GADAVIST GADOBUTROL 1 MMOL/ML IV SOLN COMPARISON:  07/01/2004 FINDINGS: Brain: Patchy weakly restricted diffusion in the left thalamus and along the left frontal parietal cortex. Rounded area of similar signal is seen in the right centrum semiovale and subcortical right frontal white matter. Patchy enhancement is seen in the medial left thalamus. Subcentimeter nodular focus of enhancement at left occipital cortex on 18:54. Nodular focus of enhancement at the high left frontal convexity measuring up to 5 mm, which could be dural or cortical, new from prior. Curvilinear enhancement along the high and anterior right a parietal cortex marked on series 18. 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 Even smaller arachnoid cyst in the high anterior left frontal region. Vascular: Normal flow voids and vascular enhancements. Skull and upper cervical spine: No noted metastatic disease. Sinuses/Orbits: Negative Other: These results will be called to the ordering  clinician or representative by the Radiologist Assistant, and communication documented in the PACS or [employer] IMPRESSION: 1. Findings of subacute infarction in the left PCA and right ACA/MCA territories. 2. Areas of left thalamic and bilateral cortical enhancement are likely related to subacute infarction. 3. Indeterminate nodular focus of enhancement arising from cortex or dura along the high left frontal convexity at measuring 5 mm. 4. Recommend brain MRI with contrast in 6-12 weeks to allow for resolution of infarct related enhancement. Electronically Signed   By: JMonte FantasiaM.D.   On: 05/11/2020 09:10   [place] (LIVER[place]  Result Date: 05/17/2020 INDICATION: 60year old with a large right renal mass and multiple liver and pulmonary lesions. Findings are compatible with metastatic disease and patient needs a tissue diagnosis. EXAM: ULTRASOUND-GUIDED LIVER LESION BIOPSY MEDICATIONS: None. ANESTHESIA/SEDATION: Moderate (conscious) sedation was employed during this procedure. A total of Versed 2.0 mg and Fentanyl 100 mcg was administered intravenously. Moderate Sedation Time: 14 minutes. The patient's level of consciousness and vital signs were monitored continuously by radiology nursing throughout the procedure under my direct supervision. FLUOROSCOPY TIME:  None COMPLICATIONS: None immediate. PROCEDURE: Informed written consent was obtained from the patient after a thorough discussion of the procedural risks, benefits and alternatives. All questions were addressed. Maximal Sterile Barrier Technique was utilized including caps, mask, sterile gowns, sterile gloves, sterile drape, hand hygiene and skin antiseptic. A timeout was performed prior to the initiation of the procedure. Liver was evaluated with ultrasound. Lesion in the inferior right hepatic lobe was identified and targeted for biopsy. Abdomen was prepped with chlorhexidine and sterile field was created. Skin and soft tissues were  anesthetized using 1% lidocaine. Small incision was made. Using ultrasound guidance, 17 gauge coaxial needle was directed into the right hepatic lobe and lesion. Core biopsies were obtained with an 18 gauge core device. Specimens placed in formalin. Gel-Foam slurry was injected through the 17 gauge needle as it was removed. No immediate bleeding. Bandage placed over the puncture site. FINDINGS: Several heterogeneous lesions in the liver compatible with known findings. Biopsy needle was directed into an inferior right hepatic lesion. No immediate bleeding or hematoma formation. IMPRESSION: Ultrasound-guided core biopsy of a right hepatic lesion. Electronically Signed   By: AMarkus DaftM.D.   On: 05/17/2020 17:12   DG Chest Port 1 View  Result Date: 06/07/2020 CLINICAL DATA:  Lung carcinoma with fever EXAM: PORTABLE CHEST  1 VIEW COMPARISON:  Chest CT May 04, 2020 FINDINGS: Multiple nodular opacities again noted throughout the lungs bilaterally. Largest nodular opacities in left lower lobe with largest nodular opacities measuring 4.1 x 2.8 cm and 4.3 x 3.2 cm. No edema or airspace opacity. Heart size and pulmonary vascular normal. No adenopathy evident by radiography. No bone lesions. 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 No edema or consolidation. Heart size normal. Electronically Signed   By: Lowella Grip III M.D.   On: 06/07/2020 12:18    Assessment and Plan:   This is a very pleasant patient well-known to [place] with a clear cell metastatic renal cell carcinoma, extensive disease, poor risk per IMDC criteria recently started on axitinib and Keytruda who presents with chief complaint of nausea, fevers and right-sided abdominal pain which has worsened in the past day. Since she had fevers, she met SIRS criteria and she is on IV fluids and antibiotics per sepsis management protocol. Abdominal exam, no guarding, rigidity or rebound tenderness, mild  tenderness on deep palpation in the right upper quadrant, lumbar and iliac region. I agree with management and evaluation for possible infection per the ER team.  Initially plan was to also consider IV pamidronate given her hypercalcemia but her labs from today do not suggest presence of significant hypercalcemia, hence I discontinued it. I agree that it is reasonable to consider abdominal imaging if is no clear etiology for her symptoms. She can hold axitinib if she cannot tolerate oral intake.  We can resume this when she is home. Her right upper quadrant pain and lumbar pain could very well be related to her tumor I cannot quite explain why this pain is worse in the past 1 or 2 days. We will continue to monitor her if she is admitted.

## 2020-06-07 NOTE — ED Provider Notes (Signed)
[facility] DEPT Provider Note   CSN: [id number] Arrival date & time: 06/07/20  1029     History Chief Complaint  Patient presents with  . Fever    Nausea and vomiting with fever for a "few days"    [name] is a 60 y.o. female.  HPI Patient presenting for evaluation of fever.  She was treated with immunotherapy for renal carcinoma on 06/03/2020.  She presents for evaluation of fever which started yesterday, inability to tolerate food or medications.  She does not have cough, shortness of breath, headache, chest pain or back pain.  She complains of right-sided abdominal pain, which is the site of her renal carcinoma.  No known sick exposures.  There are no other known modifying factors.  She is actively following with oncology, and last saw them on 05/28/2020.  Her cancer is metastatic.  She is newly started on treatment.  She has been instructed to watch her blood pressure, but explained that there could be numerous other side effects of this type of treatment.  She is currently being treated for VTE, and recent imaging done shows renal vein thrombosis, and pulmonary arterial thrombus.  This imaging was done on 05/04/2020.    Past Medical History:  Diagnosis Date  . Hypertension   . Stroke ([place])   . Vaginal prolapse     Patient Active Problem List   Diagnosis Date Noted  . Hypercalcemia 06/05/2020  . CVA (cerebral vascular accident) ([place]) 05/28/2020  . Metastatic renal cell carcinoma ([place]) 05/07/2020  . Pulmonary embolism ([place]) 05/07/2020  . Normocytic normochromic anemia 04/23/2020  . Weight loss, abnormal 04/23/2020  . Vaginal prolapse 11/13/2011  . Cystocele 11/13/2011    Past Surgical History:  Procedure Laterality Date  . ABDOMINAL HYSTERECTOMY    . CYSTOCELE REPAIR    . RECTOCELE REPAIR    . TUBAL LIGATION       OB History   No obstetric history on file.     Family History  Problem Relation Age of Onset  . COPD Mother   .  Hypertension Mother   . Leukemia Father        chronic mylemonocytic leukemia  . Colon cancer Paternal Grandfather     Social History   Tobacco Use  . Smoking status: Former Smoker    Quit date: 11/12/1981    Years since quitting: 38.5  . Smokeless tobacco: Never Used  Vaping Use  . Vaping Use: Never used  Substance Use Topics  . Alcohol use: Yes  . Drug use: No    Home Medications Prior to Admission medications   Medication Sig Start Date End Date Taking? Authorizing Provider  apixaban (ELIQUIS) 5 MG TABS tablet Take 1 tablet (5 mg total) by mouth 2 (two) times daily. 05/28/20   Benay Pike, MD  axitinib (INLYTA) 5 MG tablet Take 1 tablet (5 mg total) by mouth 2 (two) times daily. 05/28/20   Benay Pike, MD  fenofibrate (TRICOR) 145 MG tablet Take 1 tablet by mouth daily. 10/19/13   [provider]  lidocaine-prilocaine (EMLA) cream Apply to affected area once 05/28/20   Benay Pike, MD  losartan-hydrochlorothiazide (HYZAAR) 100-25 MG tablet Take 1 tablet by mouth daily.    [provider]  mirtazapine (REMERON) 15 MG tablet Take 1 tablet (15 mg total) by mouth at bedtime. 05/28/20   Benay Pike, MD  Multiple Vitamin (ONE DAILY) tablet Take 1 tablet by mouth daily.    [provider]  ondansetron (ZOFRAN) 8 MG tablet Take 1 tablet (8 mg total) by mouth 2 (two) times daily as needed (Nausea or vomiting). 05/28/20   Benay Pike, MD  pantoprazole (PROTONIX) 40 MG tablet Take 1 tablet by mouth daily. 04/19/20   [provider]  prochlorperazine (COMPAZINE) 10 MG tablet Take 1 tablet (10 mg total) by mouth every 6 (six) hours as needed (Nausea or vomiting). 05/28/20   Benay Pike, MD    Allergies    Amlodipine and Crestor [rosuvastatin]  Review of Systems   Review of Systems  All other systems reviewed and are negative.   Physical Exam Updated Vital Signs BP 132/62   Pulse 88   Temp (!) 97.4 F (36.3 C)   Resp 18   Ht  5\' 4"  (1.626 m)   Wt 56.2 kg   SpO2 97%   BMI 21.28 kg/m   Physical Exam Vitals and nursing note reviewed.  Constitutional:      General: She is not in acute distress.    Appearance: She is well-developed. She is not ill-appearing, toxic-appearing or diaphoretic.  HENT:     Head: Normocephalic and atraumatic.     Right Ear: External ear normal.     Left Ear: External ear normal.     Mouth/Throat:     Mouth: Mucous membranes are dry.     Pharynx: No oropharyngeal exudate or posterior oropharyngeal erythema.  Eyes:     Conjunctiva/sclera: Conjunctivae normal.     Pupils: Pupils are equal, round, and reactive to light.  Neck:     Trachea: Phonation normal.  Cardiovascular:     Rate and Rhythm: Normal rate and regular rhythm.     Heart sounds: Normal heart sounds.  Pulmonary:     Effort: Pulmonary effort is normal.     Breath sounds: Normal breath sounds.  Abdominal:     General: There is no distension.     Palpations: Abdomen is soft.     Tenderness: There is abdominal tenderness (Right upper and mid lateral abdomen, moderate). There is guarding.  Musculoskeletal:        General: Normal range of motion.     Cervical back: Normal range of motion and neck supple.  Skin:    General: Skin is warm and dry.  Neurological:     Mental Status: She is alert and oriented to person, place, and time.     Cranial Nerves: No cranial nerve deficit.     Sensory: No sensory deficit.     Motor: No abnormal muscle tone.     Coordination: Coordination normal.  Psychiatric:        Mood and Affect: Mood normal.        Behavior: Behavior normal.        Thought Content: Thought content normal.        Judgment: Judgment normal.     ED Results / Procedures / Treatments   Labs (all labs ordered are listed, but only abnormal results are displayed) Labs Reviewed  COMPREHENSIVE METABOLIC PANEL - Abnormal; Notable for the following components:      Result Value   Sodium 130 (*)    Glucose,  Bld 161 (*)    Albumin 2.9 (*)    AST 99 (*)    All other components within normal limits  CBC WITH DIFFERENTIAL/PLATELET - Abnormal; Notable for the following components:   WBC 11.8 (*)    RBC 3.76 (*)    Hemoglobin 9.0 (*)    HCT 29.5 (*)  MCV 78.5 (*)    MCH 23.9 (*)    Neutro Abs 9.9 (*)    Monocytes Absolute 1.1 (*)    All other components within normal limits  PROTIME-INR - Abnormal; Notable for the following components:   Prothrombin Time 24.7 (*)    INR 2.2 (*)    All other components within normal limits  APTT - Abnormal; Notable for the following components:   aPTT 43 (*)    All other components within normal limits  URINALYSIS, ROUTINE W REFLEX MICROSCOPIC - Abnormal; Notable for the following components:   Hgb urine dipstick LARGE (*)    Protein, ur 30 (*)    Leukocytes,Ua MODERATE (*)    Bacteria, UA FEW (*)    All other components within normal limits  RESP PANEL BY RT-PCR (FLU A&B, COVID) ARPGX2  CULTURE, BLOOD (ROUTINE X 2)  CULTURE, BLOOD (ROUTINE X 2)  URINE CULTURE  LACTIC ACID, PLASMA  LACTIC ACID, PLASMA    EKG None  Radiology DG Chest Port 1 View  Result Date: 06/07/2020 CLINICAL DATA:  Lung carcinoma with fever EXAM: PORTABLE CHEST 1 VIEW COMPARISON:  Chest CT May 04, 2020 FINDINGS: Multiple nodular opacities again noted throughout the lungs bilaterally. Largest nodular opacities in left lower lobe with largest nodular opacities measuring 4.1 x 2.8 cm and 4.3 x 3.2 cm. No edema or airspace opacity. Heart size and pulmonary vascular normal. No adenopathy evident by radiography. No bone lesions. 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 No edema or consolidation. Heart size normal. Electronically Signed   By: Lowella Grip III M.D.   On: 06/07/2020 12:18    Procedures .Critical Care Performed by: Daleen Bo, MD Authorized by: Daleen Bo, MD   Critical care provider statement:     Critical care time (minutes):  65   Critical care start time:  06/07/2020 11:05 AM   Critical care end time:  06/07/2020 3:07 PM   Critical care time was exclusive of:  Separately billable procedures and treating other patients   Critical care was necessary to treat or prevent imminent or life-threatening deterioration of the following conditions:  Metabolic crisis (Oncologic crisis)   Critical care was time spent personally by me on the following activities:  Blood draw for specimens, development of treatment plan with patient or surrogate, discussions with consultants, evaluation of patient's response to treatment, examination of patient, obtaining history from patient or surrogate, ordering and performing treatments and interventions, ordering and review of laboratory studies, pulse oximetry, re-evaluation of patient's condition, review of old charts and ordering and review of radiographic studies     Medications Ordered in ED Medications  lactated ringers infusion (has no administration in time range)  lactated ringers bolus 1,000 mL (0 mLs Intravenous Stopped 06/07/20 1547)    And  lactated ringers bolus 500 mL (0 mLs Intravenous Stopped 06/07/20 1547)    And  lactated ringers bolus 250 mL (has no administration in time range)  vancomycin (VANCOREADY) IVPB 1250 mg/250 mL (1,250 mg Intravenous New Bag/Given 06/07/20 1601)  metroNIDAZOLE (FLAGYL) IVPB 500 mg (0 mg Intravenous Stopped 06/07/20 1547)  ceFEPIme (MAXIPIME) 2 g in sodium chloride 0.9 % 100 mL IVPB (0 g Intravenous Stopped 06/07/20 1547)  iohexol (OMNIPAQUE) 300 MG/ML solution 100 mL (100 mLs Intravenous Contrast Given 06/07/20 1437)    ED Course  I have reviewed the triage vital signs and the nursing notes.  Pertinent labs & imaging results that were  available during my care of the patient were reviewed by me and considered in my medical decision making (see chart for details).  Clinical Course as of 06/07/20 1613  Thu Jun 07, 2020   1235 Patient's oncologist (Dr. Chryl Heck) came to the ED to evaluate her and discussed the case with me.  She wanted me to order a dose of pamidronate, for hypercalcemia [EW]  1457 She is alert, tachypneic, with normal oxygenation on 3 L nasal cannula oxygen.  She states she feels somewhat better but is short of breath.  Lungs have improved air movement with a few scattered end expiratory wheezes. [EW]  1600 CT abdomen pelvis results discussed with Dr. Jacalyn Lefevre, radiology.  Patient has marked metastatic disease, tumor in both IVC, and aorta, and pulmonary emboli.  Clot burden is probably somewhat worse than baseline.  Patient is currently anticoagulated.  No other acute intra-abdominal processes, to explain fever. [EW]    Clinical Course User Index [EW] Daleen Bo, MD   MDM Rules/Calculators/A&P                           Patient Vitals for the past 24 hrs:  BP Temp Temp src Pulse Resp SpO2 Height Weight  06/07/20 1557 132/62 -- -- 88 18 97 % -- --  06/07/20 1518 (!) 141/57 -- -- 90 12 99 % -- --  06/07/20 1345 (!) 148/132 -- -- 87 18 100 % -- --  06/07/20 1330 (!) 144/93 -- -- 85 18 99 % -- --  06/07/20 1328 -- (!) 97.4 F (36.3 C) -- -- -- -- -- --  06/07/20 1315 (!) 153/66 -- -- 92 20 99 % -- --  06/07/20 1245 140/63 -- -- 89 (!) 24 95 % -- --  06/07/20 1215 (!) 147/74 -- -- 92 (!) 28 97 % -- --  06/07/20 1200 136/70 -- -- 90 19 98 % -- --  06/07/20 1130 (!) 143/62 -- -- 98 19 99 % -- --  06/07/20 1100 -- -- -- -- -- -- 5\' 4"  (1.626 m) 56.2 kg  06/07/20 1047 (!) 127/92 (!) 101.6 F (38.7 C) Rectal (!) 102 17 96 % -- --    4:02 PM Reevaluation with update and discussion. After initial assessment and treatment, an updated evaluation reveals she still appears washed out and weak.  Findings gust with her and all questions were answered. Daleen Bo   Medical Decision Making:  This patient is presenting for evaluation of fever with SIRS positive, which does require a range of treatment  options, and is a complaint that involves a high risk of morbidity and mortality. The differential diagnoses include sepsis, dehydration, viral illness. I decided to review old records, and in summary, female presenting for evaluation of fever started yesterday, and worsening abdominal pain following immunotherapy, 4 days ago..  I obtained additional historical information from son at bedside.  Clinical Laboratory Tests Ordered, included CBC, Metabolic panel, Urinalysis and Lactate trending, blood cultures. Review indicates normal except sodium low, glucose high, white count high, INR elevated urine with leukocytes, RBCs, WBCs and few bacteria, urine culture sent. Radiologic Tests Ordered, included chest x-ray.  I independently Visualized: Radiographic images, which show no acute abnormality, metastatic disease bilateral lung bases   Critical Interventions-clinical evaluation, laboratory testing, sepsis bundle with fluids and empiric antibiotics, observation and reassessment  After These Interventions, the Patient was reevaluated and was found with sepsis parameters, possible urinary tract infection, and normal lactate.  Blood pressure normal.  Patient initially SIRS positive, with concern for possible sepsis.  Possible complication of recent initiation of immunotherapy versus ongoing severe metastatic disease associated with great vessel  clotting, and bilateral pulmonary emboli.    CRITICAL CARE-yes Performed by: Daleen Bo  Nursing Notes Reviewed/ Care Coordinated Applicable Imaging Reviewed Interpretation of Laboratory Data incorporated into ED treatment  Dr. Ronnald Nian to arrange disposition after discussion with oncology.,  Admit versus be discharged.    Final Clinical Impression(s) / ED Diagnoses Final diagnoses:  Fever, unspecified fever cause  Metastatic malignant neoplasm, unspecified site [facility])  VTE (venous thromboembolism)  Other pulmonary embolism without acute cor  pulmonale, unspecified chronicity (HCC)  Urinary tract infection without hematuria, site unspecified    Rx / DC Orders ED Discharge Orders    None       Daleen Bo, MD 06/07/20 1627

## 2020-06-07 NOTE — Sepsis Progress Note (Signed)
Code sepsis protocol being monitored by [facility]

## 2020-06-07 NOTE — ED Notes (Signed)
Spoke with pharmacy. Initial medication ordered was Flagyl for 11:30, administered first. Vancomycin delivered by pharmacy following starting administration of flagyl. Per pharmacy Cefepime to be given next, following administration of Flagyl, befeo administration of Vanc.  Per pharmacy, Vanc compatible with lactated ringers.

## 2020-06-07 NOTE — Telephone Encounter (Signed)
Received call from patient's sister regarding whether or not patient needs to be taken to the emergency room. Sister reported that patient has been running a high fever, coughing, and vomiting for the past day. Advised sister to take patient to [facility] Emergency Department for further evaluation. Sister verbalized an understanding and had no further questions.  Dr. Chryl Heck made aware of the situation.

## 2020-06-07 NOTE — ED Notes (Signed)
Provider messaged regarding pt's VS triggering sepsis. No additional orders at this time. Will obtain sepsis blood work and continue to monitor. Pt attached to cardiac monitor x3. A&O x4.

## 2020-06-07 NOTE — ED Triage Notes (Signed)
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 Per EMS, pt has staged IV lung cancer. Per EMS, pt received immunotherapy tx on 06/03/20 and her next treatment is tomorrow. Pt has had nausea, vomiting and fever for a "few days."

## 2020-06-08 ENCOUNTER — Inpatient Hospital Stay (HOSPITAL_COMMUNITY)
Admission: EM | Admit: 2020-06-08 | Discharge: 2020-06-12 | DRG: 872 | Disposition: A | Discharge status: Home / Self Care | Payer: BC Managed Care – PPO | Attending: Internal Medicine | Admitting: Internal Medicine

## 2020-06-08 ENCOUNTER — Telehealth (HOSPITAL_BASED_OUTPATIENT_CLINIC_OR_DEPARTMENT_OTHER): Payer: Self-pay | Admitting: *Deleted

## 2020-06-08 ENCOUNTER — Telehealth: Payer: Self-pay

## 2020-06-08 ENCOUNTER — Encounter (HOSPITAL_COMMUNITY): Payer: Self-pay

## 2020-06-08 ENCOUNTER — Encounter: Payer: Self-pay | Admitting: Hematology and Oncology

## 2020-06-08 ENCOUNTER — Inpatient Hospital Stay: Payer: BC Managed Care – PPO

## 2020-06-08 ENCOUNTER — Other Ambulatory Visit: Payer: Self-pay | Admitting: Hematology and Oncology

## 2020-06-08 ENCOUNTER — Other Ambulatory Visit: Payer: Self-pay

## 2020-06-08 DIAGNOSIS — Z806 Family history of leukemia: Secondary | ICD-10-CM | POA: Diagnosis not present

## 2020-06-08 DIAGNOSIS — Z20822 Contact with and (suspected) exposure to covid-19: Secondary | ICD-10-CM | POA: Diagnosis present

## 2020-06-08 DIAGNOSIS — Z888 Allergy status to other drugs, medicaments and biological substances status: Secondary | ICD-10-CM

## 2020-06-08 DIAGNOSIS — G893 Neoplasm related pain (acute) (chronic): Secondary | ICD-10-CM | POA: Diagnosis present

## 2020-06-08 DIAGNOSIS — Z86711 Personal history of pulmonary embolism: Secondary | ICD-10-CM

## 2020-06-08 DIAGNOSIS — C649 Malignant neoplasm of unspecified kidney, except renal pelvis: Secondary | ICD-10-CM | POA: Diagnosis present

## 2020-06-08 DIAGNOSIS — Z79899 Other long term (current) drug therapy: Secondary | ICD-10-CM | POA: Diagnosis not present

## 2020-06-08 DIAGNOSIS — E875 Hyperkalemia: Secondary | ICD-10-CM | POA: Diagnosis present

## 2020-06-08 DIAGNOSIS — Z87891 Personal history of nicotine dependence: Secondary | ICD-10-CM

## 2020-06-08 DIAGNOSIS — Z800 Family history of malignant neoplasm of digestive organs: Secondary | ICD-10-CM

## 2020-06-08 DIAGNOSIS — Z515 Encounter for palliative care: Secondary | ICD-10-CM | POA: Diagnosis not present

## 2020-06-08 DIAGNOSIS — R42 Dizziness and giddiness: Secondary | ICD-10-CM | POA: Diagnosis present

## 2020-06-08 DIAGNOSIS — N390 Urinary tract infection, site not specified: Secondary | ICD-10-CM | POA: Diagnosis present

## 2020-06-08 DIAGNOSIS — I472 Ventricular tachycardia: Secondary | ICD-10-CM | POA: Diagnosis present

## 2020-06-08 DIAGNOSIS — R54 Age-related physical debility: Secondary | ICD-10-CM | POA: Diagnosis present

## 2020-06-08 DIAGNOSIS — Z7901 Long term (current) use of anticoagulants: Secondary | ICD-10-CM

## 2020-06-08 DIAGNOSIS — E871 Hypo-osmolality and hyponatremia: Secondary | ICD-10-CM | POA: Diagnosis present

## 2020-06-08 DIAGNOSIS — Z6820 Body mass index (BMI) 20.0-20.9, adult: Secondary | ICD-10-CM

## 2020-06-08 DIAGNOSIS — N8110 Cystocele, unspecified: Secondary | ICD-10-CM | POA: Diagnosis present

## 2020-06-08 DIAGNOSIS — C641 Malignant neoplasm of right kidney, except renal pelvis: Secondary | ICD-10-CM

## 2020-06-08 DIAGNOSIS — C7800 Secondary malignant neoplasm of unspecified lung: Secondary | ICD-10-CM | POA: Diagnosis present

## 2020-06-08 DIAGNOSIS — B9562 Methicillin resistant Staphylococcus aureus infection as the cause of diseases classified elsewhere: Secondary | ICD-10-CM | POA: Diagnosis present

## 2020-06-08 DIAGNOSIS — R7881 Bacteremia: Secondary | ICD-10-CM | POA: Diagnosis present

## 2020-06-08 DIAGNOSIS — Z66 Do not resuscitate: Secondary | ICD-10-CM | POA: Diagnosis present

## 2020-06-08 DIAGNOSIS — E861 Hypovolemia: Secondary | ICD-10-CM | POA: Diagnosis present

## 2020-06-08 DIAGNOSIS — R112 Nausea with vomiting, unspecified: Secondary | ICD-10-CM | POA: Diagnosis present

## 2020-06-08 DIAGNOSIS — E785 Hyperlipidemia, unspecified: Secondary | ICD-10-CM | POA: Diagnosis present

## 2020-06-08 DIAGNOSIS — C787 Secondary malignant neoplasm of liver and intrahepatic bile duct: Secondary | ICD-10-CM | POA: Diagnosis present

## 2020-06-08 DIAGNOSIS — I2699 Other pulmonary embolism without acute cor pulmonale: Secondary | ICD-10-CM | POA: Diagnosis not present

## 2020-06-08 DIAGNOSIS — D649 Anemia, unspecified: Secondary | ICD-10-CM | POA: Diagnosis present

## 2020-06-08 DIAGNOSIS — K5903 Drug induced constipation: Secondary | ICD-10-CM

## 2020-06-08 DIAGNOSIS — Z8673 Personal history of transient ischemic attack (TIA), and cerebral infarction without residual deficits: Secondary | ICD-10-CM

## 2020-06-08 DIAGNOSIS — R188 Other ascites: Secondary | ICD-10-CM | POA: Diagnosis present

## 2020-06-08 DIAGNOSIS — R0989 Other specified symptoms and signs involving the circulatory and respiratory systems: Secondary | ICD-10-CM

## 2020-06-08 DIAGNOSIS — R1031 Right lower quadrant pain: Secondary | ICD-10-CM | POA: Diagnosis present

## 2020-06-08 DIAGNOSIS — E8770 Fluid overload, unspecified: Secondary | ICD-10-CM | POA: Diagnosis not present

## 2020-06-08 DIAGNOSIS — Z825 Family history of asthma and other chronic lower respiratory diseases: Secondary | ICD-10-CM

## 2020-06-08 DIAGNOSIS — Z8249 Family history of ischemic heart disease and other diseases of the circulatory system: Secondary | ICD-10-CM

## 2020-06-08 DIAGNOSIS — L8990 Pressure ulcer of unspecified site, unspecified stage: Secondary | ICD-10-CM | POA: Insufficient documentation

## 2020-06-08 DIAGNOSIS — R011 Cardiac murmur, unspecified: Secondary | ICD-10-CM | POA: Diagnosis not present

## 2020-06-08 DIAGNOSIS — Z7189 Other specified counseling: Secondary | ICD-10-CM | POA: Diagnosis not present

## 2020-06-08 DIAGNOSIS — G43909 Migraine, unspecified, not intractable, without status migrainosus: Secondary | ICD-10-CM | POA: Diagnosis present

## 2020-06-08 DIAGNOSIS — R569 Unspecified convulsions: Secondary | ICD-10-CM | POA: Diagnosis present

## 2020-06-08 DIAGNOSIS — I10 Essential (primary) hypertension: Secondary | ICD-10-CM | POA: Diagnosis present

## 2020-06-08 LAB — BLOOD CULTURE ID PANEL (REFLEXED) - BCID2

## 2020-06-08 LAB — CBC WITH DIFFERENTIAL/PLATELET
Abs Immature Granulocytes: 0.04 10*3/uL (ref 0.00–0.07)
Basophils Absolute: 0.1 10*3/uL (ref 0.0–0.1)
Basophils Relative: 1 %
Eosinophils Absolute: 0.1 10*3/uL (ref 0.0–0.5)
Eosinophils Relative: 1 %
HCT: 32.2 % — ABNORMAL LOW (ref 36.0–46.0)
Hemoglobin: 9.6 g/dL — ABNORMAL LOW (ref 12.0–15.0)
Immature Granulocytes: 0 %
Lymphocytes Relative: 14 %
Lymphs Abs: 1.8 10*3/uL (ref 0.7–4.0)
MCH: 24.2 pg — ABNORMAL LOW (ref 26.0–34.0)
MCHC: 29.8 g/dL — ABNORMAL LOW (ref 30.0–36.0)
MCV: 81.1 fL (ref 80.0–100.0)
Monocytes Absolute: 1.1 10*3/uL — ABNORMAL HIGH (ref 0.1–1.0)
Monocytes Relative: 9 %
Neutro Abs: 9.9 10*3/uL — ABNORMAL HIGH (ref 1.7–7.7)
Neutrophils Relative %: 75 %
Platelets: 298 10*3/uL (ref 150–400)
RBC: 3.97 MIL/uL (ref 3.87–5.11)
RDW: 15.2 % (ref 11.5–15.5)
WBC: 13 10*3/uL — ABNORMAL HIGH (ref 4.0–10.5)
nRBC: 0 % (ref 0.0–0.2)

## 2020-06-08 LAB — I-STAT CHEM 8, ED
BUN: 19 mg/dL (ref 8–23)
Calcium, Ion: 1.16 mmol/L (ref 1.15–1.40)
Chloride: 96 mmol/L — ABNORMAL LOW (ref 98–111)
Creatinine, Ser: 0.9 mg/dL (ref 0.44–1.00)
Glucose, Bld: 108 mg/dL — ABNORMAL HIGH (ref 70–99)
HCT: 30 % — ABNORMAL LOW (ref 36.0–46.0)
Hemoglobin: 10.2 g/dL — ABNORMAL LOW (ref 12.0–15.0)
Potassium: 5.2 mmol/L — ABNORMAL HIGH (ref 3.5–5.1)
Sodium: 129 mmol/L — ABNORMAL LOW (ref 135–145)
TCO2: 27 mmol/L (ref 22–32)

## 2020-06-08 LAB — URINALYSIS, ROUTINE W REFLEX MICROSCOPIC
Bacteria, UA: NONE SEEN
Bilirubin Urine: NEGATIVE
Glucose, UA: NEGATIVE mg/dL
Ketones, ur: NEGATIVE mg/dL
Nitrite: NEGATIVE
Protein, ur: NEGATIVE mg/dL
Specific Gravity, Urine: 1.015 (ref 1.005–1.030)
pH: 5 (ref 5.0–8.0)

## 2020-06-08 LAB — URINE CULTURE: Culture: NO GROWTH

## 2020-06-08 LAB — LACTIC ACID, PLASMA
Lactic Acid, Venous: 2 mmol/L (ref 0.5–1.9)
Lactic Acid, Venous: 2 mmol/L (ref 0.5–1.9)

## 2020-06-08 MED ORDER — MORPHINE SULFATE (PF) 4 MG/ML IV SOLN
4.0000 mg | Freq: Once | INTRAVENOUS | Status: AC
  Administered 2020-06-08: 4 mg via INTRAVENOUS
  Filled 2020-06-08: qty 1

## 2020-06-08 MED ORDER — LORAZEPAM 0.5 MG PO TABS: 0.5000 mg | ORAL_TABLET | Freq: Two times a day (BID) | ORAL | 0 refills | Status: AC | PRN

## 2020-06-08 MED ORDER — VANCOMYCIN HCL IN DEXTROSE 1-5 GM/200ML-% IV SOLN
1000.0000 mg | INTRAVENOUS | Status: DC
  Administered 2020-06-09 – 2020-06-12 (×4): 1000 mg via INTRAVENOUS
  Filled 2020-06-08 (×4): qty 200

## 2020-06-08 MED ORDER — SODIUM CHLORIDE 0.9 % IV SOLN: INTRAVENOUS | Status: AC

## 2020-06-08 MED ORDER — MORPHINE SULFATE (PF) 2 MG/ML IV SOLN
2.0000 mg | INTRAVENOUS | Status: DC | PRN
  Administered 2020-06-08 – 2020-06-12 (×8): 2 mg via INTRAVENOUS
  Filled 2020-06-08 (×8): qty 1

## 2020-06-08 MED ORDER — TRAMADOL HCL 50 MG PO TABS: 50.0000 mg | ORAL_TABLET | Freq: Three times a day (TID) | ORAL | 0 refills | Status: AC | PRN

## 2020-06-08 MED ORDER — VANCOMYCIN HCL IN DEXTROSE 1-5 GM/200ML-% IV SOLN
1000.0000 mg | Freq: Once | INTRAVENOUS | Status: AC
  Administered 2020-06-08: 1000 mg via INTRAVENOUS
  Filled 2020-06-08: qty 200

## 2020-06-08 MED ORDER — SODIUM CHLORIDE 0.9 % IV BOLUS
1000.0000 mL | Freq: Once | INTRAVENOUS | Status: AC
Start: 2020-06-08 — End: 2020-06-08
  Administered 2020-06-08: 1000 mL via INTRAVENOUS

## 2020-06-08 NOTE — ED Triage Notes (Signed)
Patient was seen yesterday for N/V and fever yesterday.  Patient's daughter reports that they were called today and was told the patient had positive blood cultures for staph.

## 2020-06-08 NOTE — H&P (Signed)
History and Physical    TISH BEGIN GYJ:[id number] DOB: [birth year] DOA: 06/08/2020  PCP: Josetta Huddle, MD  Patient coming from: Home, daughter at bedside  I have personally briefly reviewed patient's old medical records in [place]  Chief Complaint: positive blood culture  HPI: [name] is a 60 y.o. female with medical history significant for renal cell carcinoma with metastasis, PE on Eliquis (dx 05/04/20), CVA, hypertension, hyperlipidemia and migraine who presents to the ED for a call back for positive staph culture.  Patient was evaluated in the ED yesterday for fever, nausea and vomiting the day following her first immunotherapy infusion on Monday for renal cell carcinoma.  Fever of up to 103.  She had mild leukocytosis at 11.8 and negative CT abdomen.  She was also evaluated bedside by oncology and thought fever could be viral. She was discharged home with follow-up outpatient.  However both sets of blood cultures grew MRSA and she received call back to return back to ER today.  Other than nausea and vomiting being new.  Patient continues to have her usual right-sided abdominal pain from her cancer.  CT abdomen and pelvis yesterday with no new acute findings.  Also notes a cough for the past several weeks without fever.  No new worsening shortness of breath.  She has metastasis foci in the lung bilaterally.  Denies any dysuria.  She had a UA yesterday with negative urine culture.  She does not have any other ports or IV lines.  ED Course: She was afebrile, mildly hypertensive up to systolic of 263.  Leukocytosis of worsened since yesterday up to 13.  Lactate of 2.  Hemoglobin at baseline.  Sodium of 129.  Potassium of 5.2.  Review of Systems: Constitutional: No Weight Change, No Fever ENT/Mouth: No sore throat, No Rhinorrhea Eyes: No Eye Pain, No Vision Changes Cardiovascular: No Chest Pain, no SOB Respiratory: + Cough, No Sputum, No Wheezing, no Dyspnea   Gastrointestinal: +Nausea, +Vomiting, No Diarrhea, No Constipation, + Pain Genitourinary: no Urinary Incontinence, No Urgency, No Flank Pain Musculoskeletal: No Arthralgias, No Myalgias Skin: No Skin Lesions, No Pruritus, Neuro: no Weakness, No Numbness Psych: No Anxiety/Panic, No Depression, no decrease appetite Heme/Lymph: No Bruising, No Bleeding Past Medical History:  Diagnosis Date  . Hypertension   . Stroke ([place])   . Vaginal prolapse     Past Surgical History:  Procedure Laterality Date  . ABDOMINAL HYSTERECTOMY    . CYSTOCELE REPAIR    . RECTOCELE REPAIR    . TUBAL LIGATION       reports that she quit smoking about 38 years ago. She has never used smokeless tobacco. She reports current alcohol use. She reports that she does not use drugs. Social History  Allergies  Allergen Reactions  . Amlodipine     Other reaction(s): dizziness  . Crestor [Rosuvastatin] Other (See Comments)    Muscle cramps    Family History  Problem Relation Age of Onset  . COPD Mother   . Hypertension Mother   . Leukemia Father        chronic mylemonocytic leukemia  . Colon cancer Paternal Grandfather      Prior to Admission medications   Medication Sig Start Date End Date Taking? Authorizing Provider  acetaminophen (TYLENOL) 500 MG tablet Take 1,000 mg by mouth every 6 (six) hours as needed for moderate pain.   Yes [provider]  apixaban (ELIQUIS) 5 MG TABS tablet Take 1 tablet (5 mg total) by  mouth 2 (two) times daily. 05/28/20  Yes Benay Pike, MD  axitinib (INLYTA) 5 MG tablet Take 1 tablet (5 mg total) by mouth 2 (two) times daily. 05/28/20  Yes Benay Pike, MD  fenofibrate (TRICOR) 145 MG tablet Take 145 mg by mouth daily. 10/19/13  Yes [provider]  losartan-hydrochlorothiazide (HYZAAR) 100-25 MG tablet Take 1 tablet by mouth daily.   Yes [provider]  Multiple Vitamin (ONE DAILY) tablet Take 1 tablet by mouth daily.   Yes [provider]  ondansetron (ZOFRAN) 8 MG tablet Take 1 tablet (8 mg total) by mouth 2 (two) times daily as needed (Nausea or vomiting). Patient taking differently: Take 8 mg by mouth 2 (two) times daily as needed for nausea or vomiting. 05/28/20  Yes Benay Pike, MD  prochlorperazine (COMPAZINE) 10 MG tablet Take 1 tablet (10 mg total) by mouth every 6 (six) hours as needed (Nausea or vomiting). Patient taking differently: Take 10 mg by mouth every 6 (six) hours as needed for nausea or vomiting. 05/28/20  Yes Benay Pike, MD  lidocaine-prilocaine (EMLA) cream Apply to affected area once Patient not taking: No sig reported 05/28/20   Benay Pike, MD  LORazepam (ATIVAN) 0.5 MG tablet Take 1 tablet (0.5 mg total) by mouth 2 (two) times daily as needed for anxiety. 06/08/20   Benay Pike, MD  mirtazapine (REMERON) 15 MG tablet Take 1 tablet (15 mg total) by mouth at bedtime. 05/28/20   Benay Pike, MD  traMADol (ULTRAM) 50 MG tablet Take 1 tablet (50 mg total) by mouth 3 (three) times daily as needed. Patient taking differently: Take 50 mg by mouth 3 (three) times daily as needed for moderate pain. 06/08/20   Benay Pike, MD    Physical Exam: Vitals:   06/08/20 1945 06/08/20 2005 06/08/20 2015 06/08/20 2030  BP:  (!) 172/71 (!) 160/66 (!) 160/62  Pulse: 99 99 (!) 102 (!) 101  Resp:  15  18  Temp:      TempSrc:      SpO2: 97% 100% 100% 100%  Weight:      Height:        Constitutional: NAD, calm, comfortable, ill fatigued appearing female laying flat in bed Vitals:   06/08/20 1945 06/08/20 2005 06/08/20 2015 06/08/20 2030  BP:  (!) 172/71 (!) 160/66 (!) 160/62  Pulse: 99 99 (!) 102 (!) 101  Resp:  15  18  Temp:      TempSrc:      SpO2: 97% 100% 100% 100%  Weight:      Height:       Eyes: PERRL, lids and conjunctivae normal ENMT: Mucous membranes are moist.  Neck: normal, supple Respiratory: bibasilar rhonchi. Normal respiratory effort on room air. No accessory  muscle use.  Cardiovascular: Regular rate and rhythm, no murmurs / rubs / gallops. No extremity edema. 2+ pedal pulses.  Abdomen: Right-sided abdominal tenderness. no masses palpated.  Bowel sounds positive.  Musculoskeletal: no clubbing / cyanosis. No joint deformity upper and lower extremities. Good ROM, no contractures. Normal muscle tone.  Skin: no rashes, lesions, ulcers. No induration Neurologic: CN 2-12 grossly intact. Sensation intact, DTR normal. Strength 5/5 in all 4.  Psychiatric: Normal judgment and insight. Alert and oriented x 3. Normal mood.     Labs on Admission: I have personally reviewed following labs and imaging studies  CBC: Recent Labs  Lab 06/04/20 1303 06/07/20 1110 06/08/20 1609 06/08/20 1634  WBC 6.9 11.8* 13.0*  --  NEUTROABS 4.6 9.9* 9.9*  --   HGB 8.3* 9.0* 9.6* 10.2*  HCT 27.3* 29.5* 32.2* 30.0*  MCV 79.8* 78.5* 81.1  --   PLT 334 278 298  --    Basic Metabolic Panel: Recent Labs  Lab 06/04/20 1303 06/07/20 1110 06/08/20 1634  NA 130* 130* 129*  K 4.2 3.7 5.2*  CL 97* 98 96*  CO2 25 23  --   GLUCOSE 238* 161* 108*  BUN 15 16 19   CREATININE 0.87 0.83 0.90  CALCIUM 11.2* 9.2  --    GFR: Estimated Creatinine Clearance: 56 mL/min (by C-G formula based on SCr of 0.9 mg/dL). Liver Function Tests: Recent Labs  Lab 06/04/20 1303 06/07/20 1110  AST 60* 99*  ALT 19 28  ALKPHOS 97 93  BILITOT 0.3 0.6  PROT 6.9 6.7  ALBUMIN 2.8* 2.9*   No results for input(s): LIPASE, AMYLASE in the last 168 hours. No results for input(s): AMMONIA in the last 168 hours. Coagulation Profile: Recent Labs  Lab 06/07/20 1110  INR 2.2*   Cardiac Enzymes: No results for input(s): CKTOTAL, CKMB, CKMBINDEX, TROPONINI in the last 168 hours. BNP (last 3 results) No results for input(s): PROBNP in the last 8760 hours. HbA1C: No results for input(s): HGBA1C in the last 72 hours. CBG: No results for input(s): GLUCAP in the last 168 hours. Lipid Profile: No  results for input(s): CHOL, HDL, LDLCALC, TRIG, CHOLHDL, LDLDIRECT in the last 72 hours. Thyroid Function Tests: No results for input(s): TSH, T4TOTAL, FREET4, T3FREE, THYROIDAB in the last 72 hours. Anemia Panel: No results for input(s): VITAMINB12, FOLATE, FERRITIN, TIBC, IRON, RETICCTPCT in the last 72 hours. Urine analysis:    Component Value Date/Time   COLORURINE YELLOW 06/07/2020 [place] 06/07/2020 1334   LABSPEC 1.011 06/07/2020 1334   PHURINE 5.0 06/07/2020 1334   GLUCOSEU NEGATIVE 06/07/2020 1334   HGBUR LARGE (A) 06/07/2020 1334   BILIRUBINUR NEGATIVE 06/07/2020 [place] 06/07/2020 1334   PROTEINUR 30 (A) 06/07/2020 1334   NITRITE NEGATIVE 06/07/2020 1334   LEUKOCYTESUR MODERATE (A) 06/07/2020 1334    Radiological Exams on Admission: CT Abdomen Pelvis W Contrast  Addendum Date: 06/07/2020   ADDENDUM REPORT: 06/07/2020 17:27 ADDENDUM: Thrombus in a branch of the SMV in the RIGHT lower quadrant is mildly expansile and may reflect tumor thrombus in this area. There are collateral pathways through the mesentery. The vessel upstream from this is patent. This was likely present previously. Attention on follow-up is suggested. Would also correlate with any worsening RIGHT lower quadrant pain. These results were called by telephone at the time of interpretation on 06/07/2020 at 5:25 pm to provider Dr. Ronnald Nian, Who verbally acknowledged these results. Electronically Signed   By: Zetta Bills M.D.   On: 06/07/2020 17:27   Result Date: 06/07/2020 CLINICAL DATA:  Abdominal abscess, infection suspected. Currently on immunotherapy with nausea vomiting and fever. History of RIGHT renal cell carcinoma. EXAM: CT ABDOMEN AND PELVIS WITH CONTRAST TECHNIQUE: Multidetector CT imaging of the abdomen and pelvis was performed using the standard protocol following bolus administration of intravenous contrast. CONTRAST:  143mL OMNIPAQUE IOHEXOL 300 MG/ML  SOLN COMPARISON:   Prior imaging from May 04, 2020. FINDINGS: Lower chest: Large LEFT lower lobe mass 3.9 x 2.8 cm previously approximately 4.3 x 3.2 cm. Medial lung base on the LEFT 3 cm compared to 3.3 cm mass. Medial lung base on the RIGHT (image 7/4) 2.8 cm RIGHT lower lobe mass previously approximately  2.4 cm greatest axial dimension. Other masses show a similar appearance to the prior study obtain just a short time ago. No effusion. No consolidation. Suspected RIGHT lower lobe pulmonary embolism (image 89/5) potential embolism also on image 95/5 in the LEFT lower lobe Hepatobiliary: Thrombus extending directly into the IVC from a large mass in the RIGHT liver and from the RIGHT renal vein, extending just above the IVC confluence. Grossly similar to the prior study. Large RIGHT hepatic lobe mass with heterogeneous enhancement measuring 12 cm greatest axial dimension extending from the RIGHT renal mass. Grossly similar. Anterior RIGHT hepatic lobe mass (image 10/2 6.5 as compared to 6.3 cm. The show heterogeneous and peripheral enhancement. New lesion along the anterior surface of the medial segment LEFT hepatic lobe (image 28/2) 17 mm. New lesion on image 32/2 in hepatic subsegment V 16 mm. Enlarging lesions in the RIGHT hepatic lobe in hepatic subsegment VI (image 43/2) subcentimeter area on the prior study as large is 2.9 cm on today's study. Lesion along the inferior RIGHT hemi liver 4.3 cm as compared to 3.9 cm (image 51/2) Marked enlargement of a lesion along the inferior LEFT hemi liver medial segment (image 41/25.9 cm as compared to 2.7 cm. Pancreas: Normal, without mass, inflammation or ductal dilatation. Spleen: Normal spleen. Adrenals/Urinary Tract: Adrenal glands with obscured LEFT adrenal secondary to large mass potentially involved. The LEFT adrenal is normal. The LEFT kidney is normal. Urinary bladder with smooth contour. RIGHT kidney with infiltrative mass contiguous with large mass in the RIGHT hemi liver.  Invasion of RIGHT renal vein. Extension into IVC and via RIGHT hepatic vein from the intrahepatic portion of the mass. Stomach/Bowel: Stomach is unremarkable. Small bowel without dilation or adjacent stranding. Proximal colon is collapsed. Appendix not visualized. Stool in the distal colon. No adjacent stranding. No perianal stranding. Appendix appears to turn medially away from soft tissue in the pelvis and is not inflamed, no significant change in the appearance of this area since previous imaging Vascular/Lymphatic: Enlarging intra-aortocaval adenopathy in keeping with enlargement of disease elsewhere. 1.8 cm intra-aortocaval lymph node previously 1.5 cm. Increase in general and soft tissue in the intra-aortocaval groove. (Image 38/2) Scattered smaller lymph nodes throughout the retroperitoneum more conspicuous than on previous imaging. No pelvic adenopathy but with retroperitoneal soft tissue in the RIGHT retroperitoneum measuring 4.0 x 1.2 cm previously 4.4 x 1.5 cm. Aortic atherosclerosis. No aneurysmal dilation. Patent abdominal vessels. Reproductive: Post hysterectomy. Other: Small volume ascites in the pelvis.  No free air. Musculoskeletal: Spinal degenerative changes. No acute or destructive bone finding. IMPRESSION: 1. Enlarging hepatic lesions and new hepatic lesions with enlarging upper abdominal adenopathy as described. 2. Signs of pulmonary emboli, again noted in RIGHT lower lobe and perhaps with small new thrombus in LEFT lower lobe, difficult to assess on venous phase. 3. Signs of tumor and likely bland thrombus extending into IVC and RIGHT hepatic vein as well as into the inferior vena cava just above the RIGHT hemidiaphragm. 4. Mixed response with respect to pulmonary findings. 5. Small volume ascites in the pelvis. 6. Aortic atherosclerosis. These results were called by telephone at the time of interpretation on 06/07/2020 at 3:43 pm to provider [facility] , who verbally acknowledged these  results. Electronically Signed: By: Zetta Bills M.D. On: 06/07/2020 15:43   DG Chest Port 1 View  Result Date: 06/07/2020 CLINICAL DATA:  Lung carcinoma with fever EXAM: PORTABLE CHEST 1 VIEW COMPARISON:  Chest CT May 04, 2020 FINDINGS: Multiple nodular opacities  again noted throughout the lungs bilaterally. Largest nodular opacities in left lower lobe with largest nodular opacities measuring 4.1 x 2.8 cm and 4.3 x 3.2 cm. No edema or airspace opacity. Heart size and pulmonary vascular normal. No adenopathy evident by radiography. No bone lesions. 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 No edema or consolidation. Heart size normal. Electronically Signed   By: Lowella Grip III M.D.   On: 06/07/2020 12:18      Assessment/Plan  Staph bacteremia - Pt with fever, nausea and vomiting following first session of immunotherapy infusion Monday - both sets of culture positive for MRSA althought Staph epidermitis also seen  - unclear source- had had cough for weeks but no fever at that time or increasing shortness of breath to suggest pneumonia- will obtain sputum culture - continue IV Vanc - new blood cultures pending - faint flow murmur on exam- obtain echocardiogram - need ID consult in the morning  Hyponatremia -likely from hypovolemia from GI symptoms - follow with morning labs with continuous IV fluids  Mild hyperkalemia K of 5.2 -follow with repeat labs after fluids  Renal cell carcinoma 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 Rob Hickman note yesterday pt can hold axitinib if unable to tolerate PO  PE on Eliquis -dx on 05/04/20  - continue Eliquis   HTN hold lisinopril-HCTZ due to hyponatremia  DVT prophylaxis:.Eliquis Code Status: Full Family Communication: Plan discussed with patient and daughter at bedside  disposition Plan: Home with at least 2 midnight stays  Consults called:  Admission  status: inpatient Level of care: Telemetry  Status is: Inpatient  Remains inpatient appropriate because:Inpatient level of care appropriate due to severity of illness   Dispo: The patient is from: Home              Anticipated d/c is to: Home              Patient currently is not medically stable to d/c.   Difficult to place patient No         Orene Desanctis DO [facility]   If 7PM-7AM, please contact night-coverage [email or web address]   06/08/2020, 9:08 PM

## 2020-06-08 NOTE — Telephone Encounter (Signed)
Returned patient's call in reference to positive blood culture result. Patient received call from ED hospitalist about results and the hospitalist recommended patient receive additional antibiotics. Dr. Chryl Heck made aware of the situation and advises patient to proceed to nearest emergency room to receive additional antibiotics and further evaluation. Patient made aware of Dr. Rob Hickman recommendations and verbalized an understanding. Patient stated her husband would drive her to the hospital. Patient had no further questions or concerns at conclusion of phone call.

## 2020-06-08 NOTE — Progress Notes (Signed)
Pharmacy Antibiotic Note  [name] is a 60 y.o. female on chemotherapy seen yesterday in ED for fevers after chemo; called back on 06/08/2020 for admission d/t methicillin-resistant Staph Epi growing in blood drawn at initial visit. Pharmacy has been consulted for vancomycin dosing.  Plan:  Vancomycin 1000 mg IV q24 hr (est AUC 485 based on SCr 0.9; Vd 0.72)  Measure vancomycin AUC at steady state as indicated  SCr q48 while on vanc   Height: 5\' 4"  (162.6 cm) Weight: 56.2 kg (124 lb) IBW/kg (Calculated) : 54.7  Temp (24hrs), Avg:97.6 F (36.4 C), Min:97.6 F (36.4 C), Max:97.6 F (36.4 C)  Recent Labs  Lab 06/04/20 1303 06/07/20 1110 06/07/20 1324 06/08/20 1609 06/08/20 1634  WBC 6.9 11.8*  --  13.0*  --   CREATININE 0.87 0.83  --   --  0.90  LATICACIDVEN  --  1.5 1.7 2.0*  --     Estimated Creatinine Clearance: 56 mL/min (by C-G formula based on SCr of 0.9 mg/dL).    Allergies  Allergen Reactions  . Amlodipine     Other reaction(s): dizziness  . Crestor [Rosuvastatin] Other (See Comments)    Muscle cramps    Antimicrobials this admission: 5/6 vancomycin >>   Dose adjustments this admission: n/a  Microbiology results: 5/5 BCx: 3/4 bottles growing MRSE   Thank you for allowing pharmacy to be a part of this patient's care.  Delorus Langwell A 06/08/2020 7:30 PM

## 2020-06-08 NOTE — Telephone Encounter (Signed)
Spoke with patient regarding infusion appointment today. Per Dr. Chryl Heck, patient will not need her aredia infusion today. Patient aware and agreeable to the change in the treatment plan.

## 2020-06-08 NOTE — Progress Notes (Signed)
Tramadol and Ativan prescription sent.

## 2020-06-08 NOTE — Telephone Encounter (Signed)
Called to inform patient of new prescriptions placed by Dr. Chryl Heck to help with pain and anxiety. Patient informed that their pharmacy will call when prescriptions are ready to be picked up. Patient had no further questions or concerns upon conclusion of call.

## 2020-06-08 NOTE — ED Provider Notes (Signed)
[facility] DEPT Provider Note   CSN: [id number] Arrival date & time: 06/08/20  1506     History Chief Complaint  Patient presents with  . Abnormal Lab    [name] is a 60 y.o. female.  HPI 60 year old female presents with bacteremia.  The patient was called due to positive blood cultures.  The patient had a fever starting yesterday up to 103.  She just darted Keytruda for renal cell carcinoma.  She has been having continued and worsening pain in her right side where the cancer is.  No new cough or shortness of breath.  The patient was seen here yesterday, given IV antibiotics, and then discharged after scans and labs.  Her flank/side pain seems to be worsening.  She has not gotten the meds yet as they did not go to the pharmacy.  Past Medical History:  Diagnosis Date  . Hypertension   . Stroke ([place])   . Vaginal prolapse     Patient Active Problem List   Diagnosis Date Noted  . Hypercalcemia 06/05/2020  . CVA (cerebral vascular accident) ([place]) 05/28/2020  . Metastatic renal cell carcinoma ([place]) 05/07/2020  . Pulmonary embolism ([place]) 05/07/2020  . Normocytic normochromic anemia 04/23/2020  . Weight loss, abnormal 04/23/2020  . Vaginal prolapse 11/13/2011  . Cystocele 11/13/2011    Past Surgical History:  Procedure Laterality Date  . ABDOMINAL HYSTERECTOMY    . CYSTOCELE REPAIR    . RECTOCELE REPAIR    . TUBAL LIGATION       OB History   No obstetric history on file.     Family History  Problem Relation Age of Onset  . COPD Mother   . Hypertension Mother   . Leukemia Father        chronic mylemonocytic leukemia  . Colon cancer Paternal Grandfather     Social History   Tobacco Use  . Smoking status: Former Smoker    Quit date: 11/12/1981    Years since quitting: 38.5  . Smokeless tobacco: Never Used  Vaping Use  . Vaping Use: Never used  Substance Use Topics  . Alcohol use: Yes  . Drug use: No    Home  Medications Prior to Admission medications   Medication Sig Start Date End Date Taking? Authorizing Provider  acetaminophen (TYLENOL) 500 MG tablet Take 1,000 mg by mouth every 6 (six) hours as needed for moderate pain.   Yes [provider]  apixaban (ELIQUIS) 5 MG TABS tablet Take 1 tablet (5 mg total) by mouth 2 (two) times daily. 05/28/20  Yes Benay Pike, MD  axitinib (INLYTA) 5 MG tablet Take 1 tablet (5 mg total) by mouth 2 (two) times daily. 05/28/20  Yes Benay Pike, MD  fenofibrate (TRICOR) 145 MG tablet Take 145 mg by mouth daily. 10/19/13  Yes [provider]  losartan-hydrochlorothiazide (HYZAAR) 100-25 MG tablet Take 1 tablet by mouth daily.   Yes [provider]  Multiple Vitamin (ONE DAILY) tablet Take 1 tablet by mouth daily.   Yes [provider]  ondansetron (ZOFRAN) 8 MG tablet Take 1 tablet (8 mg total) by mouth 2 (two) times daily as needed (Nausea or vomiting). Patient taking differently: Take 8 mg by mouth 2 (two) times daily as needed for nausea or vomiting. 05/28/20  Yes Benay Pike, MD  prochlorperazine (COMPAZINE) 10 MG tablet Take 1 tablet (10 mg total) by mouth every 6 (six) hours as needed (Nausea or vomiting). Patient taking differently: Take 10 mg  by mouth every 6 (six) hours as needed for nausea or vomiting. 05/28/20  Yes Iruku, Arletha Pili, MD  lidocaine-prilocaine (EMLA) cream Apply to affected area once Patient not taking: No sig reported 05/28/20   Benay Pike, MD  LORazepam (ATIVAN) 0.5 MG tablet Take 1 tablet (0.5 mg total) by mouth 2 (two) times daily as needed for anxiety. 06/08/20   Benay Pike, MD  mirtazapine (REMERON) 15 MG tablet Take 1 tablet (15 mg total) by mouth at bedtime. 05/28/20   Benay Pike, MD  traMADol (ULTRAM) 50 MG tablet Take 1 tablet (50 mg total) by mouth 3 (three) times daily as needed. Patient taking differently: Take 50 mg by mouth 3 (three) times daily as needed for moderate pain.  06/08/20   Benay Pike, MD    Allergies    Amlodipine and Crestor [rosuvastatin]  Review of Systems   Review of Systems  Constitutional: Positive for fever.  Respiratory: Negative for cough and shortness of breath.   Cardiovascular: Negative for chest pain.  Gastrointestinal: Positive for abdominal pain.  Genitourinary: Positive for flank pain.  All other systems reviewed and are negative.   Physical Exam Updated Vital Signs BP (!) 172/71   Pulse 99   Temp 97.6 F (36.4 C) (Oral)   Resp 15   Ht 5\' 4"  (1.626 m)   Wt 56.2 kg   SpO2 100%   BMI 21.28 kg/m   Physical Exam Vitals and nursing note reviewed.  Constitutional:      Appearance: She is well-developed. She is not diaphoretic.  HENT:     Head: Normocephalic and atraumatic.     Right Ear: External ear normal.     Left Ear: External ear normal.     Nose: Nose normal.  Eyes:     General:        Right eye: No discharge.        Left eye: No discharge.  Cardiovascular:     Rate and Rhythm: Normal rate and regular rhythm.     Heart sounds: Murmur heard.    Pulmonary:     Effort: Pulmonary effort is normal.     Breath sounds: Normal breath sounds.  Abdominal:     Palpations: Abdomen is soft.     Tenderness: There is abdominal tenderness (right sided). There is right CVA tenderness.  Skin:    General: Skin is warm and dry.  Neurological:     Mental Status: She is alert.  Psychiatric:        Mood and Affect: Mood is not anxious.     ED Results / Procedures / Treatments   Labs (all labs ordered are listed, but only abnormal results are displayed) Labs Reviewed  CBC WITH DIFFERENTIAL/PLATELET - Abnormal; Notable for the following components:      Result Value   WBC 13.0 (*)    Hemoglobin 9.6 (*)    HCT 32.2 (*)    MCH 24.2 (*)    MCHC 29.8 (*)    Neutro Abs 9.9 (*)    Monocytes Absolute 1.1 (*)    All other components within normal limits  LACTIC ACID, PLASMA - Abnormal; Notable for the following  components:   Lactic Acid, Venous 2.0 (*)    All other components within normal limits  I-STAT CHEM 8, ED - Abnormal; Notable for the following components:   Sodium 129 (*)    Potassium 5.2 (*)    Chloride 96 (*)    Glucose, Bld 108 (*)  Hemoglobin 10.2 (*)    HCT 30.0 (*)    All other components within normal limits  CULTURE, BLOOD (ROUTINE X 2)  CULTURE, BLOOD (ROUTINE X 2)  LACTIC ACID, PLASMA  URINALYSIS, ROUTINE W REFLEX MICROSCOPIC  COMPREHENSIVE METABOLIC PANEL  CREATININE, SERUM    EKG None  Radiology CT Abdomen Pelvis W Contrast  Addendum Date: 06/07/2020   ADDENDUM REPORT: 06/07/2020 17:27 ADDENDUM: Thrombus in a branch of the SMV in the RIGHT lower quadrant is mildly expansile and may reflect tumor thrombus in this area. There are collateral pathways through the mesentery. The vessel upstream from this is patent. This was likely present previously. Attention on follow-up is suggested. Would also correlate with any worsening RIGHT lower quadrant pain. These results were called by telephone at the time of interpretation on 06/07/2020 at 5:25 pm to provider Dr. Ronnald Nian, Who verbally acknowledged these results. Electronically Signed   By: Zetta Bills M.D.   On: 06/07/2020 17:27   Result Date: 06/07/2020 CLINICAL DATA:  Abdominal abscess, infection suspected. Currently on immunotherapy with nausea vomiting and fever. History of RIGHT renal cell carcinoma. EXAM: CT ABDOMEN AND PELVIS WITH CONTRAST TECHNIQUE: Multidetector CT imaging of the abdomen and pelvis was performed using the standard protocol following bolus administration of intravenous contrast. CONTRAST:  173mL OMNIPAQUE IOHEXOL 300 MG/ML  SOLN COMPARISON:  Prior imaging from May 04, 2020. FINDINGS: Lower chest: Large LEFT lower lobe mass 3.9 x 2.8 cm previously approximately 4.3 x 3.2 cm. Medial lung base on the LEFT 3 cm compared to 3.3 cm mass. Medial lung base on the RIGHT (image 7/4) 2.8 cm RIGHT lower lobe mass  previously approximately 2.4 cm greatest axial dimension. Other masses show a similar appearance to the prior study obtain just a short time ago. No effusion. No consolidation. Suspected RIGHT lower lobe pulmonary embolism (image 89/5) potential embolism also on image 95/5 in the LEFT lower lobe Hepatobiliary: Thrombus extending directly into the IVC from a large mass in the RIGHT liver and from the RIGHT renal vein, extending just above the IVC confluence. Grossly similar to the prior study. Large RIGHT hepatic lobe mass with heterogeneous enhancement measuring 12 cm greatest axial dimension extending from the RIGHT renal mass. Grossly similar. Anterior RIGHT hepatic lobe mass (image 10/2 6.5 as compared to 6.3 cm. The show heterogeneous and peripheral enhancement. New lesion along the anterior surface of the medial segment LEFT hepatic lobe (image 28/2) 17 mm. New lesion on image 32/2 in hepatic subsegment V 16 mm. Enlarging lesions in the RIGHT hepatic lobe in hepatic subsegment VI (image 43/2) subcentimeter area on the prior study as large is 2.9 cm on today's study. Lesion along the inferior RIGHT hemi liver 4.3 cm as compared to 3.9 cm (image 51/2) Marked enlargement of a lesion along the inferior LEFT hemi liver medial segment (image 41/25.9 cm as compared to 2.7 cm. Pancreas: Normal, without mass, inflammation or ductal dilatation. Spleen: Normal spleen. Adrenals/Urinary Tract: Adrenal glands with obscured LEFT adrenal secondary to large mass potentially involved. The LEFT adrenal is normal. The LEFT kidney is normal. Urinary bladder with smooth contour. RIGHT kidney with infiltrative mass contiguous with large mass in the RIGHT hemi liver. Invasion of RIGHT renal vein. Extension into IVC and via RIGHT hepatic vein from the intrahepatic portion of the mass. Stomach/Bowel: Stomach is unremarkable. Small bowel without dilation or adjacent stranding. Proximal colon is collapsed. Appendix not visualized. Stool  in the distal colon. No adjacent stranding. No perianal stranding. Appendix  appears to turn medially away from soft tissue in the pelvis and is not inflamed, no significant change in the appearance of this area since previous imaging Vascular/Lymphatic: Enlarging intra-aortocaval adenopathy in keeping with enlargement of disease elsewhere. 1.8 cm intra-aortocaval lymph node previously 1.5 cm. Increase in general and soft tissue in the intra-aortocaval groove. (Image 38/2) Scattered smaller lymph nodes throughout the retroperitoneum more conspicuous than on previous imaging. No pelvic adenopathy but with retroperitoneal soft tissue in the RIGHT retroperitoneum measuring 4.0 x 1.2 cm previously 4.4 x 1.5 cm. Aortic atherosclerosis. No aneurysmal dilation. Patent abdominal vessels. Reproductive: Post hysterectomy. Other: Small volume ascites in the pelvis.  No free air. Musculoskeletal: Spinal degenerative changes. No acute or destructive bone finding. IMPRESSION: 1. Enlarging hepatic lesions and new hepatic lesions with enlarging upper abdominal adenopathy as described. 2. Signs of pulmonary emboli, again noted in RIGHT lower lobe and perhaps with small new thrombus in LEFT lower lobe, difficult to assess on venous phase. 3. Signs of tumor and likely bland thrombus extending into IVC and RIGHT hepatic vein as well as into the inferior vena cava just above the RIGHT hemidiaphragm. 4. Mixed response with respect to pulmonary findings. 5. Small volume ascites in the pelvis. 6. Aortic atherosclerosis. These results were called by telephone at the time of interpretation on 06/07/2020 at 3:43 pm to provider [facility] , who verbally acknowledged these results. Electronically Signed: By: Zetta Bills M.D. On: 06/07/2020 15:43   DG Chest Port 1 View  Result Date: 06/07/2020 CLINICAL DATA:  Lung carcinoma with fever EXAM: PORTABLE CHEST 1 VIEW COMPARISON:  Chest CT May 04, 2020 FINDINGS: Multiple nodular opacities again  noted throughout the lungs bilaterally. Largest nodular opacities in left lower lobe with largest nodular opacities measuring 4.1 x 2.8 cm and 4.3 x 3.2 cm. No edema or airspace opacity. Heart size and pulmonary vascular normal. No adenopathy evident by radiography. No bone lesions. 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 No edema or consolidation. Heart size normal. Electronically Signed   By: Lowella Grip III M.D.   On: 06/07/2020 12:18    Procedures Procedures   Medications Ordered in ED Medications  vancomycin (VANCOCIN) IVPB 1000 mg/200 mL premix (has no administration in time range)  sodium chloride 0.9 % bolus 1,000 mL (0 mLs Intravenous Stopped 06/08/20 1837)  morphine 4 MG/ML injection 4 mg (4 mg Intravenous Given 06/08/20 1727)  vancomycin (VANCOCIN) IVPB 1000 mg/200 mL premix (0 mg Intravenous Stopped 06/08/20 2007)    ED Course  I have reviewed the triage vital signs and the nursing notes.  Pertinent labs & imaging results that were available during my care of the patient were reviewed by me and considered in my medical decision making (see chart for details).    MDM Rules/Calculators/A&P                          Chart review shows she had 3/4 bottles grow gram positive cocci. She also has a subtle murmur, states no prior history of murmur. Will give vanc. Given IV pain control as well.  Given the bacteremia, will admit.  Unclear exact cause as I do not think her CT showed any obvious cause and she does not have an indwelling line. Dr. Flossie Buffy to admit. Final Clinical Impression(s) / ED Diagnoses Final diagnoses:  Bacteremia    Rx / DC Orders ED Discharge Orders    None  Sherwood Gambler, MD 06/08/20 2021

## 2020-06-08 NOTE — H&P (Incomplete)
History and Physical    [name] TOI:[id number] DOB: April 15, 1960 DOA: 06/08/2020  PCP: Josetta Huddle, MD  Patient coming from: Home, daughter at bedside  I have personally briefly reviewed patient's old medical records in [place]  Chief Complaint: positive blood culture  HPI: [name] is a 60 y.o. female with medical history significant for renal cell carcinoma with metastasis, PE on Eliquis (dx 05/04/20), CVA, hypertension, hyperlipidemia and migraine who presents to the ED for a call back for positive staph culture.  Patient was evaluated in the ED yesterday for fever, nausea and vomiting the day following her first immunotherapy infusion on Monday for renal cell carcinoma.  Fever of up to 103.  She had mild leukocytosis at 11.8 and negative CT abdomen.  She was also evaluated bedside by oncology and thought fever could be viral. She was discharged home with follow-up outpatient.  However both sets of blood cultures grew MRSA and she received call back to return back to ER today.  Other than nausea and vomiting being new.  Patient continues to have her usual right-sided abdominal pain from her cancer.  CT abdomen and pelvis yesterday with no new acute findings.  Also notes a cough for the past several weeks without fever.  No new worsening shortness of breath.  She has metastasis foci in the lung bilaterally.  Denies any dysuria.  She had a UA yesterday with negative urine culture.  She does not have any other ports or IV lines.  ED Course: She was afebrile, mildly hypertensive up to systolic of 833.  Leukocytosis of worsened since yesterday up to 13.  Lactate of 2.  Hemoglobin at baseline.  Sodium of 129.  Potassium of 5.2.  Review of Systems: Constitutional: No Weight Change, No Fever ENT/Mouth: No sore throat, No Rhinorrhea Eyes: No Eye Pain, No Vision Changes Cardiovascular: No Chest Pain, no SOB Respiratory: + Cough, No Sputum, No Wheezing, no Dyspnea   Gastrointestinal: +Nausea, +Vomiting, No Diarrhea, No Constipation, + Pain Genitourinary: no Urinary Incontinence, No Urgency, No Flank Pain Musculoskeletal: No Arthralgias, No Myalgias Skin: No Skin Lesions, No Pruritus, Neuro: no Weakness, No Numbness Psych: No Anxiety/Panic, No Depression, no decrease appetite Heme/Lymph: No Bruising, No Bleeding Past Medical History:  Diagnosis Date  . Hypertension   . Stroke ([place])   . Vaginal prolapse     Past Surgical History:  Procedure Laterality Date  . ABDOMINAL HYSTERECTOMY    . CYSTOCELE REPAIR    . RECTOCELE REPAIR    . TUBAL LIGATION       reports that she quit smoking about 38 years ago. She has never used smokeless tobacco. She reports current alcohol use. She reports that she does not use drugs. Social History  Allergies  Allergen Reactions  . Amlodipine     Other reaction(s): dizziness  . Crestor [Rosuvastatin] Other (See Comments)    Muscle cramps    Family History  Problem Relation Age of Onset  . COPD Mother   . Hypertension Mother   . Leukemia Father        chronic mylemonocytic leukemia  . Colon cancer Paternal Grandfather      Prior to Admission medications   Medication Sig Start Date End Date Taking? Authorizing Provider  acetaminophen (TYLENOL) 500 MG tablet Take 1,000 mg by mouth every 6 (six) hours as needed for moderate pain.   Yes [provider]  apixaban (ELIQUIS) 5 MG TABS tablet Take 1 tablet (5 mg total) by  mouth 2 (two) times daily. 05/28/20  Yes Benay Pike, MD  axitinib (INLYTA) 5 MG tablet Take 1 tablet (5 mg total) by mouth 2 (two) times daily. 05/28/20  Yes Benay Pike, MD  fenofibrate (TRICOR) 145 MG tablet Take 145 mg by mouth daily. 10/19/13  Yes [provider]  losartan-hydrochlorothiazide (HYZAAR) 100-25 MG tablet Take 1 tablet by mouth daily.   Yes [provider]  Multiple Vitamin (ONE DAILY) tablet Take 1 tablet by mouth daily.   Yes [provider]  ondansetron (ZOFRAN) 8 MG tablet Take 1 tablet (8 mg total) by mouth 2 (two) times daily as needed (Nausea or vomiting). Patient taking differently: Take 8 mg by mouth 2 (two) times daily as needed for nausea or vomiting. 05/28/20  Yes Benay Pike, MD  prochlorperazine (COMPAZINE) 10 MG tablet Take 1 tablet (10 mg total) by mouth every 6 (six) hours as needed (Nausea or vomiting). Patient taking differently: Take 10 mg by mouth every 6 (six) hours as needed for nausea or vomiting. 05/28/20  Yes Benay Pike, MD  lidocaine-prilocaine (EMLA) cream Apply to affected area once Patient not taking: No sig reported 05/28/20   Benay Pike, MD  LORazepam (ATIVAN) 0.5 MG tablet Take 1 tablet (0.5 mg total) by mouth 2 (two) times daily as needed for anxiety. 06/08/20   Benay Pike, MD  mirtazapine (REMERON) 15 MG tablet Take 1 tablet (15 mg total) by mouth at bedtime. 05/28/20   Benay Pike, MD  traMADol (ULTRAM) 50 MG tablet Take 1 tablet (50 mg total) by mouth 3 (three) times daily as needed. Patient taking differently: Take 50 mg by mouth 3 (three) times daily as needed for moderate pain. 06/08/20   Benay Pike, MD    Physical Exam: Vitals:   06/08/20 1945 06/08/20 2005 06/08/20 2015 06/08/20 2030  BP:  (!) 172/71 (!) 160/66 (!) 160/62  Pulse: 99 99 (!) 102 (!) 101  Resp:  15  18  Temp:      TempSrc:      SpO2: 97% 100% 100% 100%  Weight:      Height:        Constitutional: NAD, calm, comfortable Vitals:   06/08/20 1945 06/08/20 2005 06/08/20 2015 06/08/20 2030  BP:  (!) 172/71 (!) 160/66 (!) 160/62  Pulse: 99 99 (!) 102 (!) 101  Resp:  15  18  Temp:      TempSrc:      SpO2: 97% 100% 100% 100%  Weight:      Height:       Eyes: PERRL, lids and conjunctivae normal ENMT: Mucous membranes are moist. Posterior pharynx clear of any exudate or lesions.Normal dentition.  Neck: normal, supple, no masses, no thyromegaly Respiratory: clear to auscultation  bilaterally, no wheezing, no crackles. Normal respiratory effort. No accessory muscle use.  Cardiovascular: Regular rate and rhythm, no murmurs / rubs / gallops. No extremity edema. 2+ pedal pulses. No carotid bruits.  Abdomen: no tenderness, no masses palpated. No hepatosplenomegaly. Bowel sounds positive.  Musculoskeletal: no clubbing / cyanosis. No joint deformity upper and lower extremities. Good ROM, no contractures. Normal muscle tone.  Skin: no rashes, lesions, ulcers. No induration Neurologic: CN 2-12 grossly intact. Sensation intact, DTR normal. Strength 5/5 in all 4.  Psychiatric: Normal judgment and insight. Alert and oriented x 3. Normal mood.   (Anything < 9 systems with 2 bullets each down codes to level 1) (If patient refuses exam can't bill higher level) (Make sure to document  decubitus ulcers present on admission -- if possible -- and whether patient has chronic indwelling catheter at time of admission)  Labs on Admission: I have personally reviewed following labs and imaging studies  CBC: Recent Labs  Lab 06/04/20 1303 06/07/20 1110 06/08/20 1609 06/08/20 1634  WBC 6.9 11.8* 13.0*  --   NEUTROABS 4.6 9.9* 9.9*  --   HGB 8.3* 9.0* 9.6* 10.2*  HCT 27.3* 29.5* 32.2* 30.0*  MCV 79.8* 78.5* 81.1  --   PLT 334 278 298  --    Basic Metabolic Panel: Recent Labs  Lab 06/04/20 1303 06/07/20 1110 06/08/20 1634  NA 130* 130* 129*  K 4.2 3.7 5.2*  CL 97* 98 96*  CO2 25 23  --   GLUCOSE 238* 161* 108*  BUN 15 16 19   CREATININE 0.87 0.83 0.90  CALCIUM 11.2* 9.2  --    GFR: Estimated Creatinine Clearance: 56 mL/min (by C-G formula based on SCr of 0.9 mg/dL). Liver Function Tests: Recent Labs  Lab 06/04/20 1303 06/07/20 1110  AST 60* 99*  ALT 19 28  ALKPHOS 97 93  BILITOT 0.3 0.6  PROT 6.9 6.7  ALBUMIN 2.8* 2.9*   No results for input(s): LIPASE, AMYLASE in the last 168 hours. No results for input(s): AMMONIA in the last 168 hours. Coagulation  Profile: Recent Labs  Lab 06/07/20 1110  INR 2.2*   Cardiac Enzymes: No results for input(s): CKTOTAL, CKMB, CKMBINDEX, TROPONINI in the last 168 hours. BNP (last 3 results) No results for input(s): PROBNP in the last 8760 hours. HbA1C: No results for input(s): HGBA1C in the last 72 hours. CBG: No results for input(s): GLUCAP in the last 168 hours. Lipid Profile: No results for input(s): CHOL, HDL, LDLCALC, TRIG, CHOLHDL, LDLDIRECT in the last 72 hours. Thyroid Function Tests: No results for input(s): TSH, T4TOTAL, FREET4, T3FREE, THYROIDAB in the last 72 hours. Anemia Panel: No results for input(s): VITAMINB12, FOLATE, FERRITIN, TIBC, IRON, RETICCTPCT in the last 72 hours. Urine analysis:    Component Value Date/Time   COLORURINE YELLOW 06/07/2020 [place] 06/07/2020 1334   LABSPEC 1.011 06/07/2020 1334   PHURINE 5.0 06/07/2020 1334   GLUCOSEU NEGATIVE 06/07/2020 1334   HGBUR LARGE (A) 06/07/2020 1334   BILIRUBINUR NEGATIVE 06/07/2020 [place] 06/07/2020 1334   PROTEINUR 30 (A) 06/07/2020 1334   NITRITE NEGATIVE 06/07/2020 1334   LEUKOCYTESUR MODERATE (A) 06/07/2020 1334    Radiological Exams on Admission: CT Abdomen Pelvis W Contrast  Addendum Date: 06/07/2020   ADDENDUM REPORT: 06/07/2020 17:27 ADDENDUM: Thrombus in a branch of the SMV in the RIGHT lower quadrant is mildly expansile and may reflect tumor thrombus in this area. There are collateral pathways through the mesentery. The vessel upstream from this is patent. This was likely present previously. Attention on follow-up is suggested. Would also correlate with any worsening RIGHT lower quadrant pain. These results were called by telephone at the time of interpretation on 06/07/2020 at 5:25 pm to provider Dr. Ronnald Nian, Who verbally acknowledged these results. Electronically Signed   By: Zetta Bills M.D.   On: 06/07/2020 17:27   Result Date: 06/07/2020 CLINICAL DATA:  Abdominal abscess,  infection suspected. Currently on immunotherapy with nausea vomiting and fever. History of RIGHT renal cell carcinoma. EXAM: CT ABDOMEN AND PELVIS WITH CONTRAST TECHNIQUE: Multidetector CT imaging of the abdomen and pelvis was performed using the standard protocol following bolus administration of intravenous contrast. CONTRAST:  136mL OMNIPAQUE IOHEXOL 300 MG/ML  SOLN COMPARISON:  Prior imaging from May 04, 2020. FINDINGS: Lower chest: Large LEFT lower lobe mass 3.9 x 2.8 cm previously approximately 4.3 x 3.2 cm. Medial lung base on the LEFT 3 cm compared to 3.3 cm mass. Medial lung base on the RIGHT (image 7/4) 2.8 cm RIGHT lower lobe mass previously approximately 2.4 cm greatest axial dimension. Other masses show a similar appearance to the prior study obtain just a short time ago. No effusion. No consolidation. Suspected RIGHT lower lobe pulmonary embolism (image 89/5) potential embolism also on image 95/5 in the LEFT lower lobe Hepatobiliary: Thrombus extending directly into the IVC from a large mass in the RIGHT liver and from the RIGHT renal vein, extending just above the IVC confluence. Grossly similar to the prior study. Large RIGHT hepatic lobe mass with heterogeneous enhancement measuring 12 cm greatest axial dimension extending from the RIGHT renal mass. Grossly similar. Anterior RIGHT hepatic lobe mass (image 10/2 6.5 as compared to 6.3 cm. The show heterogeneous and peripheral enhancement. New lesion along the anterior surface of the medial segment LEFT hepatic lobe (image 28/2) 17 mm. New lesion on image 32/2 in hepatic subsegment V 16 mm. Enlarging lesions in the RIGHT hepatic lobe in hepatic subsegment VI (image 43/2) subcentimeter area on the prior study as large is 2.9 cm on today's study. Lesion along the inferior RIGHT hemi liver 4.3 cm as compared to 3.9 cm (image 51/2) Marked enlargement of a lesion along the inferior LEFT hemi liver medial segment (image 41/25.9 cm as compared to 2.7 cm.  Pancreas: Normal, without mass, inflammation or ductal dilatation. Spleen: Normal spleen. Adrenals/Urinary Tract: Adrenal glands with obscured LEFT adrenal secondary to large mass potentially involved. The LEFT adrenal is normal. The LEFT kidney is normal. Urinary bladder with smooth contour. RIGHT kidney with infiltrative mass contiguous with large mass in the RIGHT hemi liver. Invasion of RIGHT renal vein. Extension into IVC and via RIGHT hepatic vein from the intrahepatic portion of the mass. Stomach/Bowel: Stomach is unremarkable. Small bowel without dilation or adjacent stranding. Proximal colon is collapsed. Appendix not visualized. Stool in the distal colon. No adjacent stranding. No perianal stranding. Appendix appears to turn medially away from soft tissue in the pelvis and is not inflamed, no significant change in the appearance of this area since previous imaging Vascular/Lymphatic: Enlarging intra-aortocaval adenopathy in keeping with enlargement of disease elsewhere. 1.8 cm intra-aortocaval lymph node previously 1.5 cm. Increase in general and soft tissue in the intra-aortocaval groove. (Image 38/2) Scattered smaller lymph nodes throughout the retroperitoneum more conspicuous than on previous imaging. No pelvic adenopathy but with retroperitoneal soft tissue in the RIGHT retroperitoneum measuring 4.0 x 1.2 cm previously 4.4 x 1.5 cm. Aortic atherosclerosis. No aneurysmal dilation. Patent abdominal vessels. Reproductive: Post hysterectomy. Other: Small volume ascites in the pelvis.  No free air. Musculoskeletal: Spinal degenerative changes. No acute or destructive bone finding. IMPRESSION: 1. Enlarging hepatic lesions and new hepatic lesions with enlarging upper abdominal adenopathy as described. 2. Signs of pulmonary emboli, again noted in RIGHT lower lobe and perhaps with small new thrombus in LEFT lower lobe, difficult to assess on venous phase. 3. Signs of tumor and likely bland thrombus extending  into IVC and RIGHT hepatic vein as well as into the inferior vena cava just above the RIGHT hemidiaphragm. 4. Mixed response with respect to pulmonary findings. 5. Small volume ascites in the pelvis. 6. Aortic atherosclerosis. These results were called by telephone at the time of interpretation on 06/07/2020 at  3:43 pm to provider Daleen Bo , who verbally acknowledged these results. Electronically Signed: By: Zetta Bills M.D. On: 06/07/2020 15:43   DG Chest Port 1 View  Result Date: 06/07/2020 CLINICAL DATA:  Lung carcinoma with fever EXAM: PORTABLE CHEST 1 VIEW COMPARISON:  Chest CT May 04, 2020 FINDINGS: Multiple nodular opacities again noted throughout the lungs bilaterally. Largest nodular opacities in left lower lobe with largest nodular opacities measuring 4.1 x 2.8 cm and 4.3 x 3.2 cm. No edema or airspace opacity. Heart size and pulmonary vascular normal. No adenopathy evident by radiography. No bone lesions. 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 No edema or consolidation. Heart size normal. Electronically Signed   By: Lowella Grip III M.D.   On: 06/07/2020 12:18      Assessment/Plan Active Problems:   Bacteremia  (please populate well all problems here in Problem List. (For example, if patient is on BP meds at home and you resume or decide to hold them, it is a problem that needs to be her. Same for CAD, COPD, HLD and so on)   ***  DVT prophylaxis: *** (Lovenox/Heparin/SCD's/anticoagulated/None (if comfort care) Code Status: *** (Full/Partial (specify details) Family Communication: *** (Specify name, relationship. Do not write "discussed with patient". Specify tel # if discussed over the phone) Disposition Plan: *** (specify when and where you expect patient to be discharged) Consults called: *** (with names) Admission status: *** (inpatient / obs / tele / medical floor / SDU)  Level of care: Telemetry  Status  is: Inpatient  {Inpatient:23812}  Dispo: The patient is from: {From:23814}              Anticipated d/c is to: {To:23815}              Patient currently {Medically stable:23817}   Difficult to place patient {Yes/No:25151}         Orene Desanctis DO [facility]   If 7PM-7AM, please contact night-coverage [email or web address]   06/08/2020, 9:08 PM

## 2020-06-08 NOTE — ED Notes (Signed)
ED Provider at bedside. 

## 2020-06-08 NOTE — ED Provider Notes (Signed)
Emergency Medicine Provider Triage Evaluation Note  [name] , a 60 y.o. female  was evaluated in triage.  Pt complains of abnormal blood culture.  Review of Systems  Positive: R flank pain, fever Negative: Dysuria, cough  Physical Exam  BP 135/63 (BP Location: Left Arm)   Pulse 84   Temp 97.6 F (36.4 C) (Oral)   Resp 16   Ht 5\' 4"  (1.626 m)   Wt 56.2 kg   SpO2 98%   BMI 21.28 kg/m  Gen:   Awake, no distress, ill appearing Resp:  Normal effort  MSK:   Moves extremities without difficulty  Other:  R flank pain, abd pain  Medical Decision Making  Medically screening exam initiated at 4:09 PM.  Appropriate orders placed.  [name] was informed that the remainder of the evaluation will be completed by another provider, this initial triage assessment does not replace that evaluation, and the importance of remaining in the ED until their evaluation is complete.  Hx of RCC recently diagnosed and on immunotherapy, recently had fever after treatment, seen yesterday and had blood cultures obtained that show positive staph infection.  Here for IV abx.     Domenic Moras, PA-C 06/08/20 1611    Sherwood Gambler, MD 06/08/20 [id number]

## 2020-06-09 ENCOUNTER — Inpatient Hospital Stay (HOSPITAL_COMMUNITY): Payer: BC Managed Care – PPO

## 2020-06-09 DIAGNOSIS — R7881 Bacteremia: Secondary | ICD-10-CM | POA: Diagnosis not present

## 2020-06-09 DIAGNOSIS — E871 Hypo-osmolality and hyponatremia: Secondary | ICD-10-CM

## 2020-06-09 DIAGNOSIS — L8990 Pressure ulcer of unspecified site, unspecified stage: Secondary | ICD-10-CM | POA: Insufficient documentation

## 2020-06-09 DIAGNOSIS — R011 Cardiac murmur, unspecified: Secondary | ICD-10-CM | POA: Diagnosis not present

## 2020-06-09 DIAGNOSIS — E875 Hyperkalemia: Secondary | ICD-10-CM

## 2020-06-09 DIAGNOSIS — I10 Essential (primary) hypertension: Secondary | ICD-10-CM

## 2020-06-09 LAB — ECHOCARDIOGRAM COMPLETE
Area-P 1/2: 9.6 cm2
Calc EF: 60.5 %
Height: 64 in
S' Lateral: 2.2 cm
Single Plane A2C EF: 55.6 %
Single Plane A4C EF: 68.8 %
Weight: 1936 oz

## 2020-06-09 LAB — CBC
HCT: 25.7 % — ABNORMAL LOW (ref 36.0–46.0)
Hemoglobin: 7.7 g/dL — ABNORMAL LOW (ref 12.0–15.0)
MCH: 24 pg — ABNORMAL LOW (ref 26.0–34.0)
MCHC: 30 g/dL (ref 30.0–36.0)
MCV: 80.1 fL (ref 80.0–100.0)
Platelets: 260 10*3/uL (ref 150–400)
RBC: 3.21 MIL/uL — ABNORMAL LOW (ref 3.87–5.11)
RDW: 15.2 % (ref 11.5–15.5)
WBC: 11.2 10*3/uL — ABNORMAL HIGH (ref 4.0–10.5)
nRBC: 0 % (ref 0.0–0.2)

## 2020-06-09 LAB — COMPREHENSIVE METABOLIC PANEL
ALT: 25 U/L (ref 0–44)
AST: 72 U/L — ABNORMAL HIGH (ref 15–41)
Albumin: 2.4 g/dL — ABNORMAL LOW (ref 3.5–5.0)
Alkaline Phosphatase: 79 U/L (ref 38–126)
Anion gap: 9 (ref 5–15)
BUN: 14 mg/dL (ref 8–23)
CO2: 22 mmol/L (ref 22–32)
Calcium: 8.5 mg/dL — ABNORMAL LOW (ref 8.9–10.3)
Chloride: 103 mmol/L (ref 98–111)
Creatinine, Ser: 0.97 mg/dL (ref 0.44–1.00)
GFR, Estimated: >60 mL/min (ref >60)
Glucose, Bld: 87 mg/dL (ref 70–99)
Potassium: 3.8 mmol/L (ref 3.5–5.1)
Sodium: 134 mmol/L — ABNORMAL LOW (ref 135–145)
Total Bilirubin: 0.8 mg/dL (ref 0.3–1.2)
Total Protein: 5.9 g/dL — ABNORMAL LOW (ref 6.5–8.1)

## 2020-06-09 LAB — BASIC METABOLIC PANEL
Anion gap: 9 (ref 5–15)
BUN: 14 mg/dL (ref 8–23)
CO2: 23 mmol/L (ref 22–32)
Calcium: 8.7 mg/dL — ABNORMAL LOW (ref 8.9–10.3)
Chloride: 101 mmol/L (ref 98–111)
Creatinine, Ser: 0.91 mg/dL (ref 0.44–1.00)
GFR, Estimated: >60 mL/min (ref >60)
Glucose, Bld: 99 mg/dL (ref 70–99)
Potassium: 4.3 mmol/L (ref 3.5–5.1)
Sodium: 133 mmol/L — ABNORMAL LOW (ref 135–145)

## 2020-06-09 MED ORDER — SENNOSIDES-DOCUSATE SODIUM 8.6-50 MG PO TABS
1.0000 | ORAL_TABLET | Freq: Two times a day (BID) | ORAL | Status: DC
  Administered 2020-06-09 – 2020-06-12 (×7): 1 via ORAL
  Filled 2020-06-09 (×7): qty 1

## 2020-06-09 MED ORDER — ACETAMINOPHEN 325 MG PO TABS
650.0000 mg | ORAL_TABLET | Freq: Four times a day (QID) | ORAL | Status: DC | PRN
  Administered 2020-06-09 – 2020-06-11 (×5): 650 mg via ORAL
  Filled 2020-06-09 (×6): qty 2

## 2020-06-09 MED ORDER — SODIUM CHLORIDE 0.9 % IV SOLN: INTRAVENOUS | Status: DC

## 2020-06-09 MED ORDER — APIXABAN 5 MG PO TABS
5.0000 mg | ORAL_TABLET | Freq: Two times a day (BID) | ORAL | Status: DC
Start: 2020-06-09 — End: 2020-06-12
  Administered 2020-06-09 – 2020-06-12 (×8): 5 mg via ORAL
  Filled 2020-06-09 (×8): qty 1

## 2020-06-09 MED ORDER — ACETAMINOPHEN 325 MG PO TABS: 650.0000 mg | ORAL_TABLET | Freq: Four times a day (QID) | ORAL | Status: DC | PRN

## 2020-06-09 MED ORDER — IBUPROFEN 200 MG PO TABS
400.0000 mg | ORAL_TABLET | Freq: Four times a day (QID) | ORAL | Status: DC | PRN
  Filled 2020-06-09: qty 2

## 2020-06-09 MED ORDER — BISACODYL 5 MG PO TBEC
5.0000 mg | DELAYED_RELEASE_TABLET | Freq: Every day | ORAL | Status: DC | PRN
  Administered 2020-06-10: 5 mg via ORAL
  Filled 2020-06-09 (×2): qty 1

## 2020-06-09 MED ORDER — ONDANSETRON HCL 4 MG/2ML IJ SOLN: 4.0000 mg | Freq: Four times a day (QID) | INTRAMUSCULAR | Status: DC | PRN

## 2020-06-09 MED ORDER — FENOFIBRATE 54 MG PO TABS
54.0000 mg | ORAL_TABLET | Freq: Every day | ORAL | Status: DC
  Administered 2020-06-09 – 2020-06-12 (×4): 54 mg via ORAL
  Filled 2020-06-09 (×4): qty 1

## 2020-06-09 MED ORDER — TRAZODONE HCL 50 MG PO TABS
50.0000 mg | ORAL_TABLET | Freq: Once | ORAL | Status: AC
  Administered 2020-06-09: 50 mg via ORAL
  Filled 2020-06-09: qty 1

## 2020-06-09 MED ORDER — TRAZODONE HCL 50 MG PO TABS
50.0000 mg | ORAL_TABLET | Freq: Every evening | ORAL | Status: DC | PRN
  Administered 2020-06-09 – 2020-06-12 (×3): 50 mg via ORAL
  Filled 2020-06-09 (×3): qty 1

## 2020-06-09 NOTE — Progress Notes (Signed)
[name]   DOB:[birth year]   EL#:[id number]   CHE#:[id number]   Asssessment/Plan  This is a very pleasant 60 year old female patient with metastatic clear-cell renal cell carcinoma, extensive disease who presented to the ER with chief complaint of nausea, fever and worsening right-sided abdominal pain.  We have discussed with her about inpatient monitoring given possibility of infection and sepsis however patient wanted to go home to be discharged. Blood cultures however started growing, positive cocci identified as Staph epidermidis, methicillin-resistant and hence she was re admitted. He had repeat imaging which showed enlarging hepatic lesion, PE, bland and tumor thrombus, small volume ascites She is currently on IV abx, Vancomycin, please consider consult to ID, if not already consulted. Source is unclear. ECHO showed floating thrombus in IVC, please consult with cardiology if any additional intervention is needed beyond anticoagulation. Her current sepsis is unrelated to her treatment, discussed this with patient. Abx duration and choice per ID and hospitalist team. We will monitor her periodically inpatient, can continue axitinib if she is able to tolerate PO diet, she can bring this from home. Thank you for your assistance in caring for ourpatient .  Subjective:   Patient is resting in bed, feeling much better compared to 2 days ago. She has more appetite and is less nauseated Ongoing right flank pain and right sided abdominal pain for which she is taking morphine. No diarrhea.  No skin rash No hematuria.  Objective:  Vitals:   06/09/20 1202 06/09/20 1252  BP: (!) 131/58 115/61  Pulse: (!) 101 96  Resp: (!) 28   Temp: 99.7 F (37.6 C) 98.6 F (37 C)  SpO2: 93% 92%    Body mass index is 20.77 kg/m.  Intake/Output Summary (Last 24 hours) at 06/09/2020 1329 Last data filed at 06/09/2020 0826 Gross per 24 hour  Intake 1007.5 ml  Output 400 ml  Net 607.5 ml     Sclerae  unicteric  Heart regular rate and rhythm  Abdomen right upper abdominal pain radiating to the right lumbar and iliac area. Right flank pain. NO guarding or rigidity today. ,           LE no peripheral edema  Neuro nonfocal   CBG (last 3)  No results for input(s): GLUCAP in the last 72 hours.   Labs:  Lab Results  Component Value Date   WBC 11.2 (H) 06/09/2020   HGB 7.7 (L) 06/09/2020   HCT 25.7 (L) 06/09/2020   MCV 80.1 06/09/2020   PLT 260 06/09/2020   NEUTROABS 9.9 (H) 06/08/2020     Urine Studies No results for input(s): UHGB, CRYS in the last 72 hours.  Invalid input(s): UACOL, UAPR, USPG, UPH, UTP, UGL, UKET, UBIL, UNIT, UROB, [place], UEPI, UWBC, [name] [name], [place]  Basic Metabolic Panel: Recent Labs  Lab 06/04/20 1303 06/07/20 1110 06/08/20 1634 06/08/20 2357 06/09/20 0341  NA 130* 130* 129* 133* 134*  K 4.2 3.7 5.2* 4.3 3.8  CL 97* 98 96* 101 103  CO2 25 23  --  23 22  GLUCOSE 238* 161* 108* 99 87  BUN 15 16 19 14 14   CREATININE 0.87 0.83 0.90 0.91 0.97  CALCIUM 11.2* 9.2  --  8.7* 8.5*   GFR Estimated Creatinine Clearance: 51.9 mL/min (by C-G formula based on SCr of 0.97 mg/dL). Liver Function Tests: Recent Labs  Lab 06/04/20 1303 06/07/20 1110 06/09/20 0341  AST 60* 99* 72*  ALT 19 28 25   ALKPHOS 97 93 79  BILITOT 0.3 0.6 0.8  PROT 6.9 6.7 5.9*  ALBUMIN 2.8* 2.9* 2.4*   No results for input(s): LIPASE, AMYLASE in the last 168 hours. No results for input(s): AMMONIA in the last 168 hours. Coagulation profile Recent Labs  Lab 06/07/20 1110  INR 2.2*    CBC: Recent Labs  Lab 06/04/20 1303 06/07/20 1110 06/08/20 1609 06/08/20 1634 06/09/20 0341  WBC 6.9 11.8* 13.0*  --  11.2*  NEUTROABS 4.6 9.9* 9.9*  --   --   HGB 8.3* 9.0* 9.6* 10.2* 7.7*  HCT 27.3* 29.5* 32.2* 30.0* 25.7*  MCV 79.8* 78.5* 81.1  --  80.1  PLT 334 278 298  --  260   Cardiac Enzymes: No results for input(s): CKTOTAL, CKMB, CKMBINDEX, TROPONINI in the  last 168 hours. BNP: Invalid input(s): POCBNP CBG: No results for input(s): GLUCAP in the last 168 hours. D-Dimer No results for input(s): DDIMER in the last 72 hours. Hgb A1c No results for input(s): HGBA1C in the last 72 hours. Lipid Profile No results for input(s): CHOL, HDL, LDLCALC, TRIG, CHOLHDL, LDLDIRECT in the last 72 hours. Thyroid function studies No results for input(s): TSH, T4TOTAL, T3FREE, THYROIDAB in the last 72 hours.  Invalid input(s): FREET3 Anemia work up No results for input(s): VITAMINB12, FOLATE, FERRITIN, TIBC, IRON, RETICCTPCT in the last 72 hours. Microbiology Recent Results (from the past 240 hour(s))  Blood Culture (routine x 2)     Status: None (Preliminary result)   Collection Time: 06/07/20 11:10 AM   Specimen: BLOOD  Result Value Ref Range Status   Specimen Description   Final    BLOOD LEFT ANTECUBITAL Performed at [facility], [place] [address], [place], [place] [address]    Special Requests   Final    BOTTLES DRAWN AEROBIC AND ANAEROBIC Blood Culture adequate volume Performed at [facility] [address], [place], [place] [address]    Culture  Setup Time   Final    GRAM POSITIVE COCCI AEROBIC BOTTLE ONLY CRITICAL VALUE NOTED.  VALUE IS CONSISTENT WITH PREVIOUSLY REPORTED AND CALLED VALUE. Performed at [facility], [place] [address], [place], [place] [address]    Culture GRAM POSITIVE COCCI  Final   Report Status PENDING  Incomplete  Blood Culture (routine x 2)     Status: Abnormal (Preliminary result)   Collection Time: 06/07/20 11:10 AM   Specimen: BLOOD  Result Value Ref Range Status   Specimen Description   Final    BLOOD RIGHT ANTECUBITAL Performed at [facility], [place] [address] [place], [place] [address]    Special Requests   Final    BOTTLES DRAWN AEROBIC AND ANAEROBIC Blood Culture adequate volume Performed at [facility] [address], [place], [place] [address]     Culture  Setup Time   Final    GRAM POSITIVE COCCI IN BOTH AEROBIC AND ANAEROBIC BOTTLES CRITICAL RESULT CALLED TO, READ BACK BY AND VERIFIED WITH: Christina COBLE RN @1221  06/08/20 EB    Culture (A)  Final    STAPHYLOCOCCUS EPIDERMIDIS CULTURE REINCUBATED FOR BETTER GROWTH SUSCEPTIBILITIES TO FOLLOW Performed at [facility], [place] [address] [place], [place] [address]    Report Status PENDING  Incomplete  Blood Culture ID Panel (Reflexed)     Status: Abnormal   Collection Time: 06/07/20 11:10 AM  Result Value Ref Range Status   Enterococcus faecalis NOT DETECTED NOT DETECTED Final   Enterococcus Faecium NOT DETECTED NOT DETECTED  Final   Listeria monocytogenes NOT DETECTED NOT DETECTED Final   Staphylococcus species DETECTED (A) NOT DETECTED Final    Comment: CRITICAL RESULT CALLED TO, READ BACK BY AND VERIFIED WITH: Christina COBLE RN @1221  06/08/20 EB    Staphylococcus aureus (BCID) NOT DETECTED NOT DETECTED Final   Staphylococcus epidermidis DETECTED (A) NOT DETECTED Final    Comment: Methicillin (oxacillin) resistant coagulase negative staphylococcus. Possible blood culture contaminant (unless isolated from more than one blood culture draw or clinical case suggests pathogenicity). No antibiotic treatment is indicated for blood  culture contaminants. CRITICAL RESULT CALLED TO, READ BACK BY AND VERIFIED WITH: Christina COBLE RN @1221  06/08/20 EB    Staphylococcus lugdunensis NOT DETECTED NOT DETECTED Final   Streptococcus species NOT DETECTED NOT DETECTED Final   Streptococcus agalactiae NOT DETECTED NOT DETECTED Final   Streptococcus pneumoniae NOT DETECTED NOT DETECTED Final   Streptococcus pyogenes NOT DETECTED NOT DETECTED Final   A.calcoaceticus-baumannii NOT DETECTED NOT DETECTED Final   Bacteroides fragilis NOT DETECTED NOT DETECTED Final   Enterobacterales NOT DETECTED NOT DETECTED Final   Enterobacter cloacae complex NOT DETECTED NOT DETECTED Final   Escherichia coli NOT DETECTED NOT  DETECTED Final   Klebsiella aerogenes NOT DETECTED NOT DETECTED Final   Klebsiella oxytoca NOT DETECTED NOT DETECTED Final   Klebsiella pneumoniae NOT DETECTED NOT DETECTED Final   Proteus species NOT DETECTED NOT DETECTED Final   Salmonella species NOT DETECTED NOT DETECTED Final   Serratia marcescens NOT DETECTED NOT DETECTED Final   Haemophilus influenzae NOT DETECTED NOT DETECTED Final   Neisseria meningitidis NOT DETECTED NOT DETECTED Final   Pseudomonas aeruginosa NOT DETECTED NOT DETECTED Final   Stenotrophomonas maltophilia NOT DETECTED NOT DETECTED Final   Candida albicans NOT DETECTED NOT DETECTED Final   Candida auris NOT DETECTED NOT DETECTED Final   Candida glabrata NOT DETECTED NOT DETECTED Final   Candida krusei NOT DETECTED NOT DETECTED Final   Candida parapsilosis NOT DETECTED NOT DETECTED Final   Candida tropicalis NOT DETECTED NOT DETECTED Final   Cryptococcus neoformans/gattii NOT DETECTED NOT DETECTED Final   Methicillin resistance mecA/C DETECTED (A) NOT DETECTED Final    Comment: CRITICAL RESULT CALLED TO, READ BACK BY AND VERIFIED WITH: Christina COBLE RN @1221  06/08/20 EB Performed at [facility], [address], [place], [place] [address]   Resp Panel by RT-PCR (Flu A&B, Covid) Nasopharyngeal Swab     Status: None   Collection Time: 06/07/20 12:08 PM   Specimen: Nasopharyngeal Swab; Nasopharyngeal(NP) swabs in vial transport medium  Result Value Ref Range Status   SARS Coronavirus 2 by RT PCR NEGATIVE NEGATIVE Final    Comment: (NOTE) SARS-CoV-2 target nucleic acids are NOT DETECTED.  The SARS-CoV-2 RNA is generally detectable in upper respiratory specimens during the acute phase of infection. The lowest concentration of SARS-CoV-2 viral copies this assay can detect is 138 copies/mL. A negative result does not preclude SARS-Cov-2 infection and should not be used as the sole basis for treatment or other patient management decisions. A negative result may  occur with  improper specimen collection/handling, submission of specimen other than nasopharyngeal swab, presence of viral mutation(s) within the areas targeted by this assay, and inadequate number of viral copies(<138 copies/mL). A negative result must be combined with clinical observations, patient history, and epidemiological information. The expected result is Negative.  Fact Sheet for Patients:  EntrepreneurPulse.com.au  Fact Sheet for Healthcare Providers:  IncredibleEmployment.be  This test is no t yet approved or cleared by  the [employer] and  has been authorized for detection and/or diagnosis of SARS-CoV-2 by FDA under an Emergency Use Authorization (EUA). This EUA will remain  in effect (meaning this test can be used) for the duration of the COVID-19 declaration under Section 564(b)(1) of the Act, 21 U.S.C.section 360bbb-3(b)(1), unless the authorization is terminated  or revoked sooner.       Influenza A by PCR NEGATIVE NEGATIVE Final   Influenza B by PCR NEGATIVE NEGATIVE Final    Comment: (NOTE) The Xpert Xpress SARS-CoV-2/FLU/RSV plus assay is intended as an aid in the diagnosis of influenza from Nasopharyngeal swab specimens and should not be used as a sole basis for treatment. Nasal washings and aspirates are unacceptable for Xpert Xpress SARS-CoV-2/FLU/RSV testing.  Fact Sheet for Patients: EntrepreneurPulse.com.au  Fact Sheet for Healthcare Providers: IncredibleEmployment.be  This test is not yet approved or cleared by the [place] FDA and has been authorized for detection and/or diagnosis of SARS-CoV-2 by FDA under an Emergency Use Authorization (EUA). This EUA will remain in effect (meaning this test can be used) for the duration of the COVID-19 declaration under Section 564(b)(1) of the Act, 21 U.S.C. section 360bbb-3(b)(1), unless the authorization is terminated  or revoked.  Performed at [facility], [place] [address] [place], [place] [address]   Urine culture     Status: None   Collection Time: 06/07/20  1:34 PM   Specimen: In/Out Cath Urine  Result Value Ref Range Status   Specimen Description   Final    IN/OUT CATH URINE Performed at [facility] [address], [place], [place] [address]    Special Requests   Final    NONE Performed at [facility], [place] [address] [place], [place] [address]    Culture   Final    NO GROWTH Performed at [facility], [place] [address], [place], [place] [address]    Report Status 06/08/2020 FINAL  Final  Blood culture (routine x 2)     Status: None (Preliminary result)   Collection Time: 06/08/20  4:09 PM   Specimen: BLOOD LEFT FOREARM  Result Value Ref Range Status   Specimen Description   Final    BLOOD LEFT FOREARM Performed at [facility] [address] [place], [place] [address]    Special Requests   Final    BOTTLES DRAWN AEROBIC AND ANAEROBIC Blood Culture results may not be optimal due to an inadequate volume of blood received in culture bottles Performed at [facility] [address] [place], [place] [address]    Culture   Final    NO GROWTH < 24 HOURS Performed at [facility] [address] [place], [place] [address]    Report Status PENDING  Incomplete  Blood culture (routine x 2)     Status: None (Preliminary result)   Collection Time: 06/08/20  4:14 PM   Specimen: BLOOD RIGHT FOREARM  Result Value Ref Range Status   Specimen Description   Final    BLOOD RIGHT FOREARM Performed at [facility] [address], [place], [place] [address]    Special Requests   Final    BOTTLES DRAWN AEROBIC ONLY Blood Culture results may not be optimal due to an inadequate volume of blood received in culture bottles Performed at [facility] [address], [place], [place] [address]    Culture   Final  NO GROWTH < 24 HOURS Performed at [facility] [address] [place], [place] [address]    Report Status PENDING  Incomplete      Studies:  CT Abdomen Pelvis W Contrast  Addendum Date: 06/07/2020   ADDENDUM REPORT: 06/07/2020 17:27 ADDENDUM: Thrombus in a branch of the SMV in the RIGHT lower quadrant is mildly expansile and may reflect tumor thrombus in this area. There are collateral pathways through the mesentery. The vessel upstream from this is patent. This was likely present previously. Attention on follow-up is suggested. Would also correlate with any worsening RIGHT lower quadrant pain. These results were called by telephone at the time of interpretation on 06/07/2020 at 5:25 pm to provider Dr. Ronnald Nian, Who verbally acknowledged these results. Electronically Signed   By: Zetta Bills M.D.   On: 06/07/2020 17:27   Result Date: 06/07/2020 CLINICAL DATA:  Abdominal abscess, infection suspected. Currently on immunotherapy with nausea vomiting and fever. History of RIGHT renal cell carcinoma. EXAM: CT ABDOMEN AND PELVIS WITH CONTRAST TECHNIQUE: Multidetector CT imaging of the abdomen and pelvis was performed using the standard protocol following bolus administration of intravenous contrast. CONTRAST:  165mL OMNIPAQUE IOHEXOL 300 MG/ML  SOLN COMPARISON:  Prior imaging from May 04, 2020. FINDINGS: Lower chest: Large LEFT lower lobe mass 3.9 x 2.8 cm previously approximately 4.3 x 3.2 cm. Medial lung base on the LEFT 3 cm compared to 3.3 cm mass. Medial lung base on the RIGHT (image 7/4) 2.8 cm RIGHT lower lobe mass previously approximately 2.4 cm greatest axial dimension. Other masses show a similar appearance to the prior study obtain just a short time ago. No effusion. No consolidation. Suspected RIGHT lower lobe pulmonary embolism (image 89/5) potential embolism also on image 95/5 in the LEFT lower lobe Hepatobiliary: Thrombus  extending directly into the IVC from a large mass in the RIGHT liver and from the RIGHT renal vein, extending just above the IVC confluence. Grossly similar to the prior study. Large RIGHT hepatic lobe mass with heterogeneous enhancement measuring 12 cm greatest axial dimension extending from the RIGHT renal mass. Grossly similar. Anterior RIGHT hepatic lobe mass (image 10/2 6.5 as compared to 6.3 cm. The show heterogeneous and peripheral enhancement. New lesion along the anterior surface of the medial segment LEFT hepatic lobe (image 28/2) 17 mm. New lesion on image 32/2 in hepatic subsegment V 16 mm. Enlarging lesions in the RIGHT hepatic lobe in hepatic subsegment VI (image 43/2) subcentimeter area on the prior study as large is 2.9 cm on today's study. Lesion along the inferior RIGHT hemi liver 4.3 cm as compared to 3.9 cm (image 51/2) Marked enlargement of a lesion along the inferior LEFT hemi liver medial segment (image 41/25.9 cm as compared to 2.7 cm. Pancreas: Normal, without mass, inflammation or ductal dilatation. Spleen: Normal spleen. Adrenals/Urinary Tract: Adrenal glands with obscured LEFT adrenal secondary to large mass potentially involved. The LEFT adrenal is normal. The LEFT kidney is normal. Urinary bladder with smooth contour. RIGHT kidney with infiltrative mass contiguous with large mass in the RIGHT hemi liver. Invasion of RIGHT renal vein. Extension into IVC and via RIGHT hepatic vein from the intrahepatic portion of the mass. Stomach/Bowel: Stomach is unremarkable. Small bowel without dilation or adjacent stranding. Proximal colon is collapsed. Appendix not visualized. Stool in the distal colon. No adjacent stranding. No perianal stranding. Appendix appears to turn medially away from soft tissue in the pelvis and is not inflamed, no significant change in the appearance of  this area since previous imaging Vascular/Lymphatic: Enlarging intra-aortocaval adenopathy in keeping with enlargement of  disease elsewhere. 1.8 cm intra-aortocaval lymph node previously 1.5 cm. Increase in general and soft tissue in the intra-aortocaval groove. (Image 38/2) Scattered smaller lymph nodes throughout the retroperitoneum more conspicuous than on previous imaging. No pelvic adenopathy but with retroperitoneal soft tissue in the RIGHT retroperitoneum measuring 4.0 x 1.2 cm previously 4.4 x 1.5 cm. Aortic atherosclerosis. No aneurysmal dilation. Patent abdominal vessels. Reproductive: Post hysterectomy. Other: Small volume ascites in the pelvis.  No free air. Musculoskeletal: Spinal degenerative changes. No acute or destructive bone finding. IMPRESSION: 1. Enlarging hepatic lesions and new hepatic lesions with enlarging upper abdominal adenopathy as described. 2. Signs of pulmonary emboli, again noted in RIGHT lower lobe and perhaps with small new thrombus in LEFT lower lobe, difficult to assess on venous phase. 3. Signs of tumor and likely bland thrombus extending into IVC and RIGHT hepatic vein as well as into the inferior vena cava just above the RIGHT hemidiaphragm. 4. Mixed response with respect to pulmonary findings. 5. Small volume ascites in the pelvis. 6. Aortic atherosclerosis. These results were called by telephone at the time of interpretation on 06/07/2020 at 3:43 pm to provider [facility] , who verbally acknowledged these results. Electronically Signed: By: Zetta Bills M.D. On: 06/07/2020 15:43   ECHOCARDIOGRAM COMPLETE  Result Date: 06/09/2020    ECHOCARDIOGRAM REPORT   Patient Name:   [name] Date of Exam: 06/09/2020 Medical Rec #:  [id number]       Height:       64.0 in Accession #:    [id number]      Weight:       121.0 lb Date of Birth:  [birth year]        BSA:          1.580 m Patient Age:    60 years        BP:           162/74 mmHg Patient Gender: F               HR:           113 bpm. Exam Location:  Inpatient Procedure: 2D Echo, 3D Echo, Cardiac Doppler and Color Doppler Indications:     R01.1 Murmur  History:        Patient has no prior history of Echocardiogram examinations.                 Abnormal ECG, Stroke, Signs/Symptoms:Bacteremia; Risk                 Factors:Hypertension. Cancer. Pulmonary embolus.  Sonographer:    Roseanna Rainbow RDCS Referring Phys: [id number] Metcalf T TU  Sonographer Comments: Technically difficult study due to poor echo windows, suboptimal parasternal window and suboptimal apical window. IMPRESSIONS  1. Left ventricular ejection fraction, by estimation, is 60 to 65%. Left ventricular ejection fraction by 3D volume is 59 %. The left ventricle has normal function. The left ventricle has no regional wall motion abnormalities. There is mild concentric left ventricular hypertrophy. Indeterminate diastolic filling due to E-A fusion.  2. Right ventricular systolic function is normal. The right ventricular size is normal. There is mildly elevated pulmonary artery systolic pressure.  3. Left atrial size was mildly dilated.  4. The mitral valve is normal in structure. Trivial mitral valve regurgitation. No evidence of mitral stenosis.  5. The aortic valve is normal in structure. Aortic valve regurgitation is  not visualized. No aortic stenosis is present.  6. There is a mobile, nonobstructive mass in the proximal inferior vena cava, measuring at least 3 cm in length, roughly 1 cm in diameter, may represent a thrombembolus in transit or extension of renal cell carcinoma. The inferior vena cava is dilated in size with <50% respiratory variability, suggesting right atrial pressure of 15 mmHg. Conclusion(s)/Recommendation(s): No evidence of valvular vegetations on this transthoracic echocardiogram. Would recommend a transesophageal echocardiogram to exclude infective endocarditis if clinically indicated. 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 FINDINGS  Left Ventricle: Left ventricular ejection fraction, by estimation, is 60 to 65%. Left ventricular ejection fraction by 3D volume is 59 %. The left  ventricle has normal function. The left ventricle has no regional wall motion abnormalities. The left ventricular internal cavity size was normal in size. There is mild concentric left ventricular hypertrophy. Indeterminate diastolic filling due to E-A fusion. Normal left ventricular filling pressure. Right Ventricle: The right ventricular size is normal. No increase in right ventricular wall thickness. Right ventricular systolic function is normal. There is mildly elevated pulmonary artery systolic pressure. The tricuspid regurgitant velocity is 2.59  m/s, and with an assumed right atrial pressure of 15 mmHg, the estimated right ventricular systolic pressure is 10.9 mmHg. Left Atrium: Left atrial size was mildly dilated. Right Atrium: Right atrial size was normal in size. Prominent Eustachian valve. Pericardium: There is no evidence of pericardial effusion. Mitral Valve: The mitral valve is normal in structure. Trivial mitral valve regurgitation. No evidence of mitral valve stenosis. Tricuspid Valve: The tricuspid valve is normal in structure. Tricuspid valve regurgitation is trivial. No evidence of tricuspid stenosis. Aortic Valve: The aortic valve is normal in structure. Aortic valve regurgitation is not visualized. No aortic stenosis is present. Pulmonic Valve: The pulmonic valve was normal in structure. Pulmonic valve regurgitation is not visualized. No evidence of pulmonic stenosis. Aorta: The aortic root is normal in size and structure. Venous: There is a mobile, nonobstructive mass in the proximal inferior vena cava, measuring at least 3 cm in length, roughly 1 cm in diameter, may represent a thrombembolus in transit or extension of renal cell carcinoma. The inferior vena cava is dilated in size with less than 50% respiratory variability, suggesting right atrial pressure of 15 mmHg. IAS/Shunts: No atrial level shunt detected by color flow Doppler.  LEFT VENTRICLE PLAX 2D LVIDd:         3.60 cm LVIDs:          2.20 cm LV PW:         1.25 cm         3D Volume EF LV IVS:        1.20 cm         LV 3D EF:    Left LVOT diam:     2.00 cm                      ventricular LV SV:         79                           ejection LV SV Index:   50                           fraction by LVOT Area:     3.14 cm  3D volume                                             is 59 %.  LV Volumes (MOD) LV vol d, MOD    75.9 ml       3D Volume EF: A2C:                           3D EF:        59 % LV vol d, MOD    52.0 ml       LV EDV:       113 ml A4C:                           LV ESV:       46 ml LV vol s, MOD    33.7 ml       LV SV:        67 ml A2C: LV vol s, MOD    16.2 ml A4C: LV SV MOD A2C:   42.2 ml LV SV MOD A4C:   52.0 ml LV SV MOD BP:    39.6 ml RIGHT VENTRICLE             IVC RV S prime:     15.50 cm/s  IVC diam: 2.70 cm TAPSE (M-mode): 2.2 cm LEFT ATRIUM           Index       RIGHT ATRIUM           Index LA diam:      4.30 cm 2.72 cm/m  RA Area:     10.60 cm LA Vol (A2C): 27.7 ml 17.53 ml/m RA Volume:   19.50 ml  12.34 ml/m LA Vol (A4C): 50.6 ml 32.02 ml/m  AORTIC VALVE LVOT Vmax:   155.00 cm/s LVOT Vmean:  89.300 cm/s LVOT VTI:    0.251 m  AORTA Ao Root diam: 3.10 cm Ao Asc diam:  3.50 cm MITRAL VALVE               TRICUSPID VALVE MV Area (PHT): 9.60 cm    TR Peak grad:   26.8 mmHg MV Decel Time: 79 msec     TR Vmax:        259.00 cm/s MV E velocity: 98.80 cm/s                            SHUNTS                            Systemic VTI:  0.25 m                            Systemic Diam: 2.00 cm Dani Gobble Croitoru MD Electronically signed by Sanda Klein MD Signature Date/Time: 06/09/2020/11:13:00 AM    Final      Benay Pike, MD 06/09/2020  1:29 PM

## 2020-06-09 NOTE — Progress Notes (Signed)
PROGRESS NOTE    [name]  WUJ:[id number] DOB: [birth year] DOA: 06/08/2020 PCP: Josetta Huddle, MD   Brief Narrative:  [name] is a 60 y.o. female with medical history significant for renal cell carcinoma with metastasis, PE on Eliquis (dx 05/04/20), CVA, hypertension, hyperlipidemia and migraine who presents to the ED for a call back for positive staph culture. Patient was evaluated in the ED yesterday for fever, nausea and vomiting the day following her first immunotherapy infusion on Monday for renal cell carcinoma.  Fever of up to 103.  She had mild leukocytosis at 11.8 and negative CT abdomen.  She was also evaluated bedside by oncology and thought fever could be viral. She was discharged home with follow-up outpatient.  However both sets of blood cultures grew MRSA and she received call back to return back to ER today. Other than nausea and vomiting being new.  Patient continues to have her usual right-sided abdominal pain from her cancer.  CT abdomen and pelvis yesterday with no new acute findings.  Also notes a cough for the past several weeks without fever.  No new worsening shortness of breath.  She has metastasis foci in the lung bilaterally.  Denies any dysuria.  She had a UA yesterday with negative urine culture.  She does not have any other ports or IV lines. Hospitalist called for admission, ID to follow along per protocol.  Assessment & Plan:   Principal Problem:   Bacteremia Active Problems:   Metastatic renal cell carcinoma (HCC)   Pulmonary embolism (HCC)   Hyponatremia   Hyperkalemia   HTN (hypertension)   Pressure injury of skin   Sepsis secondary to staph bacteremia, POA - Pt with fever, nausea and vomiting following first session of immunotherapy infusion Monday - Blood culture positive x2 for MRSA althought Staph epidermitis also seen - ID to follow per protocol - unclear source- had had cough for weeks but no fever at that time or increasing shortness of  breath to suggest pneumonia- will obtain sputum culture - continue IV Vanc - new blood cultures pending - Echo without any valvular abnormalities - will follow up with ID - likely to need TEE  Hyponatremia, hypovolemic -  Likely secondary to N/V prior to admission -  Follow with morning labs with continuous IV fluids  Mild hyperkalemia, resolved -follow with repeat labs after fluids  Renal cell carcinoma 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 Rob Hickman note prior to admit - pt can hold axitinib if unable to tolerate PO  PE on Eliquis - Recently diagnosed - likely secondary to cancer history - first noted on imaging 05/04/20  - continue Eliquis   HTN hold lisinopril-HCTZ due to hyponatremia   DVT prophylaxis: Eliquis Code Status: DNR Family Communication: None present  Status is: Inpt  Dispo: The patient is from: Home              Anticipated d/c is to: TBD              Anticipated d/c date is: 48-72h              Patient currently NOT medically stable for discharge - still requiring further workup/imaging and IV antibiotics  Consultants:   ID, HemeOnc  Procedures:   None  Antimicrobials:  Vancomycin 5/6 - ongoing   Subjective: No acute issues/events overnight, N/V resolved - poor PO intake and weakness/fatigue ongoing.  Objective: Vitals:   06/09/20 0207 06/09/20 9562  06/09/20 0225 06/09/20 0348  BP:  (!) 142/63  124/76  Pulse:  (!) 103    Resp:    18  Temp: (!) 100.5 F (38.1 C) (!) 100.5 F (38.1 C) 98.5 F (36.9 C) 98.3 F (36.8 C)  TempSrc:  Oral Oral Oral  SpO2:  99%  95%  Weight: 54.9 kg 54.9 kg    Height: 5\' 4"  (1.626 m) 5\' 4"  (1.626 m)      Intake/Output Summary (Last 24 hours) at 06/09/2020 0736 Last data filed at 06/09/2020 0300 Gross per 24 hour  Intake 1007.5 ml  Output --  Net 1007.5 ml   Filed Weights   06/08/20 1547 06/09/20 0207 06/09/20 0207  Weight: 56.2 kg 54.9 kg 54.9 kg     Examination:  General exam: Appears calm and comfortable  Respiratory system: Clear to auscultation. Respiratory effort normal. Cardiovascular system: S1 & S2 heard, RRR. No JVD, murmurs, rubs, gallops or clicks. No pedal edema. Gastrointestinal system: Abdomen is nondistended, soft and nontender. No organomegaly or masses felt. Normal bowel sounds heard. Central nervous system: Alert and oriented. No focal neurological deficits. Extremities: Symmetric 5 x 5 power. Skin: No rashes, lesions or ulcers Psychiatry: Judgement and insight appear normal. Mood & affect appropriate.     Data Reviewed: I have personally reviewed following labs and imaging studies  CBC: Recent Labs  Lab 06/04/20 1303 06/07/20 1110 06/08/20 1609 06/08/20 1634 06/09/20 0341  WBC 6.9 11.8* 13.0*  --  11.2*  NEUTROABS 4.6 9.9* 9.9*  --   --   HGB 8.3* 9.0* 9.6* 10.2* 7.7*  HCT 27.3* 29.5* 32.2* 30.0* 25.7*  MCV 79.8* 78.5* 81.1  --  80.1  PLT 334 278 298  --  924   Basic Metabolic Panel: Recent Labs  Lab 06/04/20 1303 06/07/20 1110 06/08/20 1634 06/08/20 2357 06/09/20 0341  NA 130* 130* 129* 133* 134*  K 4.2 3.7 5.2* 4.3 3.8  CL 97* 98 96* 101 103  CO2 25 23  --  23 22  GLUCOSE 238* 161* 108* 99 87  BUN 15 16 19 14 14   CREATININE 0.87 0.83 0.90 0.91 0.97  CALCIUM 11.2* 9.2  --  8.7* 8.5*   GFR: Estimated Creatinine Clearance: 51.9 mL/min (by C-G formula based on SCr of 0.97 mg/dL). Liver Function Tests: Recent Labs  Lab 06/04/20 1303 06/07/20 1110 06/09/20 0341  AST 60* 99* 72*  ALT 19 28 25   ALKPHOS 97 93 79  BILITOT 0.3 0.6 0.8  PROT 6.9 6.7 5.9*  ALBUMIN 2.8* 2.9* 2.4*   No results for input(s): LIPASE, AMYLASE in the last 168 hours. No results for input(s): AMMONIA in the last 168 hours. Coagulation Profile: Recent Labs  Lab 06/07/20 1110  INR 2.2*   Cardiac Enzymes: No results for input(s): CKTOTAL, CKMB, CKMBINDEX, TROPONINI in the last 168 hours. BNP (last 3  results) No results for input(s): PROBNP in the last 8760 hours. HbA1C: No results for input(s): HGBA1C in the last 72 hours. CBG: No results for input(s): GLUCAP in the last 168 hours. Lipid Profile: No results for input(s): CHOL, HDL, LDLCALC, TRIG, CHOLHDL, LDLDIRECT in the last 72 hours. Thyroid Function Tests: No results for input(s): TSH, T4TOTAL, FREET4, T3FREE, THYROIDAB in the last 72 hours. Anemia Panel: No results for input(s): VITAMINB12, FOLATE, FERRITIN, TIBC, IRON, RETICCTPCT in the last 72 hours. Sepsis Labs: Recent Labs  Lab 06/07/20 1110 06/07/20 1324 06/08/20 1609 06/08/20 1809  LATICACIDVEN 1.5 1.7 2.0* 2.0*    Recent  Results (from the past 240 hour(s))  Blood Culture (routine x 2)     Status: None (Preliminary result)   Collection Time: 06/07/20 11:10 AM   Specimen: BLOOD  Result Value Ref Range Status   Specimen Description   Final    BLOOD LEFT ANTECUBITAL Performed at [facility], [place] [address], [place], [place] [address]    Special Requests   Final    BOTTLES DRAWN AEROBIC AND ANAEROBIC Blood Culture adequate volume Performed at [facility] [address] [place], [place] [address]    Culture  Setup Time   Final    GRAM POSITIVE COCCI AEROBIC BOTTLE ONLY CRITICAL VALUE NOTED.  VALUE IS CONSISTENT WITH PREVIOUSLY REPORTED AND CALLED VALUE. Performed at [facility], [place] [address], [place], [place] [address]    Culture GRAM POSITIVE COCCI  Final   Report Status PENDING  Incomplete  Blood Culture (routine x 2)     Status: None (Preliminary result)   Collection Time: 06/07/20 11:10 AM   Specimen: BLOOD  Result Value Ref Range Status   Specimen Description   Final    BLOOD RIGHT ANTECUBITAL Performed at [facility], [place] [address] [place], [place] [address]    Special Requests   Final    BOTTLES DRAWN AEROBIC AND ANAEROBIC Blood Culture adequate volume Performed at [facility] [address] [place], [place] [address]    Culture  Setup Time   Final    GRAM POSITIVE COCCI IN BOTH AEROBIC AND ANAEROBIC BOTTLES CRITICAL RESULT CALLED TO, READ BACK BY AND VERIFIED WITH: SAM COBLE RN @1221  06/08/20 EB Performed at [facility], [place] [address] [place], [place] [address]    Culture GRAM POSITIVE COCCI  Final   Report Status PENDING  Incomplete  Blood Culture ID Panel (Reflexed)     Status: Abnormal   Collection Time: 06/07/20 11:10 AM  Result Value Ref Range Status   Enterococcus faecalis NOT DETECTED NOT DETECTED Final   Enterococcus Faecium NOT DETECTED NOT DETECTED Final   Listeria monocytogenes NOT DETECTED NOT DETECTED Final   Staphylococcus species DETECTED (A) NOT DETECTED Final    Comment: CRITICAL RESULT CALLED TO, READ BACK BY AND VERIFIED WITH: SAM COBLE RN @1221  06/08/20 EB    Staphylococcus aureus (BCID) NOT DETECTED NOT DETECTED Final   Staphylococcus epidermidis DETECTED (A) NOT DETECTED Final    Comment: Methicillin (oxacillin) resistant coagulase negative staphylococcus. Possible blood culture contaminant (unless isolated from more than one blood culture draw or clinical case suggests pathogenicity). No antibiotic treatment is indicated for blood  culture contaminants. CRITICAL RESULT CALLED TO, READ BACK BY AND VERIFIED WITH: SAM COBLE RN @1221  06/08/20 EB    Staphylococcus lugdunensis NOT DETECTED NOT DETECTED Final   Streptococcus species NOT DETECTED NOT DETECTED Final   Streptococcus agalactiae NOT DETECTED NOT DETECTED Final   Streptococcus pneumoniae NOT DETECTED NOT DETECTED Final   Streptococcus pyogenes NOT DETECTED NOT DETECTED Final   A.calcoaceticus-baumannii NOT DETECTED NOT DETECTED Final   Bacteroides fragilis NOT DETECTED NOT DETECTED Final   Enterobacterales NOT DETECTED NOT DETECTED Final   Enterobacter cloacae complex NOT DETECTED NOT DETECTED Final   Escherichia coli NOT DETECTED NOT DETECTED Final   Klebsiella  aerogenes NOT DETECTED NOT DETECTED Final   Klebsiella oxytoca NOT DETECTED NOT DETECTED Final   Klebsiella pneumoniae NOT DETECTED NOT DETECTED Final   Proteus species NOT DETECTED NOT DETECTED Final   Salmonella  species NOT DETECTED NOT DETECTED Final   Serratia marcescens NOT DETECTED NOT DETECTED Final   Haemophilus influenzae NOT DETECTED NOT DETECTED Final   Neisseria meningitidis NOT DETECTED NOT DETECTED Final   Pseudomonas aeruginosa NOT DETECTED NOT DETECTED Final   Stenotrophomonas maltophilia NOT DETECTED NOT DETECTED Final   Candida albicans NOT DETECTED NOT DETECTED Final   Candida auris NOT DETECTED NOT DETECTED Final   Candida glabrata NOT DETECTED NOT DETECTED Final   Candida krusei NOT DETECTED NOT DETECTED Final   Candida parapsilosis NOT DETECTED NOT DETECTED Final   Candida tropicalis NOT DETECTED NOT DETECTED Final   Cryptococcus neoformans/gattii NOT DETECTED NOT DETECTED Final   Methicillin resistance mecA/C DETECTED (A) NOT DETECTED Final    Comment: CRITICAL RESULT CALLED TO, READ BACK BY AND VERIFIED WITH: SAM COBLE RN @1221  06/08/20 EB Performed at [facility], [address], [place], [place] [address]   Resp Panel by RT-PCR (Flu A&B, Covid) Nasopharyngeal Swab     Status: None   Collection Time: 06/07/20 12:08 PM   Specimen: Nasopharyngeal Swab; Nasopharyngeal(NP) swabs in vial transport medium  Result Value Ref Range Status   SARS Coronavirus 2 by RT PCR NEGATIVE NEGATIVE Final    Comment: (NOTE) SARS-CoV-2 target nucleic acids are NOT DETECTED.  The SARS-CoV-2 RNA is generally detectable in upper respiratory specimens during the acute phase of infection. The lowest concentration of SARS-CoV-2 viral copies this assay can detect is 138 copies/mL. A negative result does not preclude SARS-Cov-2 infection and should not be used as the sole basis for treatment or other patient management decisions. A negative result may occur with  improper specimen  collection/handling, submission of specimen other than nasopharyngeal swab, presence of viral mutation(s) within the areas targeted by this assay, and inadequate number of viral copies(<138 copies/mL). A negative result must be combined with clinical observations, patient history, and epidemiological information. The expected result is Negative.  Fact Sheet for Patients:  EntrepreneurPulse.com.au  Fact Sheet for Healthcare Providers:  IncredibleEmployment.be  This test is no t yet approved or cleared by the [place] FDA and  has been authorized for detection and/or diagnosis of SARS-CoV-2 by FDA under an Emergency Use Authorization (EUA). This EUA will remain  in effect (meaning this test can be used) for the duration of the COVID-19 declaration under Section 564(b)(1) of the Act, 21 U.S.C.section 360bbb-3(b)(1), unless the authorization is terminated  or revoked sooner.       Influenza A by PCR NEGATIVE NEGATIVE Final   Influenza B by PCR NEGATIVE NEGATIVE Final    Comment: (NOTE) The Xpert Xpress SARS-CoV-2/FLU/RSV plus assay is intended as an aid in the diagnosis of influenza from Nasopharyngeal swab specimens and should not be used as a sole basis for treatment. Nasal washings and aspirates are unacceptable for Xpert Xpress SARS-CoV-2/FLU/RSV testing.  Fact Sheet for Patients: EntrepreneurPulse.com.au  Fact Sheet for Healthcare Providers: IncredibleEmployment.be  This test is not yet approved or cleared by the [place] FDA and has been authorized for detection and/or diagnosis of SARS-CoV-2 by FDA under an Emergency Use Authorization (EUA). This EUA will remain in effect (meaning this test can be used) for the duration of the COVID-19 declaration under Section 564(b)(1) of the Act, 21 U.S.C. section 360bbb-3(b)(1), unless the authorization is terminated or revoked.  Performed at [facility], [place] [address], [place], [place] [address]   Urine culture     Status: None   Collection Time: 06/07/20  1:34  PM   Specimen: In/Out Cath Urine  Result Value Ref Range Status   Specimen Description   Final    IN/OUT CATH URINE Performed at [facility], [place] [address], [place], [place] [address]    Special Requests   Final    NONE Performed at [facility], [place] [address], [place], [place] [address]    Culture   Final    NO GROWTH Performed at [facility], [place] [address] [place], [place] [address]    Report Status 06/08/2020 FINAL  Final         Radiology Studies: CT Abdomen Pelvis W Contrast  Addendum Date: 06/07/2020   ADDENDUM REPORT: 06/07/2020 17:27 ADDENDUM: Thrombus in a branch of the SMV in the RIGHT lower quadrant is mildly expansile and may reflect tumor thrombus in this area. There are collateral pathways through the mesentery. The vessel upstream from this is patent. This was likely present previously. Attention on follow-up is suggested. Would also correlate with any worsening RIGHT lower quadrant pain. These results were called by telephone at the time of interpretation on 06/07/2020 at 5:25 pm to provider Dr. Ronnald Nian, Who verbally acknowledged these results. Electronically Signed   By: Zetta Bills M.D.   On: 06/07/2020 17:27   Result Date: 06/07/2020 CLINICAL DATA:  Abdominal abscess, infection suspected. Currently on immunotherapy with nausea vomiting and fever. History of RIGHT renal cell carcinoma. EXAM: CT ABDOMEN AND PELVIS WITH CONTRAST TECHNIQUE: Multidetector CT imaging of the abdomen and pelvis was performed using the standard protocol following bolus administration of intravenous contrast. CONTRAST:  167mL OMNIPAQUE IOHEXOL 300 MG/ML  SOLN COMPARISON:  Prior imaging from May 04, 2020. FINDINGS: Lower chest: Large LEFT lower lobe mass 3.9 x 2.8 cm previously approximately 4.3 x 3.2 cm.  Medial lung base on the LEFT 3 cm compared to 3.3 cm mass. Medial lung base on the RIGHT (image 7/4) 2.8 cm RIGHT lower lobe mass previously approximately 2.4 cm greatest axial dimension. Other masses show a similar appearance to the prior study obtain just a short time ago. No effusion. No consolidation. Suspected RIGHT lower lobe pulmonary embolism (image 89/5) potential embolism also on image 95/5 in the LEFT lower lobe Hepatobiliary: Thrombus extending directly into the IVC from a large mass in the RIGHT liver and from the RIGHT renal vein, extending just above the IVC confluence. Grossly similar to the prior study. Large RIGHT hepatic lobe mass with heterogeneous enhancement measuring 12 cm greatest axial dimension extending from the RIGHT renal mass. Grossly similar. Anterior RIGHT hepatic lobe mass (image 10/2 6.5 as compared to 6.3 cm. The show heterogeneous and peripheral enhancement. New lesion along the anterior surface of the medial segment LEFT hepatic lobe (image 28/2) 17 mm. New lesion on image 32/2 in hepatic subsegment V 16 mm. Enlarging lesions in the RIGHT hepatic lobe in hepatic subsegment VI (image 43/2) subcentimeter area on the prior study as large is 2.9 cm on today's study. Lesion along the inferior RIGHT hemi liver 4.3 cm as compared to 3.9 cm (image 51/2) Marked enlargement of a lesion along the inferior LEFT hemi liver medial segment (image 41/25.9 cm as compared to 2.7 cm. Pancreas: Normal, without mass, inflammation or ductal dilatation. Spleen: Normal spleen. Adrenals/Urinary Tract: Adrenal glands with obscured LEFT adrenal secondary to large mass potentially involved. The LEFT adrenal is normal. The LEFT kidney is normal. Urinary bladder with smooth contour. RIGHT kidney with infiltrative mass contiguous with large mass in the  RIGHT hemi liver. Invasion of RIGHT renal vein. Extension into IVC and via RIGHT hepatic vein from the intrahepatic portion of the mass. Stomach/Bowel: Stomach  is unremarkable. Small bowel without dilation or adjacent stranding. Proximal colon is collapsed. Appendix not visualized. Stool in the distal colon. No adjacent stranding. No perianal stranding. Appendix appears to turn medially away from soft tissue in the pelvis and is not inflamed, no significant change in the appearance of this area since previous imaging Vascular/Lymphatic: Enlarging intra-aortocaval adenopathy in keeping with enlargement of disease elsewhere. 1.8 cm intra-aortocaval lymph node previously 1.5 cm. Increase in general and soft tissue in the intra-aortocaval groove. (Image 38/2) Scattered smaller lymph nodes throughout the retroperitoneum more conspicuous than on previous imaging. No pelvic adenopathy but with retroperitoneal soft tissue in the RIGHT retroperitoneum measuring 4.0 x 1.2 cm previously 4.4 x 1.5 cm. Aortic atherosclerosis. No aneurysmal dilation. Patent abdominal vessels. Reproductive: Post hysterectomy. Other: Small volume ascites in the pelvis.  No free air. Musculoskeletal: Spinal degenerative changes. No acute or destructive bone finding. IMPRESSION: 1. Enlarging hepatic lesions and new hepatic lesions with enlarging upper abdominal adenopathy as described. 2. Signs of pulmonary emboli, again noted in RIGHT lower lobe and perhaps with small new thrombus in LEFT lower lobe, difficult to assess on venous phase. 3. Signs of tumor and likely bland thrombus extending into IVC and RIGHT hepatic vein as well as into the inferior vena cava just above the RIGHT hemidiaphragm. 4. Mixed response with respect to pulmonary findings. 5. Small volume ascites in the pelvis. 6. Aortic atherosclerosis. These results were called by telephone at the time of interpretation on 06/07/2020 at 3:43 pm to provider [facility] , who verbally acknowledged these results. Electronically Signed: By: Zetta Bills M.D. On: 06/07/2020 15:43   DG Chest Port 1 View  Result Date: 06/07/2020 CLINICAL DATA:   Lung carcinoma with fever EXAM: PORTABLE CHEST 1 VIEW COMPARISON:  Chest CT May 04, 2020 FINDINGS: Multiple nodular opacities again noted throughout the lungs bilaterally. Largest nodular opacities in left lower lobe with largest nodular opacities measuring 4.1 x 2.8 cm and 4.3 x 3.2 cm. No edema or airspace opacity. Heart size and pulmonary vascular normal. No adenopathy evident by radiography. No bone lesions. 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 No edema or consolidation. Heart size normal. Electronically Signed   By: Lowella Grip III M.D.   On: 06/07/2020 12:18   Scheduled Meds: . apixaban  5 mg Oral BID  . fenofibrate  54 mg Oral Daily   Continuous Infusions: . vancomycin       LOS: 1 day   Time spent: 59min  Marijayne Rauth C Naheim Burgen, DO [facility]  If 7PM-7AM, please contact night-coverage [email or web address]  06/09/2020, 7:36 AM

## 2020-06-09 NOTE — Progress Notes (Signed)
  Echocardiogram 2D Echocardiogram has been performed.  [name] 06/09/2020, 9:51 AM

## 2020-06-09 NOTE — ED Notes (Signed)
Dr. Ileene Musa notified of patients temperature.

## 2020-06-09 NOTE — Progress Notes (Signed)
hgb from 9.6 to 7.7. NS at 75

## 2020-06-09 NOTE — Plan of Care (Signed)
Patient remains very weak, becomes very shaky with elevated heart rate with any exertion.  Medicated for back pain x 2 on 7 a to 7 p shift.  Daughter and husband at bedside.

## 2020-06-10 ENCOUNTER — Inpatient Hospital Stay (HOSPITAL_COMMUNITY): Payer: BC Managed Care – PPO

## 2020-06-10 DIAGNOSIS — R7881 Bacteremia: Secondary | ICD-10-CM | POA: Diagnosis not present

## 2020-06-10 LAB — CULTURE, BLOOD (ROUTINE X 2): Special Requests: ADEQUATE

## 2020-06-10 LAB — BASIC METABOLIC PANEL
Anion gap: 9 (ref 5–15)
BUN: 13 mg/dL (ref 8–23)
CO2: 23 mmol/L (ref 22–32)
Calcium: 8.4 mg/dL — ABNORMAL LOW (ref 8.9–10.3)
Chloride: 102 mmol/L (ref 98–111)
Creatinine, Ser: 0.77 mg/dL (ref 0.44–1.00)
GFR, Estimated: >60 mL/min (ref >60)
Glucose, Bld: 106 mg/dL — ABNORMAL HIGH (ref 70–99)
Potassium: 3.9 mmol/L (ref 3.5–5.1)
Sodium: 134 mmol/L — ABNORMAL LOW (ref 135–145)

## 2020-06-10 LAB — CBC
HCT: 29.8 % — ABNORMAL LOW (ref 36.0–46.0)
Hemoglobin: 8.7 g/dL — ABNORMAL LOW (ref 12.0–15.0)
MCH: 24 pg — ABNORMAL LOW (ref 26.0–34.0)
MCHC: 29.2 g/dL — ABNORMAL LOW (ref 30.0–36.0)
MCV: 82.3 fL (ref 80.0–100.0)
Platelets: 296 10*3/uL (ref 150–400)
RBC: 3.62 MIL/uL — ABNORMAL LOW (ref 3.87–5.11)
RDW: 15.2 % (ref 11.5–15.5)
WBC: 10.9 10*3/uL — ABNORMAL HIGH (ref 4.0–10.5)
nRBC: 0 % (ref 0.0–0.2)

## 2020-06-10 LAB — MAGNESIUM: Magnesium: 1.6 mg/dL — ABNORMAL LOW (ref 1.7–2.4)

## 2020-06-10 LAB — BRAIN NATRIURETIC PEPTIDE: B Natriuretic Peptide: 183 pg/mL — ABNORMAL HIGH (ref 0.0–100.0)

## 2020-06-10 MED ORDER — ENSURE ENLIVE PO LIQD
237.0000 mL | Freq: Two times a day (BID) | ORAL | Status: DC
  Administered 2020-06-11 – 2020-06-12 (×3): 237 mL via ORAL

## 2020-06-10 MED ORDER — ADULT MULTIVITAMIN W/MINERALS CH
1.0000 | ORAL_TABLET | Freq: Every day | ORAL | Status: DC
  Administered 2020-06-10 – 2020-06-12 (×3): 1 via ORAL
  Filled 2020-06-10 (×3): qty 1

## 2020-06-10 MED ORDER — MAGNESIUM SULFATE 2 GM/50ML IV SOLN
2.0000 g | Freq: Once | INTRAVENOUS | Status: AC
  Administered 2020-06-10: 2 g via INTRAVENOUS
  Filled 2020-06-10: qty 50

## 2020-06-10 MED ORDER — ALPRAZOLAM 0.25 MG PO TABS
0.2500 mg | ORAL_TABLET | Freq: Once | ORAL | Status: AC
  Administered 2020-06-10: 0.25 mg via ORAL
  Filled 2020-06-10: qty 1

## 2020-06-10 NOTE — Plan of Care (Signed)
Patient reports feeling better today, no fever on 7 a to 7 p shift. Up ambulatory in the room, much less tremulous today with exertion.  Son and husband at bedside.

## 2020-06-10 NOTE — Progress Notes (Signed)
PROGRESS NOTE    [name]  PPI:[id number] DOB: March 20, 1960 DOA: 06/08/2020 PCP: Josetta Huddle, MD   Brief Narrative:  [name] is a 60 y.o. female with medical history significant for renal cell carcinoma with metastasis, PE on Eliquis (dx 05/04/20), CVA, hypertension, hyperlipidemia and migraine who presents to the ED for a call back for positive staph culture. Patient was evaluated in the ED yesterday for fever, nausea and vomiting the day following her first immunotherapy infusion on Monday for renal cell carcinoma.  Fever of up to 103.  She had mild leukocytosis at 11.8 and negative CT abdomen.  She was also evaluated bedside by oncology and thought fever could be viral. She was discharged home with follow-up outpatient.  However both sets of blood cultures grew MRSA and she received call back to return back to ER today. Other than nausea and vomiting being new.  Patient continues to have her usual right-sided abdominal pain from her cancer.  CT abdomen and pelvis yesterday with no new acute findings.  Also notes a cough for the past several weeks without fever.  No new worsening shortness of breath.  She has metastasis foci in the lung bilaterally.  Denies any dysuria.  She had a UA yesterday with negative urine culture.  She does not have any other ports or IV lines. Hospitalist called for admission, ID to follow along per protocol.  Assessment & Plan:   Principal Problem:   Bacteremia Active Problems:   Metastatic renal cell carcinoma (HCC)   Pulmonary embolism (HCC)   Hyponatremia   Hyperkalemia   HTN (hypertension)   Pressure injury of skin   Sepsis secondary to staph bacteremia, POA - Pt with fever, nausea and vomiting following first session of immunotherapy infusion Monday - Blood culture positive x2 for MRSA althought Staph epidermitis also seen - ID to follow per protocol - unclear source- had had cough for weeks but no fever at that time or increasing shortness of  breath to suggest pneumonia- will obtain sputum culture - continue IV Vanc - Repeat blood cultures prelim negative - Echo without any valvular abnormalities - will follow up with ID - patient is refusing TEE which we discussed at length would need to confirm/clear her infectious status. Given her advanced RCC with mets might be more conservative to treat as presumptive endocarditis for 6 weeks; or simply to treat for 2 weeks and repeat cultures after abx have been completed. Will discuss with ID once we get micro/speciation/sensitivities back  Hyponatremia, hypovolemic -  Likely secondary to N/V prior to admission -  Follow with morning labs with continuous IV fluids  Mild hyperkalemia, resolved -follow with repeat labs after fluids  Renal cell carcinoma 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 Rob Hickman note prior to admit - pt can hold axitinib if unable to tolerate PO  PE on Eliquis - Recently diagnosed - likely secondary to cancer history - first noted on imaging 05/04/20  - continue Eliquis   HTN hold lisinopril-HCTZ due to hyponatremia   DVT prophylaxis: Eliquis Code Status: DNR Family Communication: None present  Status is: Inpt  Dispo: The patient is from: Home              Anticipated d/c is to: TBD              Anticipated d/c date is: 48-72h              Patient currently NOT  medically stable for discharge - still requiring further workup/imaging and IV antibiotics  Consultants:   ID, HemeOnc  Procedures:   None  Antimicrobials:  Vancomycin 5/6 - ongoing   Subjective: No acute issues/events overnight, N/V resolved - poor PO intake and weakness/fatigue ongoing.  Objective: Vitals:   06/09/20 2224 06/10/20 0242 06/10/20 0306 06/10/20 0611  BP: 128/67 (!) 181/74 (!) 166/76 (!) 147/65  Pulse: 92     Resp: 20   18  Temp: 99.2 F (37.3 C) 97.7 F (36.5 C)  (!) 102.6 F (39.2 C)  TempSrc: Oral Oral  Oral  SpO2: 94% 92%  91%   Weight:      Height:        Intake/Output Summary (Last 24 hours) at 06/10/2020 0742 Last data filed at 06/10/2020 0200 Gross per 24 hour  Intake 878.75 ml  Output 1650 ml  Net -771.25 ml   Filed Weights   06/08/20 1547 06/09/20 0207 06/09/20 0207  Weight: 56.2 kg 54.9 kg 54.9 kg    Examination:  General exam: Appears calm and comfortable  Respiratory system: Clear to auscultation. Respiratory effort normal. Cardiovascular system: S1 & S2 heard, RRR. No JVD, murmurs, rubs, gallops or clicks. No pedal edema. Gastrointestinal system: Abdomen is nondistended, soft and nontender. No organomegaly or masses felt. Normal bowel sounds heard. Central nervous system: Alert and oriented. No focal neurological deficits. Extremities: Symmetric 5 x 5 power. Skin: No rashes, lesions or ulcers Psychiatry: Judgement and insight appear normal. Mood & affect appropriate.     Data Reviewed: I have personally reviewed following labs and imaging studies  CBC: Recent Labs  Lab 06/04/20 1303 06/07/20 1110 06/08/20 1609 06/08/20 1634 06/09/20 0341 06/10/20 0334  WBC 6.9 11.8* 13.0*  --  11.2* 10.9*  NEUTROABS 4.6 9.9* 9.9*  --   --   --   HGB 8.3* 9.0* 9.6* 10.2* 7.7* 8.7*  HCT 27.3* 29.5* 32.2* 30.0* 25.7* 29.8*  MCV 79.8* 78.5* 81.1  --  80.1 82.3  PLT 334 278 298  --  260 468   Basic Metabolic Panel: Recent Labs  Lab 06/04/20 1303 06/07/20 1110 06/08/20 1634 06/08/20 2357 06/09/20 0341 06/10/20 0334 06/10/20 0401  NA 130* 130* 129* 133* 134* 134*  --   K 4.2 3.7 5.2* 4.3 3.8 3.9  --   CL 97* 98 96* 101 103 102  --   CO2 25 23  --  23 22 23   --   GLUCOSE 238* 161* 108* 99 87 106*  --   BUN 15 16 19 14 14 13   --   CREATININE 0.87 0.83 0.90 0.91 0.97 0.77  --   CALCIUM 11.2* 9.2  --  8.7* 8.5* 8.4*  --   MG  --   --   --   --   --   --  1.6*   GFR: Estimated Creatinine Clearance: 63 mL/min (by C-G formula based on SCr of 0.77 mg/dL). Liver Function Tests: Recent Labs  Lab  06/04/20 1303 06/07/20 1110 06/09/20 0341  AST 60* 99* 72*  ALT 19 28 25   ALKPHOS 97 93 79  BILITOT 0.3 0.6 0.8  PROT 6.9 6.7 5.9*  ALBUMIN 2.8* 2.9* 2.4*   No results for input(s): LIPASE, AMYLASE in the last 168 hours. No results for input(s): AMMONIA in the last 168 hours. Coagulation Profile: Recent Labs  Lab 06/07/20 1110  INR 2.2*   Cardiac Enzymes: No results for input(s): CKTOTAL, CKMB, CKMBINDEX, TROPONINI in the last 168  hours. BNP (last 3 results) No results for input(s): PROBNP in the last 8760 hours. HbA1C: No results for input(s): HGBA1C in the last 72 hours. CBG: No results for input(s): GLUCAP in the last 168 hours. Lipid Profile: No results for input(s): CHOL, HDL, LDLCALC, TRIG, CHOLHDL, LDLDIRECT in the last 72 hours. Thyroid Function Tests: No results for input(s): TSH, T4TOTAL, FREET4, T3FREE, THYROIDAB in the last 72 hours. Anemia Panel: No results for input(s): VITAMINB12, FOLATE, FERRITIN, TIBC, IRON, RETICCTPCT in the last 72 hours. Sepsis Labs: Recent Labs  Lab 06/07/20 1110 06/07/20 1324 06/08/20 1609 06/08/20 1809  LATICACIDVEN 1.5 1.7 2.0* 2.0*    Recent Results (from the past 240 hour(s))  Blood Culture (routine x 2)     Status: Abnormal (Preliminary result)   Collection Time: 06/07/20 11:10 AM   Specimen: BLOOD  Result Value Ref Range Status   Specimen Description   Final    BLOOD LEFT ANTECUBITAL Performed at [facility], [place] [address], [place], [place] [address]    Special Requests   Final    BOTTLES DRAWN AEROBIC AND ANAEROBIC Blood Culture adequate volume Performed at [place] [address], [place], [place] [address]    Culture  Setup Time   Final    GRAM POSITIVE COCCI AEROBIC BOTTLE ONLY CRITICAL VALUE NOTED.  VALUE IS CONSISTENT WITH PREVIOUSLY REPORTED AND CALLED VALUE. Performed at [facility], [place] [address], [place], [place] [address]    Culture STAPHYLOCOCCUS EPIDERMIDIS  (A)  Final   Report Status PENDING  Incomplete  Blood Culture (routine x 2)     Status: Abnormal (Preliminary result)   Collection Time: 06/07/20 11:10 AM   Specimen: BLOOD  Result Value Ref Range Status   Specimen Description   Final    BLOOD RIGHT ANTECUBITAL Performed at [facility], [place] [address] [place], [place] [address]    Special Requests   Final    BOTTLES DRAWN AEROBIC AND ANAEROBIC Blood Culture adequate volume Performed at [place] [address], [place], [place] [address]    Culture  Setup Time   Final    GRAM POSITIVE COCCI IN BOTH AEROBIC AND ANAEROBIC BOTTLES CRITICAL RESULT CALLED TO, READ BACK BY AND VERIFIED WITH: SAM COBLE RN @1221  06/08/20 EB    Culture (A)  Final    STAPHYLOCOCCUS EPIDERMIDIS STAPHYLOCOCCUS HOMINIS SUSCEPTIBILITIES TO FOLLOW Performed at [facility], [place] [address], [place],  [address]    Report Status PENDING  Incomplete  Blood Culture ID Panel (Reflexed)     Status: Abnormal   Collection Time: 06/07/20 11:10 AM  Result Value Ref Range Status   Enterococcus faecalis NOT DETECTED NOT DETECTED Final   Enterococcus Faecium NOT DETECTED NOT DETECTED Final   Listeria monocytogenes NOT DETECTED NOT DETECTED Final   Staphylococcus species DETECTED (A) NOT DETECTED Final    Comment: CRITICAL RESULT CALLED TO, READ BACK BY AND VERIFIED WITH: SAM COBLE RN @1221  06/08/20 EB    Staphylococcus aureus (BCID) NOT DETECTED NOT DETECTED Final   Staphylococcus epidermidis DETECTED (A) NOT DETECTED Final    Comment: Methicillin (oxacillin) resistant coagulase negative staphylococcus. Possible blood culture contaminant (unless isolated from more than one blood culture draw or clinical case suggests pathogenicity). No antibiotic treatment is indicated for blood  culture contaminants. CRITICAL RESULT CALLED TO, READ BACK BY AND VERIFIED WITH: SAM COBLE RN @1221  06/08/20 EB    Staphylococcus lugdunensis NOT  DETECTED NOT  DETECTED Final   Streptococcus species NOT DETECTED NOT DETECTED Final   Streptococcus agalactiae NOT DETECTED NOT DETECTED Final   Streptococcus pneumoniae NOT DETECTED NOT DETECTED Final   Streptococcus pyogenes NOT DETECTED NOT DETECTED Final   A.calcoaceticus-baumannii NOT DETECTED NOT DETECTED Final   Bacteroides fragilis NOT DETECTED NOT DETECTED Final   Enterobacterales NOT DETECTED NOT DETECTED Final   Enterobacter cloacae complex NOT DETECTED NOT DETECTED Final   Escherichia coli NOT DETECTED NOT DETECTED Final   Klebsiella aerogenes NOT DETECTED NOT DETECTED Final   Klebsiella oxytoca NOT DETECTED NOT DETECTED Final   Klebsiella pneumoniae NOT DETECTED NOT DETECTED Final   Proteus species NOT DETECTED NOT DETECTED Final   Salmonella species NOT DETECTED NOT DETECTED Final   Serratia marcescens NOT DETECTED NOT DETECTED Final   Haemophilus influenzae NOT DETECTED NOT DETECTED Final   Neisseria meningitidis NOT DETECTED NOT DETECTED Final   Pseudomonas aeruginosa NOT DETECTED NOT DETECTED Final   Stenotrophomonas maltophilia NOT DETECTED NOT DETECTED Final   Candida albicans NOT DETECTED NOT DETECTED Final   Candida auris NOT DETECTED NOT DETECTED Final   Candida glabrata NOT DETECTED NOT DETECTED Final   Candida krusei NOT DETECTED NOT DETECTED Final   Candida parapsilosis NOT DETECTED NOT DETECTED Final   Candida tropicalis NOT DETECTED NOT DETECTED Final   Cryptococcus neoformans/gattii NOT DETECTED NOT DETECTED Final   Methicillin resistance mecA/C DETECTED (A) NOT DETECTED Final    Comment: CRITICAL RESULT CALLED TO, READ BACK BY AND VERIFIED WITH: SAM COBLE RN @1221  06/08/20 EB Performed at [facility], [address] [place], [place] [address]   Resp Panel by RT-PCR (Flu A&B, Covid) Nasopharyngeal Swab     Status: None   Collection Time: 06/07/20 12:08 PM   Specimen: Nasopharyngeal Swab; Nasopharyngeal(NP) swabs in vial transport medium  Result  Value Ref Range Status   SARS Coronavirus 2 by RT PCR NEGATIVE NEGATIVE Final    Comment: (NOTE) SARS-CoV-2 target nucleic acids are NOT DETECTED.  The SARS-CoV-2 RNA is generally detectable in upper respiratory specimens during the acute phase of infection. The lowest concentration of SARS-CoV-2 viral copies this assay can detect is 138 copies/mL. A negative result does not preclude SARS-Cov-2 infection and should not be used as the sole basis for treatment or other patient management decisions. A negative result may occur with  improper specimen collection/handling, submission of specimen other than nasopharyngeal swab, presence of viral mutation(s) within the areas targeted by this assay, and inadequate number of viral copies(<138 copies/mL). A negative result must be combined with clinical observations, patient history, and epidemiological information. The expected result is Negative.  Fact Sheet for Patients:  EntrepreneurPulse.com.au  Fact Sheet for Healthcare Providers:  IncredibleEmployment.be  This test is no t yet approved or cleared by the [place] FDA and  has been authorized for detection and/or diagnosis of SARS-CoV-2 by FDA under an Emergency Use Authorization (EUA). This EUA will remain  in effect (meaning this test can be used) for the duration of the COVID-19 declaration under Section 564(b)(1) of the Act, 21 U.S.C.section 360bbb-3(b)(1), unless the authorization is terminated  or revoked sooner.       Influenza A by PCR NEGATIVE NEGATIVE Final   Influenza B by PCR NEGATIVE NEGATIVE Final    Comment: (NOTE) The Xpert Xpress SARS-CoV-2/FLU/RSV plus assay is intended as an aid in the diagnosis of influenza from Nasopharyngeal swab specimens and should not be used as a sole basis for treatment. Nasal washings and  aspirates are unacceptable for Xpert Xpress SARS-CoV-2/FLU/RSV testing.  Fact Sheet for  Patients: EntrepreneurPulse.com.au  Fact Sheet for Healthcare Providers: IncredibleEmployment.be  This test is not yet approved or cleared by the [place] FDA and has been authorized for detection and/or diagnosis of SARS-CoV-2 by FDA under an Emergency Use Authorization (EUA). This EUA will remain in effect (meaning this test can be used) for the duration of the COVID-19 declaration under Section 564(b)(1) of the Act, 21 U.S.C. section 360bbb-3(b)(1), unless the authorization is terminated or revoked.  Performed at [facility], [place] [address] [place], [place] [address]   Urine culture     Status: None   Collection Time: 06/07/20  1:34 PM   Specimen: In/Out Cath Urine  Result Value Ref Range Status   Specimen Description   Final    IN/OUT CATH URINE Performed at [place] [address], [place], [place] [address]    Special Requests   Final    NONE Performed at [facility], [place] [address], [place], [place] [address]    Culture   Final    NO GROWTH Performed at [facility], [place] [address], [place], [place] [address]    Report Status 06/08/2020 FINAL  Final  Blood culture (routine x 2)     Status: None (Preliminary result)   Collection Time: 06/08/20  4:09 PM   Specimen: BLOOD LEFT FOREARM  Result Value Ref Range Status   Specimen Description   Final    BLOOD LEFT FOREARM Performed at [place] [address], [place], [place] [address]    Special Requests   Final    BOTTLES DRAWN AEROBIC AND ANAEROBIC Blood Culture results may not be optimal due to an inadequate volume of blood received in culture bottles Performed at [place] [address] [place], [place] [address]    Culture   Final    NO GROWTH < 24 HOURS Performed at [place] [address], [place], [place] [address]    Report Status PENDING   Incomplete  Blood culture (routine x 2)     Status: None (Preliminary result)   Collection Time: 06/08/20  4:14 PM   Specimen: BLOOD RIGHT FOREARM  Result Value Ref Range Status   Specimen Description   Final    BLOOD RIGHT FOREARM Performed at [place] [address] [place], [place] [address]    Special Requests   Final    BOTTLES DRAWN AEROBIC ONLY Blood Culture results may not be optimal due to an inadequate volume of blood received in culture bottles Performed at [place] [address] [place], [place] [address]    Culture   Final    NO GROWTH < 24 HOURS Performed at [place] [address] [place], [place] [address]    Report Status PENDING  Incomplete         Radiology Studies: DG CHEST PORT 1 VIEW  Result Date: 06/10/2020 CLINICAL DATA:  Chest where venous distension. EXAM: PORTABLE CHEST 1 VIEW COMPARISON:  CT chest 05/04/2020 and CT abdomen pelvis 06/07/2020. Chest x-ray 06/07/2020 FINDINGS: No new enlarged cardiac silhouette. The heart size and mediastinal contours are unchanged. Similar peripheral right lower lobe 4 cm opacity consistent with known metastases that are better evaluated on CT chest 05/04/2020 and CT abdomen pelvis 06/07/2020. Bibasilar streaky airspace opacities. No large apical mass. No pulmonary edema. No  pleural effusion. No pneumothorax. No acute osseous abnormality. IMPRESSION: No acute cardiopulmonary abnormality in a patient with known pulmonary metastases. Electronically Signed   By: Iven Finn M.D.   On: 06/10/2020 04:26   ECHOCARDIOGRAM COMPLETE  Result Date: 06/09/2020    ECHOCARDIOGRAM REPORT   Patient Name:   HURLEY BLEVINS Peine Date of Exam: 06/09/2020 Medical Rec #:  [id number]       Height:       64.0 in Accession #:    [id number]      Weight:       121.0 lb Date of Birth:  1960-11-28        BSA:          1.580 m Patient Age:    12 years        BP:           162/74 mmHg Patient Gender:  F               HR:           113 bpm. Exam Location:  Inpatient Procedure: 2D Echo, 3D Echo, Cardiac Doppler and Color Doppler Indications:    R01.1 Murmur  History:        Patient has no prior history of Echocardiogram examinations.                 Abnormal ECG, Stroke, Signs/Symptoms:Bacteremia; Risk                 Factors:Hypertension. Cancer. Pulmonary embolus.  Sonographer:    Roseanna Rainbow RDCS Referring Phys: [id number] [place] T TU  Sonographer Comments: Technically difficult study due to poor echo windows, suboptimal parasternal window and suboptimal apical window. IMPRESSIONS  1. Left ventricular ejection fraction, by estimation, is 60 to 65%. Left ventricular ejection fraction by 3D volume is 59 %. The left ventricle has normal function. The left ventricle has no regional wall motion abnormalities. There is mild concentric left ventricular hypertrophy. Indeterminate diastolic filling due to E-A fusion.  2. Right ventricular systolic function is normal. The right ventricular size is normal. There is mildly elevated pulmonary artery systolic pressure.  3. Left atrial size was mildly dilated.  4. The mitral valve is normal in structure. Trivial mitral valve regurgitation. No evidence of mitral stenosis.  5. The aortic valve is normal in structure. Aortic valve regurgitation is not visualized. No aortic stenosis is present.  6. There is a mobile, nonobstructive mass in the proximal inferior vena cava, measuring at least 3 cm in length, roughly 1 cm in diameter, may represent a thrombembolus in transit or extension of renal cell carcinoma. The inferior vena cava is dilated in size with <50% respiratory variability, suggesting right atrial pressure of 15 mmHg. Conclusion(s)/Recommendation(s): No evidence of valvular vegetations on this transthoracic echocardiogram. Would recommend a transesophageal echocardiogram to exclude infective endocarditis if clinically indicated. 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 FINDINGS   Left Ventricle: Left ventricular ejection fraction, by estimation, is 60 to 65%. Left ventricular ejection fraction by 3D volume is 59 %. The left ventricle has normal function. The left ventricle has no regional wall motion abnormalities. The left ventricular internal cavity size was normal in size. There is mild concentric left ventricular hypertrophy. Indeterminate diastolic filling due to E-A fusion. Normal left ventricular filling pressure. Right Ventricle: The right ventricular size is normal. No increase in right ventricular wall thickness. Right ventricular systolic function is normal. There is mildly elevated pulmonary artery systolic pressure. The tricuspid regurgitant velocity is  2.59  m/s, and with an assumed right atrial pressure of 15 mmHg, the estimated right ventricular systolic pressure is 23.5 mmHg. Left Atrium: Left atrial size was mildly dilated. Right Atrium: Right atrial size was normal in size. Prominent Eustachian valve. Pericardium: There is no evidence of pericardial effusion. Mitral Valve: The mitral valve is normal in structure. Trivial mitral valve regurgitation. No evidence of mitral valve stenosis. Tricuspid Valve: The tricuspid valve is normal in structure. Tricuspid valve regurgitation is trivial. No evidence of tricuspid stenosis. Aortic Valve: The aortic valve is normal in structure. Aortic valve regurgitation is not visualized. No aortic stenosis is present. Pulmonic Valve: The pulmonic valve was normal in structure. Pulmonic valve regurgitation is not visualized. No evidence of pulmonic stenosis. Aorta: The aortic root is normal in size and structure. Venous: There is a mobile, nonobstructive mass in the proximal inferior vena cava, measuring at least 3 cm in length, roughly 1 cm in diameter, may represent a thrombembolus in transit or extension of renal cell carcinoma. The inferior vena cava is dilated in size with less than 50% respiratory variability, suggesting right atrial  pressure of 15 mmHg. IAS/Shunts: No atrial level shunt detected by color flow Doppler.  LEFT VENTRICLE PLAX 2D LVIDd:         3.60 cm LVIDs:         2.20 cm LV PW:         1.25 cm         3D Volume EF LV IVS:        1.20 cm         LV 3D EF:    Left LVOT diam:     2.00 cm                      ventricular LV SV:         79                           ejection LV SV Index:   50                           fraction by LVOT Area:     3.14 cm                     3D volume                                             is 59 %.  LV Volumes (MOD) LV vol d, MOD    75.9 ml       3D Volume EF: A2C:                           3D EF:        59 % LV vol d, MOD    52.0 ml       LV EDV:       113 ml A4C:                           LV ESV:       46 ml LV vol s, MOD    33.7 ml       LV  SV:        67 ml A2C: LV vol s, MOD    16.2 ml A4C: LV SV MOD A2C:   42.2 ml LV SV MOD A4C:   52.0 ml LV SV MOD BP:    39.6 ml RIGHT VENTRICLE             IVC RV S prime:     15.50 cm/s  IVC diam: 2.70 cm TAPSE (M-mode): 2.2 cm LEFT ATRIUM           Index       RIGHT ATRIUM           Index LA diam:      4.30 cm 2.72 cm/m  RA Area:     10.60 cm LA Vol (A2C): 27.7 ml 17.53 ml/m RA Volume:   19.50 ml  12.34 ml/m LA Vol (A4C): 50.6 ml 32.02 ml/m  AORTIC VALVE LVOT Vmax:   155.00 cm/s LVOT Vmean:  89.300 cm/s LVOT VTI:    0.251 m  AORTA Ao Root diam: 3.10 cm Ao Asc diam:  3.50 cm MITRAL VALVE               TRICUSPID VALVE MV Area (PHT): 9.60 cm    TR Peak grad:   26.8 mmHg MV Decel Time: 79 msec     TR Vmax:        259.00 cm/s MV E velocity: 98.80 cm/s                            SHUNTS                            Systemic VTI:  0.25 m                            Systemic Diam: 2.00 cm Dani Gobble Croitoru MD Electronically signed by Sanda Klein MD Signature Date/Time: 06/09/2020/11:13:00 AM    Final    Scheduled Meds: . apixaban  5 mg Oral BID  . fenofibrate  54 mg Oral Daily  . senna-docusate  1 tablet Oral BID   Continuous Infusions: . vancomycin 1,000 mg  (06/09/20 1016)     LOS: 2 days   Time spent: 7min  Adley Mazurowski C Ewelina Naves, DO [facility]  If 7PM-7AM, please contact night-coverage [email or web address]  06/10/2020, 7:42 AM

## 2020-06-10 NOTE — Progress Notes (Signed)
Initial Nutrition Assessment  DOCUMENTATION CODES:   Not applicable  INTERVENTION:    Ensure Enlive po BID, each supplement provides 350 kcal and 20 grams of protein  MVI daily   NUTRITION DIAGNOSIS:   Increased nutrient needs related to cancer and cancer related treatments as evidenced by estimated needs.  GOAL:   Patient will meet greater than or equal to 90% of their needs  MONITOR:   PO intake,Supplement acceptance,Weight trends,Labs,I & O's  REASON FOR ASSESSMENT:   Malnutrition Screening Tool    ASSESSMENT:   Patient with PMH significant for renal cell carcinoma with metastasis, CVA, HTN, HLD, and PE. Presents this admission with sepsis secondary to staph bacteremia.  Attempted to reach patient x2, no answer. Last two meal completions charted as 50% and 25%. Noted no BM in 4 days, if no result with senokot, may need to strengthen bowel regimen. Will need to obtain further nutrition/weight history if possible. RD to provide supplementation to maximize kcal and protein.   Weight noted to decline from 60 kg on 3/21 to 54.9 kg this admission 8.5% weigh loss in two months, significant for time frame. Suspect malnutrition bu unable to diagnose without NFPE.   Medications: senokot Labs: Na 134 (L) Mg 1.6 (L)  Diet Order:   Diet Order            Diet regular Room service appropriate? Yes; Fluid consistency: Thin  Diet effective now                 EDUCATION NEEDS:   Not appropriate for education at this time  Skin:  Skin Assessment: Skin Integrity Issues: Skin Integrity Issues:: Stage I Stage I: buttocks  Last BM:  5/4  Height:   Ht Readings from Last 1 Encounters:  06/09/20 5\' 4"  (1.626 m)    Weight:   Wt Readings from Last 1 Encounters:  06/09/20 54.9 kg    BMI:  Body mass index is 20.77 kg/m.  Estimated Nutritional Needs:   Kcal:  1700-1900 kcal  Protein:  85-100 grams  Fluid:  >/= 1.7 L/day  Mariana Single RD, LDN Clinical  Nutrition Pager listed in [place]

## 2020-06-11 ENCOUNTER — Telehealth: Payer: Self-pay | Admitting: Emergency Medicine

## 2020-06-11 DIAGNOSIS — R7881 Bacteremia: Secondary | ICD-10-CM | POA: Diagnosis not present

## 2020-06-11 LAB — CBC
HCT: 25.6 % — ABNORMAL LOW (ref 36.0–46.0)
Hemoglobin: 7.6 g/dL — ABNORMAL LOW (ref 12.0–15.0)
MCH: 24.2 pg — ABNORMAL LOW (ref 26.0–34.0)
MCHC: 29.7 g/dL — ABNORMAL LOW (ref 30.0–36.0)
MCV: 81.5 fL (ref 80.0–100.0)
Platelets: 298 10*3/uL (ref 150–400)
RBC: 3.14 MIL/uL — ABNORMAL LOW (ref 3.87–5.11)
RDW: 15.4 % (ref 11.5–15.5)
WBC: 10.2 10*3/uL (ref 4.0–10.5)
nRBC: 0 % (ref 0.0–0.2)

## 2020-06-11 LAB — BASIC METABOLIC PANEL
Anion gap: 7 (ref 5–15)
BUN: 11 mg/dL (ref 8–23)
CO2: 24 mmol/L (ref 22–32)
Calcium: 8.4 mg/dL — ABNORMAL LOW (ref 8.9–10.3)
Chloride: 101 mmol/L (ref 98–111)
Creatinine, Ser: 0.67 mg/dL (ref 0.44–1.00)
GFR, Estimated: >60 mL/min (ref >60)
Glucose, Bld: 103 mg/dL — ABNORMAL HIGH (ref 70–99)
Potassium: 4.2 mmol/L (ref 3.5–5.1)
Sodium: 132 mmol/L — ABNORMAL LOW (ref 135–145)

## 2020-06-11 MED ORDER — POLYETHYLENE GLYCOL 3350 17 G PO PACK
17.0000 g | PACK | Freq: Once | ORAL | Status: AC
  Administered 2020-06-11: 17 g via ORAL
  Filled 2020-06-11: qty 1

## 2020-06-11 NOTE — Progress Notes (Signed)
[name]   DOB:10-05-1960   JH#:[id number]   YJE#:[id number]   Asssessment/Plan  This is a very pleasant 60 year old female patient with metastatic clear-cell renal cell carcinoma, extensive disease who presented to the ER with chief complaint of nausea, fever and worsening right-sided abdominal pain.  Blood cx positive for MRSE.  He had repeat imaging which showed enlarging hepatic lesion, PE, bland and tumor thrombus, small volume ascites She is currently on IV abx, Vancomycin but according to Dr Justus Memory discussion with ID team, she can be discharged on Linezolid PO for 5 days. 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 We will monitor her periodically inpatient, can continue axitinib if she is able to tolerate PO diet, she can bring this from home. Thank you for your assistance in caring for ourpatient . 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  Subjective:   Patient is resting in bed, feeling tired, hoping to get some sleep. She denies any nausea, vomiting. Pain is better controlled. No diarrhea. No dysuria. Eating decently, ate some bacon and eggs today  Objective:  Vitals:   06/10/20 2115 06/11/20 0447  BP:  (!) 149/66  Pulse: (!) 110 (!) 109  Resp: 20 16  Temp: 98.6 F (37 C) (!) 101 F (38.3 C)  SpO2: 97% 92%    Body mass index is 20.77 kg/m.  Intake/Output Summary (Last 24 hours) at 06/11/2020 1235 Last data filed at 06/10/2020 1800 Gross per 24 hour  Intake 1170.01 ml  Output 550 ml  Net 620.01 ml     Sclerae unicteric             Neuro nonfocal             No distress.   CBG (last 3)  No results for input(s): GLUCAP in the last 72 hours.   Labs:  Lab Results  Component Value Date   WBC 10.2 06/11/2020    HGB 7.6 (L) 06/11/2020   HCT 25.6 (L) 06/11/2020   MCV 81.5 06/11/2020   PLT 298 06/11/2020   NEUTROABS 9.9 (H) 06/08/2020     Urine Studies No results for input(s): UHGB, CRYS in the last 72 hours.  Invalid input(s): UACOL, UAPR, USPG, UPH, UTP, UGL, [place], UBIL, UNIT, UROB, [place], UEPI, UWBC, [name] [place], [place], [place]  Basic Metabolic Panel: Recent Labs  Lab 06/07/20 1110 06/08/20 1634 06/08/20 2357 06/09/20 0341 06/10/20 0334 06/10/20 0401 06/11/20 0354  NA 130* 129* 133* 134* 134*  --  132*  K 3.7 5.2* 4.3 3.8 3.9  --  4.2  CL 98 96* 101 103 102  --  101  CO2 23  --  23 22 23   --  24  GLUCOSE 161* 108* 99 87 106*  --  103*  BUN 16 19 14 14 13   --  11  CREATININE 0.83 0.90 0.91 0.97 0.77  --  0.67  CALCIUM 9.2  --  8.7* 8.5* 8.4*  --  8.4*  MG  --   --   --   --   --  1.6*  --    GFR Estimated Creatinine Clearance: 63 mL/min (by C-G formula based on SCr of 0.67 mg/dL). Liver Function Tests: Recent Labs  Lab 06/04/20 1303 06/07/20 1110 06/09/20 0341  AST 60* 99*  72*  ALT 19 28 25   ALKPHOS 97 93 79  BILITOT 0.3 0.6 0.8  PROT 6.9 6.7 5.9*  ALBUMIN 2.8* 2.9* 2.4*   No results for input(s): LIPASE, AMYLASE in the last 168 hours. No results for input(s): AMMONIA in the last 168 hours. Coagulation profile Recent Labs  Lab 06/07/20 1110  INR 2.2*    CBC: Recent Labs  Lab 06/04/20 1303 06/04/20 1303 06/07/20 1110 06/08/20 1609 06/08/20 1634 06/09/20 0341 06/10/20 0334 06/11/20 0354  WBC 6.9  --  11.8* 13.0*  --  11.2* 10.9* 10.2  NEUTROABS 4.6  --  9.9* 9.9*  --   --   --   --   HGB 8.3*   < > 9.0* 9.6* 10.2* 7.7* 8.7* 7.6*  HCT 27.3*  --  29.5* 32.2* 30.0* 25.7* 29.8* 25.6*  MCV 79.8*  --  78.5* 81.1  --  80.1 82.3 81.5  PLT 334  --  278 298  --  260 296 298   < > = values in this interval not displayed.   Cardiac Enzymes: No results for input(s): CKTOTAL, CKMB, CKMBINDEX, TROPONINI in the last 168 hours. BNP: Invalid input(s):  POCBNP CBG: No results for input(s): GLUCAP in the last 168 hours. D-Dimer No results for input(s): DDIMER in the last 72 hours. Hgb A1c No results for input(s): HGBA1C in the last 72 hours. Lipid Profile No results for input(s): CHOL, HDL, LDLCALC, TRIG, CHOLHDL, LDLDIRECT in the last 72 hours. Thyroid function studies No results for input(s): TSH, T4TOTAL, T3FREE, THYROIDAB in the last 72 hours.  Invalid input(s): FREET3 Anemia work up No results for input(s): VITAMINB12, FOLATE, FERRITIN, TIBC, IRON, RETICCTPCT in the last 72 hours. Microbiology Recent Results (from the past 240 hour(s))  Blood Culture (routine x 2)     Status: Abnormal   Collection Time: 06/07/20 11:10 AM   Specimen: BLOOD  Result Value Ref Range Status   Specimen Description   Final    BLOOD LEFT ANTECUBITAL Performed at [facility], [place] [address] [place], [place] [address]    Special Requests   Final    BOTTLES DRAWN AEROBIC AND ANAEROBIC Blood Culture adequate volume Performed at [facility] [address], [place], [place] [address]    Culture  Setup Time   Final    GRAM POSITIVE COCCI AEROBIC BOTTLE ONLY CRITICAL VALUE NOTED.  VALUE IS CONSISTENT WITH PREVIOUSLY REPORTED AND CALLED VALUE.    Culture (A)  Final    STAPHYLOCOCCUS EPIDERMIDIS SUSCEPTIBILITIES PERFORMED ON PREVIOUS CULTURE WITHIN THE LAST 5 DAYS. Performed at [facility], [place] [address] [place], [place] [address]    Report Status 06/10/2020 FINAL  Final  Blood Culture (routine x 2)     Status: Abnormal (Preliminary result)   Collection Time: 06/07/20 11:10 AM   Specimen: BLOOD  Result Value Ref Range Status   Specimen Description   Final    BLOOD RIGHT ANTECUBITAL Performed at [facility], [place] [address], [place], [place] [address]    Special Requests   Final    BOTTLES DRAWN AEROBIC AND ANAEROBIC Blood Culture adequate volume Performed at [facility]  [address] [place], [place] [address]    Culture  Setup Time   Final    GRAM POSITIVE COCCI IN BOTH AEROBIC AND ANAEROBIC BOTTLES CRITICAL RESULT CALLED TO, READ BACK BY AND VERIFIED WITH: SAM COBLE RN @1221  06/08/20 EB    Culture (A)  Final    STAPHYLOCOCCUS EPIDERMIDIS STAPHYLOCOCCUS HOMINIS CULTURE REINCUBATED FOR BETTER GROWTH Performed at [facility], [place] [address] [place], [place] [address]    Report Status PENDING  Incomplete   Organism ID, Bacteria STAPHYLOCOCCUS EPIDERMIDIS  Final      Susceptibility   Staphylococcus epidermidis - MIC*    CIPROFLOXACIN <=0.5 SENSITIVE Sensitive     ERYTHROMYCIN >=8 RESISTANT Resistant     GENTAMICIN <=0.5 SENSITIVE Sensitive     OXACILLIN <=0.25 SENSITIVE Sensitive     TETRACYCLINE <=1 SENSITIVE Sensitive     VANCOMYCIN <=0.5 SENSITIVE Sensitive     TRIMETH/SULFA <=10 SENSITIVE Sensitive     CLINDAMYCIN <=0.25 SENSITIVE Sensitive     RIFAMPIN <=0.5 SENSITIVE Sensitive     Inducible Clindamycin NEGATIVE Sensitive     * STAPHYLOCOCCUS EPIDERMIDIS  Blood Culture ID Panel (Reflexed)     Status: Abnormal   Collection Time: 06/07/20 11:10 AM  Result Value Ref Range Status   Enterococcus faecalis NOT DETECTED NOT DETECTED Final   Enterococcus Faecium NOT DETECTED NOT DETECTED Final   Listeria monocytogenes NOT DETECTED NOT DETECTED Final   Staphylococcus species DETECTED (A) NOT DETECTED Final    Comment: CRITICAL RESULT CALLED TO, READ BACK BY AND VERIFIED WITH: SAM COBLE RN @1221  06/08/20 EB    Staphylococcus aureus (BCID) NOT DETECTED NOT DETECTED Final   Staphylococcus epidermidis DETECTED (A) NOT DETECTED Final    Comment: Methicillin (oxacillin) resistant coagulase negative staphylococcus. Possible blood culture contaminant (unless isolated from more than one blood culture draw or clinical case suggests pathogenicity). No antibiotic treatment is indicated for blood  culture contaminants. CRITICAL RESULT CALLED TO, READ BACK BY  AND VERIFIED WITH: SAM COBLE RN @1221  06/08/20 EB    Staphylococcus lugdunensis NOT DETECTED NOT DETECTED Final   Streptococcus species NOT DETECTED NOT DETECTED Final   Streptococcus agalactiae NOT DETECTED NOT DETECTED Final   Streptococcus pneumoniae NOT DETECTED NOT DETECTED Final   Streptococcus pyogenes NOT DETECTED NOT DETECTED Final   A.calcoaceticus-baumannii NOT DETECTED NOT DETECTED Final   Bacteroides fragilis NOT DETECTED NOT DETECTED Final   Enterobacterales NOT DETECTED NOT DETECTED Final   Enterobacter cloacae complex NOT DETECTED NOT DETECTED Final   Escherichia coli NOT DETECTED NOT DETECTED Final   Klebsiella aerogenes NOT DETECTED NOT DETECTED Final   Klebsiella oxytoca NOT DETECTED NOT DETECTED Final   Klebsiella pneumoniae NOT DETECTED NOT DETECTED Final   Proteus species NOT DETECTED NOT DETECTED Final   Salmonella species NOT DETECTED NOT DETECTED Final   Serratia marcescens NOT DETECTED NOT DETECTED Final   Haemophilus influenzae NOT DETECTED NOT DETECTED Final   Neisseria meningitidis NOT DETECTED NOT DETECTED Final   Pseudomonas aeruginosa NOT DETECTED NOT DETECTED Final   Stenotrophomonas maltophilia NOT DETECTED NOT DETECTED Final   Candida albicans NOT DETECTED NOT DETECTED Final   Candida auris NOT DETECTED NOT DETECTED Final   Candida glabrata NOT DETECTED NOT DETECTED Final   Candida krusei NOT DETECTED NOT DETECTED Final   Candida parapsilosis NOT DETECTED NOT DETECTED Final   Candida tropicalis NOT DETECTED NOT DETECTED Final   Cryptococcus neoformans/gattii NOT DETECTED NOT DETECTED Final   Methicillin resistance mecA/C DETECTED (A) NOT DETECTED Final    Comment: CRITICAL RESULT CALLED TO, READ BACK BY AND VERIFIED WITH: SAM COBLE RN @1221  06/08/20 EB Performed at [facility], [address] [place], [place] [address]   Resp Panel by RT-PCR (Flu A&B, Covid) Nasopharyngeal Swab     Status: None  Collection Time: 06/07/20 12:08 PM    Specimen: Nasopharyngeal Swab; Nasopharyngeal(NP) swabs in vial transport medium  Result Value Ref Range Status   SARS Coronavirus 2 by RT PCR NEGATIVE NEGATIVE Final    Comment: (NOTE) SARS-CoV-2 target nucleic acids are NOT DETECTED.  The SARS-CoV-2 RNA is generally detectable in upper respiratory specimens during the acute phase of infection. The lowest concentration of SARS-CoV-2 viral copies this assay can detect is 138 copies/mL. A negative result does not preclude SARS-Cov-2 infection and should not be used as the sole basis for treatment or other patient management decisions. A negative result may occur with  improper specimen collection/handling, submission of specimen other than nasopharyngeal swab, presence of viral mutation(s) within the areas targeted by this assay, and inadequate number of viral copies(<138 copies/mL). A negative result must be combined with clinical observations, patient history, and epidemiological information. The expected result is Negative.  Fact Sheet for Patients:  EntrepreneurPulse.com.au  Fact Sheet for Healthcare Providers:  IncredibleEmployment.be  This test is no t yet approved or cleared by the [place] FDA and  has been authorized for detection and/or diagnosis of SARS-CoV-2 by FDA under an Emergency Use Authorization (EUA). This EUA will remain  in effect (meaning this test can be used) for the duration of the COVID-19 declaration under Section 564(b)(1) of the Act, 21 U.S.C.section 360bbb-3(b)(1), unless the authorization is terminated  or revoked sooner.       Influenza A by PCR NEGATIVE NEGATIVE Final   Influenza B by PCR NEGATIVE NEGATIVE Final    Comment: (NOTE) The Xpert Xpress SARS-CoV-2/FLU/RSV plus assay is intended as an aid in the diagnosis of influenza from Nasopharyngeal swab specimens and should not be used as a sole basis for treatment. Nasal washings and aspirates are  unacceptable for Xpert Xpress SARS-CoV-2/FLU/RSV testing.  Fact Sheet for Patients: EntrepreneurPulse.com.au  Fact Sheet for Healthcare Providers: IncredibleEmployment.be  This test is not yet approved or cleared by the [place] FDA and has been authorized for detection and/or diagnosis of SARS-CoV-2 by FDA under an Emergency Use Authorization (EUA). This EUA will remain in effect (meaning this test can be used) for the duration of the COVID-19 declaration under Section 564(b)(1) of the Act, 21 U.S.C. section 360bbb-3(b)(1), unless the authorization is terminated or revoked.  Performed at [facility], [place] [address], [place], [place] [address]   Urine culture     Status: None   Collection Time: 06/07/20  1:34 PM   Specimen: In/Out Cath Urine  Result Value Ref Range Status   Specimen Description   Final    IN/OUT CATH URINE Performed at [facility] [address] [place], [place] [address]    Special Requests   Final    NONE Performed at [facility], [place] [address] [place], [place] [address]    Culture   Final    NO GROWTH Performed at [facility], [place] [address], [place], [place] [address]    Report Status 06/08/2020 FINAL  Final  Blood culture (routine x 2)     Status: None (Preliminary result)   Collection Time: 06/08/20  4:09 PM   Specimen: BLOOD LEFT FOREARM  Result Value Ref Range Status   Specimen Description   Final    BLOOD LEFT FOREARM Performed at [facility] [address] [place], [place] [address]    Special Requests   Final    BOTTLES DRAWN AEROBIC AND ANAEROBIC Blood Culture  results may not be optimal due to an inadequate volume of blood received in culture bottles Performed at [facility], [place] [address], [place], [place] [address]    Culture   Final    NO GROWTH 3 DAYS Performed at [facility], [place] [address], [place], [place] [address]    Report Status PENDING  Incomplete  Blood culture (routine x 2)     Status: None (Preliminary result)   Collection Time: 06/08/20  4:14 PM   Specimen: BLOOD RIGHT FOREARM  Result Value Ref Range Status   Specimen Description   Final    BLOOD RIGHT FOREARM Performed at [facility] [address], [place], [place] [address]    Special Requests   Final    BOTTLES DRAWN AEROBIC ONLY Blood Culture results may not be optimal due to an inadequate volume of blood received in culture bottles Performed at [facility] [address] [place], [place] [address]    Culture   Final    NO GROWTH 3 DAYS Performed at [facility], [place] [address], [place], [place] [address]    Report Status PENDING  Incomplete      Studies:  DG CHEST PORT 1 VIEW  Result Date: 06/10/2020 CLINICAL DATA:  Chest where venous distension. EXAM: PORTABLE CHEST 1 VIEW COMPARISON:  CT chest 05/04/2020 and CT abdomen pelvis 06/07/2020. Chest x-ray 06/07/2020 FINDINGS: No new enlarged cardiac silhouette. The heart size and mediastinal contours are unchanged. Similar peripheral right lower lobe 4 cm opacity consistent with known metastases that are better evaluated on CT chest 05/04/2020 and CT abdomen pelvis 06/07/2020. Bibasilar streaky airspace opacities. No large apical mass. No pulmonary edema. No pleural effusion. No pneumothorax. No acute osseous abnormality. IMPRESSION: No acute cardiopulmonary abnormality in a patient with known pulmonary metastases. Electronically Signed   By: Iven Finn M.D.   On: 06/10/2020 04:26     Benay Pike, MD 06/11/2020  12:35 PM

## 2020-06-11 NOTE — Progress Notes (Signed)
Chaplain visited patient to pray with her. (Spiritual consult placed)  Husband and son were bedside and patient affirmed she wanted to prayer.  She is [employer]al and asked me to pray to [employer]. She belongs to a church called [employer] and her family has been very supportive.   She wanted her husband to rise from his chair and hold hands as we as a group held hands and prayed for her.  The family rejoined with the chaplain in closing with the Lord's prayer.  Patient seems at peace with "what God's truth is." She is a person of deep faith. Rev. Tamsen Snider Pager [phone]

## 2020-06-11 NOTE — Plan of Care (Signed)

## 2020-06-11 NOTE — Telephone Encounter (Signed)
Post ED Visit - Positive Culture Follow-up  Culture report reviewed by antimicrobial stewardship pharmacist: [place] Team []  Elenor Quinones, Pharm.D. []  Heide Guile, Pharm.D., BCPS AQ-ID []  Parks Neptune, Pharm.D., BCPS []  Alycia Rossetti, Pharm.D., BCPS []  [place], Pharm.D., BCPS, AAHIVP []  [name], Pharm.D., BCPS, AAHIVP []  Salome Arnt, PharmD, BCPS []  Johnnette Gourd, PharmD, BCPS []  Hughes Better, PharmD, BCPS []  Leeroy Cha, PharmD []  Laqueta Linden, PharmD, BCPS []  Albertina Parr, PharmD  [place] Team []  Leodis Sias, PharmD []  Lindell Spar, PharmD []  Royetta Asal, PharmD []  Graylin Shiver, Rph []  Rema Fendt) Glennon Mac, PharmD []  Arlyn Dunning, PharmD []  Netta Cedars, PharmD []  Dia Sitter, PharmD []  Leone [place], PharmD []  Gretta Arab, PharmD []  Theodis Shove, PharmD []  Peggyann Juba, PharmD []  Reuel Boom, PharmD Jimmy Footman PharmD   Positive blood culture Patient has been admitted as a inpatient and is receiving treatment, no further patient follow-up is required at this time.  Hazle Nordmann 06/11/2020, 10:01 AM

## 2020-06-11 NOTE — Progress Notes (Signed)
PROGRESS NOTE    [name]  FYB:[id number] DOB: Sep 06, 1960 DOA: 06/08/2020 PCP: Josetta Huddle, MD   Brief Narrative:  [name] is a 60 y.o. female with medical history significant for renal cell carcinoma with metastasis, PE on Eliquis (dx 05/04/20), CVA, hypertension, hyperlipidemia and migraine who presents to the ED for a call back for positive staph culture. Patient was evaluated in the ED yesterday for fever, nausea and vomiting the day following her first immunotherapy infusion on Monday for renal cell carcinoma.  Fever of up to 103.  She had mild leukocytosis at 11.8 and negative CT abdomen.  She was also evaluated bedside by oncology and thought fever could be viral. She was discharged home with follow-up outpatient.  However both sets of blood cultures grew MRSA and she received call back to return back to ER today. Other than nausea and vomiting being new.  Patient continues to have her usual right-sided abdominal pain from her cancer.  CT abdomen and pelvis yesterday with no new acute findings.  Also notes a cough for the past several weeks without fever.  No new worsening shortness of breath.  She has metastasis foci in the lung bilaterally.  Denies any dysuria.  She had a UA yesterday with negative urine culture.  She does not have any other ports or IV lines. Hospitalist called for admission, ID to follow along per protocol.  Assessment & Plan:   Principal Problem:   Bacteremia Active Problems:   Metastatic renal cell carcinoma (HCC)   Pulmonary embolism (HCC)   Hyponatremia   Hyperkalemia   HTN (hypertension)   Pressure injury of skin   Sepsis secondary to staph bacteremia, POA - Pt with fever, nausea and vomiting following first session of immunotherapy infusion Monday - Pt continues to have fevers despite abx - Blood culture positive x2 for MRSA althought Staph epidermitis also seen - ID sidelined for recs - Unclear source - likely GI per ID given profound mets -  no hardware/lines of concern for seeding - continue IV Vanc - Repeat blood cultures prelim negative - Echo without any valvular abnormalities - will follow up with ID - no TEE per patient ID concurs we can treat with PO linezolid for 7-10 days of total abx  Hyponatremia, hypovolemic -  Likely secondary to N/V prior to admission -  Follow with morning labs with continuous IV fluids  Mild hyperkalemia, resolved -follow with repeat labs after fluids  Renal cell carcinoma stage 4, 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 Rob Hickman note prior to admit - pt can hold axitinib if unable to tolerate PO  PE on Eliquis - Recently diagnosed - likely secondary to cancer history - first noted on imaging 05/04/20  - continue Eliquis   HTN hold lisinopril-HCTZ due to hyponatremia   DVT prophylaxis: Eliquis Code Status: DNR Family Communication: None present  Status is: Inpt  Dispo: The patient is from: Home              Anticipated d/c is to: Home              Anticipated d/c date is: 24-48h              Patient currently NOT medically stable for discharge - still requiring further workup/imaging and IV antibiotics  Consultants:   ID, HemeOnc  Procedures:   None  Antimicrobials:  Vancomycin 5/6 - ongoing   Subjective: No acute issues/events overnight, N/V resolved -  poor PO intake and weakness/fatigue ongoing but improving.  Objective: Vitals:   06/10/20 1009 06/10/20 1241 06/10/20 2115 06/11/20 0447  BP: 125/69 (!) 146/67  (!) 149/66  Pulse: (!) 101 96 (!) 110 (!) 109  Resp: (!) 24 18 20 16   Temp: 97.7 F (36.5 C) (!) 97.5 F (36.4 C) 98.6 F (37 C) (!) 101 F (38.3 C)  TempSrc: Oral     SpO2: 96% 98% 97% 92%  Weight:      Height:        Intake/Output Summary (Last 24 hours) at 06/11/2020 0810 Last data filed at 06/10/2020 1800 Gross per 24 hour  Intake 1290.01 ml  Output 1150 ml  Net 140.01 ml   Filed Weights   06/08/20 1547 06/09/20  0207 06/09/20 0207  Weight: 56.2 kg 54.9 kg 54.9 kg    Examination:  General exam: Appears calm and comfortable  Respiratory system: Clear to auscultation. Respiratory effort normal. Cardiovascular system: S1 & S2 heard, RRR. No JVD, murmurs, rubs, gallops or clicks. No pedal edema. Gastrointestinal system: Abdomen is nondistended, soft and nontender. No organomegaly or masses felt. Normal bowel sounds heard. Central nervous system: Alert and oriented. No focal neurological deficits. Extremities: Symmetric 5 x 5 power. Skin: No rashes, lesions or ulcers Psychiatry: Judgement and insight appear normal. Mood & affect appropriate.     Data Reviewed: I have personally reviewed following labs and imaging studies  CBC: Recent Labs  Lab 06/04/20 1303 06/04/20 1303 06/07/20 1110 06/08/20 1609 06/08/20 1634 06/09/20 0341 06/10/20 0334 06/11/20 0354  WBC 6.9  --  11.8* 13.0*  --  11.2* 10.9* 10.2  NEUTROABS 4.6  --  9.9* 9.9*  --   --   --   --   HGB 8.3*   < > 9.0* 9.6* 10.2* 7.7* 8.7* 7.6*  HCT 27.3*  --  29.5* 32.2* 30.0* 25.7* 29.8* 25.6*  MCV 79.8*  --  78.5* 81.1  --  80.1 82.3 81.5  PLT 334  --  278 298  --  260 296 298   < > = values in this interval not displayed.   Basic Metabolic Panel: Recent Labs  Lab 06/07/20 1110 06/08/20 1634 06/08/20 2357 06/09/20 0341 06/10/20 0334 06/10/20 0401 06/11/20 0354  NA 130* 129* 133* 134* 134*  --  132*  K 3.7 5.2* 4.3 3.8 3.9  --  4.2  CL 98 96* 101 103 102  --  101  CO2 23  --  23 22 23   --  24  GLUCOSE 161* 108* 99 87 106*  --  103*  BUN 16 19 14 14 13   --  11  CREATININE 0.83 0.90 0.91 0.97 0.77  --  0.67  CALCIUM 9.2  --  8.7* 8.5* 8.4*  --  8.4*  MG  --   --   --   --   --  1.6*  --    GFR: Estimated Creatinine Clearance: 63 mL/min (by C-G formula based on SCr of 0.67 mg/dL). Liver Function Tests: Recent Labs  Lab 06/04/20 1303 06/07/20 1110 06/09/20 0341  AST 60* 99* 72*  ALT 19 28 25   ALKPHOS 97 93 79   BILITOT 0.3 0.6 0.8  PROT 6.9 6.7 5.9*  ALBUMIN 2.8* 2.9* 2.4*   No results for input(s): LIPASE, AMYLASE in the last 168 hours. No results for input(s): AMMONIA in the last 168 hours. Coagulation Profile: Recent Labs  Lab 06/07/20 1110  INR 2.2*   Cardiac Enzymes: No  results for input(s): CKTOTAL, CKMB, CKMBINDEX, TROPONINI in the last 168 hours. BNP (last 3 results) No results for input(s): PROBNP in the last 8760 hours. HbA1C: No results for input(s): HGBA1C in the last 72 hours. CBG: No results for input(s): GLUCAP in the last 168 hours. Lipid Profile: No results for input(s): CHOL, HDL, LDLCALC, TRIG, CHOLHDL, LDLDIRECT in the last 72 hours. Thyroid Function Tests: No results for input(s): TSH, T4TOTAL, FREET4, T3FREE, THYROIDAB in the last 72 hours. Anemia Panel: No results for input(s): VITAMINB12, FOLATE, FERRITIN, TIBC, IRON, RETICCTPCT in the last 72 hours. Sepsis Labs: Recent Labs  Lab 06/07/20 1110 06/07/20 1324 06/08/20 1609 06/08/20 1809  LATICACIDVEN 1.5 1.7 2.0* 2.0*    Recent Results (from the past 240 hour(s))  Blood Culture (routine x 2)     Status: Abnormal   Collection Time: 06/07/20 11:10 AM   Specimen: BLOOD  Result Value Ref Range Status   Specimen Description   Final    BLOOD LEFT ANTECUBITAL Performed at [facility], [place] [address], [place], [place] [address]    Special Requests   Final    BOTTLES DRAWN AEROBIC AND ANAEROBIC Blood Culture adequate volume Performed at [facility] [address] [place], [place] [address]    Culture  Setup Time   Final    GRAM POSITIVE COCCI AEROBIC BOTTLE ONLY CRITICAL VALUE NOTED.  VALUE IS CONSISTENT WITH PREVIOUSLY REPORTED AND CALLED VALUE.    Culture (A)  Final    STAPHYLOCOCCUS EPIDERMIDIS SUSCEPTIBILITIES PERFORMED ON PREVIOUS CULTURE WITHIN THE LAST 5 DAYS. Performed at [facility], [place] [address] [place], [place] [address]    Report Status 06/10/2020  FINAL  Final  Blood Culture (routine x 2)     Status: Abnormal   Collection Time: 06/07/20 11:10 AM   Specimen: BLOOD  Result Value Ref Range Status   Specimen Description   Final    BLOOD RIGHT ANTECUBITAL Performed at [facility], [place] [address] [place], [place] [address]    Special Requests   Final    BOTTLES DRAWN AEROBIC AND ANAEROBIC Blood Culture adequate volume Performed at [facility] [address], [place], [place] [address]    Culture  Setup Time   Final    GRAM POSITIVE COCCI IN BOTH AEROBIC AND ANAEROBIC BOTTLES CRITICAL RESULT CALLED TO, READ BACK BY AND VERIFIED WITH: SAM COBLE RN @1221  06/08/20 EB    Culture (A)  Final    STAPHYLOCOCCUS EPIDERMIDIS STAPHYLOCOCCUS HOMINIS THE SIGNIFICANCE OF ISOLATING THIS ORGANISM FROM A SINGLE SET OF BLOOD CULTURES WHEN MULTIPLE SETS ARE DRAWN IS UNCERTAIN. PLEASE NOTIFY THE MICROBIOLOGY DEPARTMENT WITHIN ONE WEEK IF SPECIATION AND SENSITIVITIES ARE REQUIRED. Performed at [facility], [place] [address] [place], [place] [address]    Report Status 06/10/2020 FINAL  Final   Organism ID, Bacteria STAPHYLOCOCCUS EPIDERMIDIS  Final      Susceptibility   Staphylococcus epidermidis - MIC*    CIPROFLOXACIN <=0.5 SENSITIVE Sensitive     ERYTHROMYCIN >=8 RESISTANT Resistant     GENTAMICIN <=0.5 SENSITIVE Sensitive     OXACILLIN <=0.25 SENSITIVE Sensitive     TETRACYCLINE <=1 SENSITIVE Sensitive     VANCOMYCIN <=0.5 SENSITIVE Sensitive     TRIMETH/SULFA <=10 SENSITIVE Sensitive     CLINDAMYCIN <=0.25 SENSITIVE Sensitive     RIFAMPIN <=0.5 SENSITIVE Sensitive     Inducible Clindamycin NEGATIVE Sensitive     * STAPHYLOCOCCUS EPIDERMIDIS  Blood Culture  ID Panel (Reflexed)     Status: Abnormal   Collection Time: 06/07/20 11:10 AM  Result Value Ref Range Status   Enterococcus faecalis NOT DETECTED NOT DETECTED Final   Enterococcus Faecium NOT DETECTED NOT DETECTED Final   Listeria monocytogenes NOT  DETECTED NOT DETECTED Final   Staphylococcus species DETECTED (A) NOT DETECTED Final    Comment: CRITICAL RESULT CALLED TO, READ BACK BY AND VERIFIED WITH: SAM COBLE RN @1221  06/08/20 EB    Staphylococcus aureus (BCID) NOT DETECTED NOT DETECTED Final   Staphylococcus epidermidis DETECTED (A) NOT DETECTED Final    Comment: Methicillin (oxacillin) resistant coagulase negative staphylococcus. Possible blood culture contaminant (unless isolated from more than one blood culture draw or clinical case suggests pathogenicity). No antibiotic treatment is indicated for blood  culture contaminants. CRITICAL RESULT CALLED TO, READ BACK BY AND VERIFIED WITH: SAM COBLE RN @1221  06/08/20 EB    Staphylococcus lugdunensis NOT DETECTED NOT DETECTED Final   Streptococcus species NOT DETECTED NOT DETECTED Final   Streptococcus agalactiae NOT DETECTED NOT DETECTED Final   Streptococcus pneumoniae NOT DETECTED NOT DETECTED Final   Streptococcus pyogenes NOT DETECTED NOT DETECTED Final   A.calcoaceticus-baumannii NOT DETECTED NOT DETECTED Final   Bacteroides fragilis NOT DETECTED NOT DETECTED Final   Enterobacterales NOT DETECTED NOT DETECTED Final   Enterobacter cloacae complex NOT DETECTED NOT DETECTED Final   Escherichia coli NOT DETECTED NOT DETECTED Final   Klebsiella aerogenes NOT DETECTED NOT DETECTED Final   Klebsiella oxytoca NOT DETECTED NOT DETECTED Final   Klebsiella pneumoniae NOT DETECTED NOT DETECTED Final   Proteus species NOT DETECTED NOT DETECTED Final   Salmonella species NOT DETECTED NOT DETECTED Final   Serratia marcescens NOT DETECTED NOT DETECTED Final   Haemophilus influenzae NOT DETECTED NOT DETECTED Final   Neisseria meningitidis NOT DETECTED NOT DETECTED Final   Pseudomonas aeruginosa NOT DETECTED NOT DETECTED Final   Stenotrophomonas maltophilia NOT DETECTED NOT DETECTED Final   Candida albicans NOT DETECTED NOT DETECTED Final   Candida auris NOT DETECTED NOT DETECTED Final    Candida glabrata NOT DETECTED NOT DETECTED Final   Candida krusei NOT DETECTED NOT DETECTED Final   Candida parapsilosis NOT DETECTED NOT DETECTED Final   Candida tropicalis NOT DETECTED NOT DETECTED Final   Cryptococcus neoformans/gattii NOT DETECTED NOT DETECTED Final   Methicillin resistance mecA/C DETECTED (A) NOT DETECTED Final    Comment: CRITICAL RESULT CALLED TO, READ BACK BY AND VERIFIED WITH: SAM COBLE RN @1221  06/08/20 EB Performed at [facility], [address], [place], [place] [address]   Resp Panel by RT-PCR (Flu A&B, Covid) Nasopharyngeal Swab     Status: None   Collection Time: 06/07/20 12:08 PM   Specimen: Nasopharyngeal Swab; Nasopharyngeal(NP) swabs in vial transport medium  Result Value Ref Range Status   SARS Coronavirus 2 by RT PCR NEGATIVE NEGATIVE Final    Comment: (NOTE) SARS-CoV-2 target nucleic acids are NOT DETECTED.  The SARS-CoV-2 RNA is generally detectable in upper respiratory specimens during the acute phase of infection. The lowest concentration of SARS-CoV-2 viral copies this assay can detect is 138 copies/mL. A negative result does not preclude SARS-Cov-2 infection and should not be used as the sole basis for treatment or other patient management decisions. A negative result may occur with  improper specimen collection/handling, submission of specimen other than nasopharyngeal swab, presence of viral mutation(s) within the areas targeted by this assay, and inadequate number of viral copies(<138 copies/mL). A negative result must be combined  with clinical observations, patient history, and epidemiological information. The expected result is Negative.  Fact Sheet for Patients:  EntrepreneurPulse.com.au  Fact Sheet for Healthcare Providers:  IncredibleEmployment.be  This test is no t yet approved or cleared by the [place] FDA and  has been authorized for detection and/or diagnosis of SARS-CoV-2  by FDA under an Emergency Use Authorization (EUA). This EUA will remain  in effect (meaning this test can be used) for the duration of the COVID-19 declaration under Section 564(b)(1) of the Act, 21 U.S.C.section 360bbb-3(b)(1), unless the authorization is terminated  or revoked sooner.       Influenza A by PCR NEGATIVE NEGATIVE Final   Influenza B by PCR NEGATIVE NEGATIVE Final    Comment: (NOTE) The Xpert Xpress SARS-CoV-2/FLU/RSV plus assay is intended as an aid in the diagnosis of influenza from Nasopharyngeal swab specimens and should not be used as a sole basis for treatment. Nasal washings and aspirates are unacceptable for Xpert Xpress SARS-CoV-2/FLU/RSV testing.  Fact Sheet for Patients: EntrepreneurPulse.com.au  Fact Sheet for Healthcare Providers: IncredibleEmployment.be  This test is not yet approved or cleared by the [place] FDA and has been authorized for detection and/or diagnosis of SARS-CoV-2 by FDA under an Emergency Use Authorization (EUA). This EUA will remain in effect (meaning this test can be used) for the duration of the COVID-19 declaration under Section 564(b)(1) of the Act, 21 U.S.C. section 360bbb-3(b)(1), unless the authorization is terminated or revoked.  Performed at [facility], [place] [address], [place], [place] [address]   Urine culture     Status: None   Collection Time: 06/07/20  1:34 PM   Specimen: In/Out Cath Urine  Result Value Ref Range Status   Specimen Description   Final    IN/OUT CATH URINE Performed at [facility] [address] [place], [place] [address]    Special Requests   Final    NONE Performed at [facility], [place] [address], [place], [place] [address]    Culture   Final    NO GROWTH Performed at [facility], [place] [address], [place], [place] [address]    Report Status 06/08/2020 FINAL  Final  Blood culture  (routine x 2)     Status: None (Preliminary result)   Collection Time: 06/08/20  4:09 PM   Specimen: BLOOD LEFT FOREARM  Result Value Ref Range Status   Specimen Description   Final    BLOOD LEFT FOREARM Performed at [facility] [address] [place], [place] [address]    Special Requests   Final    BOTTLES DRAWN AEROBIC AND ANAEROBIC Blood Culture results may not be optimal due to an inadequate volume of blood received in culture bottles Performed at [facility] [address], [place], [place] [address]    Culture   Final    NO GROWTH 2 DAYS Performed at [facility] [address], [place], [place] [address]    Report Status PENDING  Incomplete  Blood culture (routine x 2)     Status: None (Preliminary result)   Collection Time: 06/08/20  4:14 PM   Specimen: BLOOD RIGHT FOREARM  Result Value Ref Range Status   Specimen Description   Final    BLOOD RIGHT FOREARM Performed at [facility] [address], [place], [place] [address]    Special Requests   Final    BOTTLES DRAWN AEROBIC ONLY Blood Culture  results may not be optimal due to an inadequate volume of blood received in culture bottles Performed at [facility], [place] [address] [place], [place] [address]    Culture   Final    NO GROWTH 2 DAYS Performed at [facility] [address] [place], [place] [address]    Report Status PENDING  Incomplete         Radiology Studies: DG CHEST PORT 1 VIEW  Result Date: 06/10/2020 CLINICAL DATA:  Chest where venous distension. EXAM: PORTABLE CHEST 1 VIEW COMPARISON:  CT chest 05/04/2020 and CT abdomen pelvis 06/07/2020. Chest x-ray 06/07/2020 FINDINGS: No new enlarged cardiac silhouette. The heart size and mediastinal contours are unchanged. Similar peripheral right lower lobe 4 cm opacity consistent with known metastases that are better evaluated on CT chest 05/04/2020 and CT abdomen  pelvis 06/07/2020. Bibasilar streaky airspace opacities. No large apical mass. No pulmonary edema. No pleural effusion. No pneumothorax. No acute osseous abnormality. IMPRESSION: No acute cardiopulmonary abnormality in a patient with known pulmonary metastases. Electronically Signed   By: Iven Finn M.D.   On: 06/10/2020 04:26   ECHOCARDIOGRAM COMPLETE  Result Date: 06/09/2020    ECHOCARDIOGRAM REPORT   Patient Name:   [name] Garry Date of Exam: 06/09/2020 Medical Rec #:  [id number]       Height:       64.0 in Accession #:    [id number]      Weight:       121.0 lb Date of Birth:  11-17-1960        BSA:          1.580 m Patient Age:    6 years        BP:           162/74 mmHg Patient Gender: F               HR:           113 bpm. Exam Location:  Inpatient Procedure: 2D Echo, 3D Echo, Cardiac Doppler and Color Doppler Indications:    R01.1 Murmur  History:        Patient has no prior history of Echocardiogram examinations.                 Abnormal ECG, Stroke, Signs/Symptoms:Bacteremia; Risk                 Factors:Hypertension. Cancer. Pulmonary embolus.  Sonographer:    Roseanna Rainbow RDCS Referring Phys: [id number] [place] T TU  Sonographer Comments: Technically difficult study due to poor echo windows, suboptimal parasternal window and suboptimal apical window. IMPRESSIONS  1. Left ventricular ejection fraction, by estimation, is 60 to 65%. Left ventricular ejection fraction by 3D volume is 59 %. The left ventricle has normal function. The left ventricle has no regional wall motion abnormalities. There is mild concentric left ventricular hypertrophy. Indeterminate diastolic filling due to E-A fusion.  2. Right ventricular systolic function is normal. The right ventricular size is normal. There is mildly elevated pulmonary artery systolic pressure.  3. Left atrial size was mildly dilated.  4. The mitral valve is normal in structure. Trivial mitral valve regurgitation. No evidence of mitral stenosis.  5. The aortic  valve is normal in structure. Aortic valve regurgitation is not visualized. No aortic stenosis is present.  6. There is a mobile, nonobstructive mass in the proximal inferior vena cava, measuring at least 3 cm in length, roughly 1 cm in diameter, may represent  a thrombembolus in transit or extension of renal cell carcinoma. The inferior vena cava is dilated in size with <50% respiratory variability, suggesting right atrial pressure of 15 mmHg. Conclusion(s)/Recommendation(s): No evidence of valvular vegetations on this transthoracic echocardiogram. Would recommend a transesophageal echocardiogram to exclude infective endocarditis if clinically indicated. 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 FINDINGS  Left Ventricle: Left ventricular ejection fraction, by estimation, is 60 to 65%. Left ventricular ejection fraction by 3D volume is 59 %. The left ventricle has normal function. The left ventricle has no regional wall motion abnormalities. The left ventricular internal cavity size was normal in size. There is mild concentric left ventricular hypertrophy. Indeterminate diastolic filling due to E-A fusion. Normal left ventricular filling pressure. Right Ventricle: The right ventricular size is normal. No increase in right ventricular wall thickness. Right ventricular systolic function is normal. There is mildly elevated pulmonary artery systolic pressure. The tricuspid regurgitant velocity is 2.59  m/s, and with an assumed right atrial pressure of 15 mmHg, the estimated right ventricular systolic pressure is 83.4 mmHg. Left Atrium: Left atrial size was mildly dilated. Right Atrium: Right atrial size was normal in size. Prominent Eustachian valve. Pericardium: There is no evidence of pericardial effusion. Mitral Valve: The mitral valve is normal in structure. Trivial mitral valve regurgitation. No evidence of mitral valve stenosis. Tricuspid Valve: The tricuspid valve is normal in structure. Tricuspid valve regurgitation is  trivial. No evidence of tricuspid stenosis. Aortic Valve: The aortic valve is normal in structure. Aortic valve regurgitation is not visualized. No aortic stenosis is present. Pulmonic Valve: The pulmonic valve was normal in structure. Pulmonic valve regurgitation is not visualized. No evidence of pulmonic stenosis. Aorta: The aortic root is normal in size and structure. Venous: There is a mobile, nonobstructive mass in the proximal inferior vena cava, measuring at least 3 cm in length, roughly 1 cm in diameter, may represent a thrombembolus in transit or extension of renal cell carcinoma. The inferior vena cava is dilated in size with less than 50% respiratory variability, suggesting right atrial pressure of 15 mmHg. IAS/Shunts: No atrial level shunt detected by color flow Doppler.  LEFT VENTRICLE PLAX 2D LVIDd:         3.60 cm LVIDs:         2.20 cm LV PW:         1.25 cm         3D Volume EF LV IVS:        1.20 cm         LV 3D EF:    Left LVOT diam:     2.00 cm                      ventricular LV SV:         79                           ejection LV SV Index:   50                           fraction by LVOT Area:     3.14 cm                     3D volume  is 59 %.  LV Volumes (MOD) LV vol d, MOD    75.9 ml       3D Volume EF: A2C:                           3D EF:        59 % LV vol d, MOD    52.0 ml       LV EDV:       113 ml A4C:                           LV ESV:       46 ml LV vol s, MOD    33.7 ml       LV SV:        67 ml A2C: LV vol s, MOD    16.2 ml A4C: LV SV MOD A2C:   42.2 ml LV SV MOD A4C:   52.0 ml LV SV MOD BP:    39.6 ml RIGHT VENTRICLE             IVC RV S prime:     15.50 cm/s  IVC diam: 2.70 cm TAPSE (M-mode): 2.2 cm LEFT ATRIUM           Index       RIGHT ATRIUM           Index LA diam:      4.30 cm 2.72 cm/m  RA Area:     10.60 cm LA Vol (A2C): 27.7 ml 17.53 ml/m RA Volume:   19.50 ml  12.34 ml/m LA Vol (A4C): 50.6 ml 32.02 ml/m  AORTIC VALVE LVOT  Vmax:   155.00 cm/s LVOT Vmean:  89.300 cm/s LVOT VTI:    0.251 m  AORTA Ao Root diam: 3.10 cm Ao Asc diam:  3.50 cm MITRAL VALVE               TRICUSPID VALVE MV Area (PHT): 9.60 cm    TR Peak grad:   26.8 mmHg MV Decel Time: 79 msec     TR Vmax:        259.00 cm/s MV E velocity: 98.80 cm/s                            SHUNTS                            Systemic VTI:  0.25 m                            Systemic Diam: 2.00 cm Dani Gobble Croitoru MD Electronically signed by Sanda Klein MD Signature Date/Time: 06/09/2020/11:13:00 AM    Final    Scheduled Meds: . apixaban  5 mg Oral BID  . feeding supplement  237 mL Oral BID BM  . fenofibrate  54 mg Oral Daily  . multivitamin with minerals  1 tablet Oral Daily  . senna-docusate  1 tablet Oral BID   Continuous Infusions: . vancomycin 1,000 mg (06/10/20 1009)     LOS: 3 days   Time spent: 75min  Jurgen Groeneveld C Kareem Cathey, DO [facility]  If 7PM-7AM, please contact night-coverage [email or web address]  06/11/2020, 8:10 AM

## 2020-06-12 DIAGNOSIS — Z515 Encounter for palliative care: Secondary | ICD-10-CM

## 2020-06-12 DIAGNOSIS — R7881 Bacteremia: Secondary | ICD-10-CM | POA: Diagnosis not present

## 2020-06-12 LAB — CBC
HCT: 25.3 % — ABNORMAL LOW (ref 36.0–46.0)
Hemoglobin: 7.6 g/dL — ABNORMAL LOW (ref 12.0–15.0)
MCH: 24.1 pg — ABNORMAL LOW (ref 26.0–34.0)
MCHC: 30 g/dL (ref 30.0–36.0)
MCV: 80.1 fL (ref 80.0–100.0)
Platelets: 297 10*3/uL (ref 150–400)
RBC: 3.16 MIL/uL — ABNORMAL LOW (ref 3.87–5.11)
RDW: 15.3 % (ref 11.5–15.5)
WBC: 10.6 10*3/uL — ABNORMAL HIGH (ref 4.0–10.5)
nRBC: 0 % (ref 0.0–0.2)

## 2020-06-12 LAB — BASIC METABOLIC PANEL
Anion gap: 7 (ref 5–15)
BUN: 12 mg/dL (ref 8–23)
CO2: 25 mmol/L (ref 22–32)
Calcium: 8.8 mg/dL — ABNORMAL LOW (ref 8.9–10.3)
Chloride: 100 mmol/L (ref 98–111)
Creatinine, Ser: 0.71 mg/dL (ref 0.44–1.00)
GFR, Estimated: >60 mL/min (ref >60)
Glucose, Bld: 156 mg/dL — ABNORMAL HIGH (ref 70–99)
Potassium: 4.2 mmol/L (ref 3.5–5.1)
Sodium: 132 mmol/L — ABNORMAL LOW (ref 135–145)

## 2020-06-12 MED ORDER — HYDROCODONE-ACETAMINOPHEN 5-325 MG PO TABS: 1.0000 | ORAL_TABLET | Freq: Four times a day (QID) | ORAL | 0 refills | Status: AC | PRN

## 2020-06-12 MED ORDER — BISACODYL 5 MG PO TBEC: 5.0000 mg | DELAYED_RELEASE_TABLET | Freq: Every day | ORAL | 0 refills | Status: AC | PRN

## 2020-06-12 MED ORDER — LINEZOLID 600 MG PO TABS: 600.0000 mg | ORAL_TABLET | Freq: Two times a day (BID) | ORAL | 0 refills | Status: AC

## 2020-06-12 MED ORDER — SORBITOL 70 % SOLN
200.0000 mL | TOPICAL_OIL | Freq: Once | ORAL | Status: DC
  Filled 2020-06-12: qty 60

## 2020-06-12 NOTE — Discharge Summary (Signed)
Physician Discharge Summary  GRETA YUNG QQV:[id number] DOB: Aug 10, 1960 DOA: 06/08/2020  PCP: Josetta Huddle, MD  Admit date: 06/08/2020 Discharge date: 06/12/2020  Admitted From: Home Disposition: Home  Recommendations for Outpatient Follow-up:  1. Follow up with PCP in 1-2 weeks 2. Please obtain BMP/CBC in one week 3. Please follow up with oncology as scheduled  Home Health: None  Equipment/Devices: None  Discharge Condition: Guarded  CODE STATUS: DNR  Diet recommendation:   As tolerated  Brief/Interim Summary: Kimberlyann Hollar Broachis a 60 y.o.femalewith medical history significant forrenal cell carcinoma with metastasis, PE on Eliquis (dx 05/04/20),CVA, hypertension, hyperlipidemia and migraine who presents to the ED for a call back for positive staph culture. Patient was evaluated in the ED yesterday for fever, nausea and vomiting the day following her first immunotherapy infusion on Monday for renal cell carcinoma. Fever of up to 103. She had mild leukocytosis at 11.8 and negative CT abdomen. She was also evaluated bedside by oncology and thought fever could be viral. She was discharged home with follow-up outpatient. However both sets of blood cultures grew MRSA and she received call back to return back to ER today. Other than nausea and vomiting being new. Patient continues to have her usual right-sided abdominal pain from her cancer. CT abdomen and pelvis yesterday with no new acute findings. Also notes a cough for the past several weeks without fever. No new worsening shortness of breath. She has metastasis foci in the lung bilaterally. Denies any dysuria. She had a UA yesterday with negative urine culture. She does not have any other ports or IV lines. Hospitalist called for admission, ID to follow along per protocol.  Patient admitted as above after notable labs for staph bacteremia - patient was initially febrile, on vancomycin patient's cultures have cleared she is no  longer febrile now for 48 hours, will be discharged home on remainder of course with linezolid per discussion with ID. Patient to follow-up with palliative care and hospice in the outpatient setting. She indicated today that she did not want to continue to be readmitted to the hospital as she continues to deteriorate in the setting of metastatic disease which is certainly reasonable.  We discussed that given her known disease she would likely continue to have some GI tract translocation of bacteria and is extremely high risk for ongoing bacteremia or intra-abdominal infections.  She wishes that future episodes be addressed and taken care of at home and otherwise would not want to be readmitted to the hospital as she is concerned that she will deteriorate while here and be unable to return home and be with family which is her main goal.  Patient otherwise stable and agreeable for discharge home as outlined above.  She has known follow-up with oncology in the outpatient setting as early as next week for further discussion about immunotherapy.   Discharge Diagnoses:  Principal Problem:   Bacteremia Active Problems:   Metastatic renal cell carcinoma (HCC)   Pulmonary embolism (HCC)   Hyponatremia   Hyperkalemia   HTN (hypertension)   Pressure injury of skin   Palliative care patient    Discharge Instructions  Discharge Instructions    Call MD for:  severe uncontrolled pain   Complete by: As directed    Diet general   Complete by: As directed    Increase activity slowly   Complete by: As directed    No wound care   Complete by: As directed      Allergies as of  06/12/2020      Reactions   Amlodipine    Other reaction(s): dizziness   Crestor [rosuvastatin] Other (See Comments)   Muscle cramps      Medication List    TAKE these medications   acetaminophen 500 MG tablet Commonly known as: TYLENOL Take 1,000 mg by mouth every 6 (six) hours as needed for moderate pain.   apixaban 5  MG Tabs tablet Commonly known as: ELIQUIS Take 1 tablet (5 mg total) by mouth 2 (two) times daily.   bisacodyl 5 MG EC tablet Commonly known as: DULCOLAX Take 1 tablet (5 mg total) by mouth daily as needed for moderate constipation.   fenofibrate 145 MG tablet Commonly known as: TRICOR Take 145 mg by mouth daily.   HYDROcodone-acetaminophen 5-325 MG tablet Commonly known as: NORCO/VICODIN Take 1-2 tablets by mouth every 6 (six) hours as needed for up to 7 days for moderate pain or severe pain.   Inlyta 5 MG tablet Generic drug: axitinib Take 1 tablet (5 mg total) by mouth 2 (two) times daily.   lidocaine-prilocaine cream Commonly known as: EMLA Apply to affected area once   linezolid 600 MG tablet Commonly known as: ZYVOX Take 1 tablet (600 mg total) by mouth 2 (two) times daily for 7 days.   LORazepam 0.5 MG tablet Commonly known as: ATIVAN Take 1 tablet (0.5 mg total) by mouth 2 (two) times daily as needed for anxiety.   losartan-hydrochlorothiazide 100-25 MG tablet Commonly known as: HYZAAR Take 1 tablet by mouth daily.   mirtazapine 15 MG tablet Commonly known as: Remeron Take 1 tablet (15 mg total) by mouth at bedtime.   ondansetron 8 MG tablet Commonly known as: Zofran Take 1 tablet (8 mg total) by mouth 2 (two) times daily as needed (Nausea or vomiting). What changed: reasons to take this   One Daily tablet Take 1 tablet by mouth daily.   prochlorperazine 10 MG tablet Commonly known as: COMPAZINE Take 1 tablet (10 mg total) by mouth every 6 (six) hours as needed (Nausea or vomiting). What changed: reasons to take this   traMADol 50 MG tablet Commonly known as: ULTRAM Take 1 tablet (50 mg total) by mouth 3 (three) times daily as needed. What changed: reasons to take this       Allergies  Allergen Reactions  . Amlodipine     Other reaction(s): dizziness  . Crestor [Rosuvastatin] Other (See Comments)    Muscle cramps     Consultations:  Oncology   Procedures/Studies: MR ANGIO HEAD WO CONTRAST  Result Date: 05/23/2020 [facility] NEUROIMAGING REPORT STUDY DATE: 05/22/2020 PATIENT NAME: SHALIN LINDERS DOB: 1960-03-12 MRN: [id number] ORDERING CLINICIAN: Kathrynn Ducking, MD CLINICAL HISTORY: 60 year old female with stroke. EXAM: MR ANGIO HEAD WO CONTRAST TECHNIQUE: MR angiogram of the head was obtained utilizing 3D time of flight sequences from below the vertebrobasilar junction up to the intracranial vasculature without contrast.  Computerized reconstructions were obtained. CONTRAST: none COMPARISON: none IMAGING SITE: [facility] [address] (1.5 Tesla MRI) FINDINGS: This study is of adequate technical quality. Decreased flow signal within the bilateral left worse than right paraclinoid internal carotid arteries with normal appearing proximal and terminal ICA flow signals.  This may represent atherosclerosis and stenosis versus artifactual changes.  Flow signal of the bilateral internal carotid arteries have no stenosis. The bilateral middle and anterior cerebral arteries have no stenosis. The bilateral vertebral, basilar, bilateral posterior cerebral arteries have no stenosis. No aneurysmal dilatations are seen.  MRA head (without) demonstrating: - Decreased flow signal within the bilateral lparaclinoid internal carotid arteries (left worse than right) with normal appearing proximal and terminal ICA flow signals.  This may represent atherosclerosis and stenosis versus artifactual changes. INTERPRETING PHYSICIAN: Penni Bombard, MD Certified in Neurology, Neurophysiology and Neuroimaging [facility] [address], [place], [place] [address] [phone]   MR ANGIO NECK W WO CONTRAST  Result Date: 05/23/2020 [facility] NEUROIMAGING REPORT STUDY DATE: 05/22/2020 PATIENT NAME: LAQUASHA GROOME DOB: 1960-12-27 MRN: [id number] ORDERING  CLINICIAN: Kathrynn Ducking, MD CLINICAL HISTORY: 60 year old female with stroke. EXAM: MR ANGIO NECK W WO CONTRAST TECHNIQUE: MR angiogram of the neck was obtained utilizing 3D time-of-flight sequences from the aortic arch up to the intracranial vasculature postbolus contrast infusion.  2D time-of-flight noncontrast views and computerized reconstructions were obtained. CONTRAST: 20 mL MultiHance COMPARISON: none IMAGING SITE: [facility] [address] (1.5 Tesla MRI) FINDINGS: This study is of adequate technical quality.  There is anterograde flow in the bilateral vertebral and carotid arteries on 2D-TOF views.  The flow signal of the left subclavian artery has no stenosis.  The left common, internal and external carotid arteries have no stenosis.  The left vertebral artery has no stenosis from its origin up to the vertebrobasilar junction.  On the right brachiocephalic trunk and subclavian arteries have no stenosis. The right common, internal and external carotid arteries have no stenosis. The right vertebral artery has no stenosis from its origin to the vertebrobasilar junction. Limited views of the intracranial vasculature are notable for slightly decreased flow signal within the left paraclinoid internal carotid artery as noted on MRA head from same day.   MRA neck with and without contrast demonstrating: -Extracranial carotid arteries have no stenosis.  Vertebral arteries have no stenosis. -Slightly decreased flow signal within left paraclinoid internal carotid artery intracranially as noted on MRA head from same day. INTERPRETING PHYSICIAN: Penni Bombard, MD Certified in Neurology, Neurophysiology and Neuroimaging [facility] [address] [place],  [address] [phone]   CT Abdomen Pelvis W Contrast  Addendum Date: 06/07/2020   ADDENDUM REPORT: 06/07/2020 17:27 ADDENDUM: Thrombus in a branch of the SMV in the RIGHT lower quadrant is mildly  expansile and may reflect tumor thrombus in this area. There are collateral pathways through the mesentery. The vessel upstream from this is patent. This was likely present previously. Attention on follow-up is suggested. Would also correlate with any worsening RIGHT lower quadrant pain. These results were called by telephone at the time of interpretation on 06/07/2020 at 5:25 pm to provider Dr. Ronnald Nian, Who verbally acknowledged these results. Electronically Signed   By: Zetta Bills M.D.   On: 06/07/2020 17:27   Result Date: 06/07/2020 CLINICAL DATA:  Abdominal abscess, infection suspected. Currently on immunotherapy with nausea vomiting and fever. History of RIGHT renal cell carcinoma. EXAM: CT ABDOMEN AND PELVIS WITH CONTRAST TECHNIQUE: Multidetector CT imaging of the abdomen and pelvis was performed using the standard protocol following bolus administration of intravenous contrast. CONTRAST:  143mL OMNIPAQUE IOHEXOL 300 MG/ML  SOLN COMPARISON:  Prior imaging from May 04, 2020. FINDINGS: Lower chest: Large LEFT lower lobe mass 3.9 x 2.8 cm previously approximately 4.3 x 3.2 cm. Medial lung base on the LEFT 3 cm compared to 3.3 cm mass. Medial lung base on the RIGHT (image 7/4) 2.8 cm RIGHT lower lobe mass previously approximately 2.4 cm greatest axial dimension. Other masses show a similar appearance to  the prior study obtain just a short time ago. No effusion. No consolidation. Suspected RIGHT lower lobe pulmonary embolism (image 89/5) potential embolism also on image 95/5 in the LEFT lower lobe Hepatobiliary: Thrombus extending directly into the IVC from a large mass in the RIGHT liver and from the RIGHT renal vein, extending just above the IVC confluence. Grossly similar to the prior study. Large RIGHT hepatic lobe mass with heterogeneous enhancement measuring 12 cm greatest axial dimension extending from the RIGHT renal mass. Grossly similar. Anterior RIGHT hepatic lobe mass (image 10/2 6.5 as compared to  6.3 cm. The show heterogeneous and peripheral enhancement. New lesion along the anterior surface of the medial segment LEFT hepatic lobe (image 28/2) 17 mm. New lesion on image 32/2 in hepatic subsegment V 16 mm. Enlarging lesions in the RIGHT hepatic lobe in hepatic subsegment VI (image 43/2) subcentimeter area on the prior study as large is 2.9 cm on today's study. Lesion along the inferior RIGHT hemi liver 4.3 cm as compared to 3.9 cm (image 51/2) Marked enlargement of a lesion along the inferior LEFT hemi liver medial segment (image 41/25.9 cm as compared to 2.7 cm. Pancreas: Normal, without mass, inflammation or ductal dilatation. Spleen: Normal spleen. Adrenals/Urinary Tract: Adrenal glands with obscured LEFT adrenal secondary to large mass potentially involved. The LEFT adrenal is normal. The LEFT kidney is normal. Urinary bladder with smooth contour. RIGHT kidney with infiltrative mass contiguous with large mass in the RIGHT hemi liver. Invasion of RIGHT renal vein. Extension into IVC and via RIGHT hepatic vein from the intrahepatic portion of the mass. Stomach/Bowel: Stomach is unremarkable. Small bowel without dilation or adjacent stranding. Proximal colon is collapsed. Appendix not visualized. Stool in the distal colon. No adjacent stranding. No perianal stranding. Appendix appears to turn medially away from soft tissue in the pelvis and is not inflamed, no significant change in the appearance of this area since previous imaging Vascular/Lymphatic: Enlarging intra-aortocaval adenopathy in keeping with enlargement of disease elsewhere. 1.8 cm intra-aortocaval lymph node previously 1.5 cm. Increase in general and soft tissue in the intra-aortocaval groove. (Image 38/2) Scattered smaller lymph nodes throughout the retroperitoneum more conspicuous than on previous imaging. No pelvic adenopathy but with retroperitoneal soft tissue in the RIGHT retroperitoneum measuring 4.0 x 1.2 cm previously 4.4 x 1.5 cm.  Aortic atherosclerosis. No aneurysmal dilation. Patent abdominal vessels. Reproductive: Post hysterectomy. Other: Small volume ascites in the pelvis.  No free air. Musculoskeletal: Spinal degenerative changes. No acute or destructive bone finding. IMPRESSION: 1. Enlarging hepatic lesions and new hepatic lesions with enlarging upper abdominal adenopathy as described. 2. Signs of pulmonary emboli, again noted in RIGHT lower lobe and perhaps with small new thrombus in LEFT lower lobe, difficult to assess on venous phase. 3. Signs of tumor and likely bland thrombus extending into IVC and RIGHT hepatic vein as well as into the inferior vena cava just above the RIGHT hemidiaphragm. 4. Mixed response with respect to pulmonary findings. 5. Small volume ascites in the pelvis. 6. Aortic atherosclerosis. These results were called by telephone at the time of interpretation on 06/07/2020 at 3:43 pm to provider [facility] , who verbally acknowledged these results. Electronically Signed: By: Zetta Bills M.D. On: 06/07/2020 15:43   US BIOPSY (LIVER)  Result Date: 05/17/2020 INDICATION: 60 year old with a large right renal mass and multiple liver and pulmonary lesions. Findings are compatible with metastatic disease and patient needs a tissue diagnosis. EXAM: ULTRASOUND-GUIDED LIVER LESION BIOPSY MEDICATIONS: None. ANESTHESIA/SEDATION: Moderate (conscious) sedation  was employed during this procedure. A total of Versed 2.0 mg and Fentanyl 100 mcg was administered intravenously. Moderate Sedation Time: 14 minutes. The patient's level of consciousness and vital signs were monitored continuously by radiology nursing throughout the procedure under my direct supervision. FLUOROSCOPY TIME:  None COMPLICATIONS: None immediate. PROCEDURE: Informed written consent was obtained from the patient after a thorough discussion of the procedural risks, benefits and alternatives. All questions were addressed. Maximal Sterile Barrier Technique  was utilized including caps, mask, sterile gowns, sterile gloves, sterile drape, hand hygiene and skin antiseptic. A timeout was performed prior to the initiation of the procedure. Liver was evaluated with ultrasound. Lesion in the inferior right hepatic lobe was identified and targeted for biopsy. Abdomen was prepped with chlorhexidine and sterile field was created. Skin and soft tissues were anesthetized using 1% lidocaine. Small incision was made. Using ultrasound guidance, 17 gauge coaxial needle was directed into the right hepatic lobe and lesion. Core biopsies were obtained with an 18 gauge core device. Specimens placed in formalin. Gel-Foam slurry was injected through the 17 gauge needle as it was removed. No immediate bleeding. Bandage placed over the puncture site. FINDINGS: Several heterogeneous lesions in the liver compatible with known findings. Biopsy needle was directed into an inferior right hepatic lesion. No immediate bleeding or hematoma formation. IMPRESSION: Ultrasound-guided core biopsy of a right hepatic lesion. Electronically Signed   By: Markus Daft M.D.   On: 05/17/2020 17:12   DG CHEST PORT 1 VIEW  Result Date: 06/10/2020 CLINICAL DATA:  Chest where venous distension. EXAM: PORTABLE CHEST 1 VIEW COMPARISON:  CT chest 05/04/2020 and CT abdomen pelvis 06/07/2020. Chest x-ray 06/07/2020 FINDINGS: No new enlarged cardiac silhouette. The heart size and mediastinal contours are unchanged. Similar peripheral right lower lobe 4 cm opacity consistent with known metastases that are better evaluated on CT chest 05/04/2020 and CT abdomen pelvis 06/07/2020. Bibasilar streaky airspace opacities. No large apical mass. No pulmonary edema. No pleural effusion. No pneumothorax. No acute osseous abnormality. IMPRESSION: No acute cardiopulmonary abnormality in a patient with known pulmonary metastases. Electronically Signed   By: Iven Finn M.D.   On: 06/10/2020 04:26   DG Chest Port 1 View  Result  Date: 06/07/2020 CLINICAL DATA:  Lung carcinoma with fever EXAM: PORTABLE CHEST 1 VIEW COMPARISON:  Chest CT May 04, 2020 FINDINGS: Multiple nodular opacities again noted throughout the lungs bilaterally. Largest nodular opacities in left lower lobe with largest nodular opacities measuring 4.1 x 2.8 cm and 4.3 x 3.2 cm. No edema or airspace opacity. Heart size and pulmonary vascular normal. No adenopathy evident by radiography. No bone lesions. 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 No edema or consolidation. Heart size normal. Electronically Signed   By: Lowella Grip III M.D.   On: 06/07/2020 12:18   ECHOCARDIOGRAM COMPLETE  Result Date: 06/09/2020    ECHOCARDIOGRAM REPORT   Patient Name:   SAMIE BARCLIFT Minella Date of Exam: 06/09/2020 Medical Rec #:  [id number]       Height:       64.0 in Accession #:    [id number]      Weight:       121.0 lb Date of Birth:  Mar 21, 1960        BSA:          1.580 m Patient Age:    26 years        BP:  162/74 mmHg Patient Gender: F               HR:           113 bpm. Exam Location:  Inpatient Procedure: 2D Echo, 3D Echo, Cardiac Doppler and Color Doppler Indications:    R01.1 Murmur  History:        Patient has no prior history of Echocardiogram examinations.                 Abnormal ECG, Stroke, Signs/Symptoms:Bacteremia; Risk                 Factors:Hypertension. Cancer. Pulmonary embolus.  Sonographer:    Roseanna Rainbow RDCS Referring Phys: [id number] [place] [facility]  Sonographer Comments: Technically difficult study due to poor echo windows, suboptimal parasternal window and suboptimal apical window. IMPRESSIONS  1. Left ventricular ejection fraction, by estimation, is 60 to 65%. Left ventricular ejection fraction by 3D volume is 59 %. The left ventricle has normal function. The left ventricle has no regional wall motion abnormalities. There is mild concentric left ventricular hypertrophy. Indeterminate diastolic filling due  to E-A fusion.  2. Right ventricular systolic function is normal. The right ventricular size is normal. There is mildly elevated pulmonary artery systolic pressure.  3. Left atrial size was mildly dilated.  4. The mitral valve is normal in structure. Trivial mitral valve regurgitation. No evidence of mitral stenosis.  5. The aortic valve is normal in structure. Aortic valve regurgitation is not visualized. No aortic stenosis is present.  6. There is a mobile, nonobstructive mass in the proximal inferior vena cava, measuring at least 3 cm in length, roughly 1 cm in diameter, may represent a thrombembolus in transit or extension of renal cell carcinoma. The inferior vena cava is dilated in size with <50% respiratory variability, suggesting right atrial pressure of 15 mmHg. Conclusion(s)/Recommendation(s): No evidence of valvular vegetations on this transthoracic echocardiogram. Would recommend a transesophageal echocardiogram to exclude infective endocarditis if clinically indicated. 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 FINDINGS  Left Ventricle: Left ventricular ejection fraction, by estimation, is 60 to 65%. Left ventricular ejection fraction by 3D volume is 59 %. The left ventricle has normal function. The left ventricle has no regional wall motion abnormalities. The left ventricular internal cavity size was normal in size. There is mild concentric left ventricular hypertrophy. Indeterminate diastolic filling due to E-A fusion. Normal left ventricular filling pressure. Right Ventricle: The right ventricular size is normal. No increase in right ventricular wall thickness. Right ventricular systolic function is normal. There is mildly elevated pulmonary artery systolic pressure. The tricuspid regurgitant velocity is 2.59  m/s, and with an assumed right atrial pressure of 15 mmHg, the estimated right ventricular systolic pressure is 19.1 mmHg. Left Atrium: Left atrial size was mildly dilated. Right Atrium: Right atrial  size was normal in size. Prominent Eustachian valve. Pericardium: There is no evidence of pericardial effusion. Mitral Valve: The mitral valve is normal in structure. Trivial mitral valve regurgitation. No evidence of mitral valve stenosis. Tricuspid Valve: The tricuspid valve is normal in structure. Tricuspid valve regurgitation is trivial. No evidence of tricuspid stenosis. Aortic Valve: The aortic valve is normal in structure. Aortic valve regurgitation is not visualized. No aortic stenosis is present. Pulmonic Valve: The pulmonic valve was normal in structure. Pulmonic valve regurgitation is not visualized. No evidence of pulmonic stenosis. Aorta: The aortic root is normal in size and structure. Venous: There is a mobile, nonobstructive mass in the proximal inferior  vena cava, measuring at least 3 cm in length, roughly 1 cm in diameter, may represent a thrombembolus in transit or extension of renal cell carcinoma. The inferior vena cava is dilated in size with less than 50% respiratory variability, suggesting right atrial pressure of 15 mmHg. IAS/Shunts: No atrial level shunt detected by color flow Doppler.  LEFT VENTRICLE PLAX 2D LVIDd:         3.60 cm LVIDs:         2.20 cm LV PW:         1.25 cm         3D Volume EF LV IVS:        1.20 cm         LV 3D EF:    Left LVOT diam:     2.00 cm                      ventricular LV SV:         79                           ejection LV SV Index:   50                           fraction by LVOT Area:     3.14 cm                     3D volume                                             is 59 %.  LV Volumes (MOD) LV vol d, MOD    75.9 ml       3D Volume EF: A2C:                           3D EF:        59 % LV vol d, MOD    52.0 ml       LV EDV:       113 ml A4C:                           LV ESV:       46 ml LV vol s, MOD    33.7 ml       LV SV:        67 ml A2C: LV vol s, MOD    16.2 ml A4C: LV SV MOD A2C:   42.2 ml LV SV MOD A4C:   52.0 ml LV SV MOD BP:    39.6 ml RIGHT  VENTRICLE             IVC RV S prime:     15.50 cm/s  IVC diam: 2.70 cm TAPSE (M-mode): 2.2 cm LEFT ATRIUM           Index       RIGHT ATRIUM           Index LA diam:      4.30 cm 2.72 cm/m  RA Area:     10.60 cm LA Vol (A2C): 27.7 ml 17.53 ml/m RA Volume:   19.50 ml  12.34 ml/m LA Vol (  A4C): 50.6 ml 32.02 ml/m  AORTIC VALVE LVOT Vmax:   155.00 cm/s LVOT Vmean:  89.300 cm/s LVOT VTI:    0.251 m  AORTA Ao Root diam: 3.10 cm Ao Asc diam:  3.50 cm MITRAL VALVE               TRICUSPID VALVE MV Area (PHT): 9.60 cm    TR Peak grad:   26.8 mmHg MV Decel Time: 79 msec     TR Vmax:        259.00 cm/s MV E velocity: 98.80 cm/s                            SHUNTS                            Systemic VTI:  0.25 m                            Systemic Diam: 2.00 cm Mihai Croitoru MD Electronically signed by Sanda Klein MD Signature Date/Time: 06/09/2020/11:13:00 AM    Final      Subjective: No acute issues or events overnight denies nausea vomiting diarrhea constipation headache fevers or chills  Discharge Exam: Vitals:   06/11/20 2002 06/12/20 0500  BP: (!) 160/66 (!) 154/68  Pulse: (!) 108 (!) 110  Resp: 18 18  Temp: 100.2 F (37.9 C) 98.4 F (36.9 C)  SpO2: 95% 92%   Vitals:   06/11/20 0447 06/11/20 1253 06/11/20 2002 06/12/20 0500  BP: (!) 149/66 (!) 128/58 (!) 160/66 (!) 154/68  Pulse: (!) 109 95 (!) 108 (!) 110  Resp: 16 16 18 18   Temp: (!) 101 F (38.3 C)  100.2 F (37.9 C) 98.4 F (36.9 C)  TempSrc:   Oral   SpO2: 92% 95% 95% 92%  Weight:      Height:       General: Pt is alert, awake, not in acute distress Cardiovascular: RRR, S1/S2 +, no rubs, no gallops Respiratory: No wheezing, rales, or rhonchi Abdominal: Soft, NT, ND, bowel sounds + Extremities: no edema, no cyanosis   The results of significant diagnostics from this hospitalization (including imaging, microbiology, ancillary and laboratory) are listed below for reference.     Microbiology: Recent Results (from the past  240 hour(s))  Blood Culture (routine x 2)     Status: Abnormal   Collection Time: 06/07/20 11:10 AM   Specimen: BLOOD  Result Value Ref Range Status   Specimen Description   Final    BLOOD LEFT ANTECUBITAL Performed at [facility], [place] [address], [place], [place] [address]    Special Requests   Final    BOTTLES DRAWN AEROBIC AND ANAEROBIC Blood Culture adequate volume Performed at [place] [address], [place], [place] [address]    Culture  Setup Time   Final    GRAM POSITIVE COCCI AEROBIC BOTTLE ONLY CRITICAL VALUE NOTED.  VALUE IS CONSISTENT WITH PREVIOUSLY REPORTED AND CALLED VALUE.    Culture (A)  Final    STAPHYLOCOCCUS EPIDERMIDIS SUSCEPTIBILITIES PERFORMED ON PREVIOUS CULTURE WITHIN THE LAST 5 DAYS. Performed at [facility], [place] [address] [place], [place] [address]    Report Status 06/10/2020 FINAL  Final  Blood Culture (routine x 2)     Status: Abnormal (Preliminary result)   Collection Time: 06/07/20 11:10 AM  Specimen: BLOOD  Result Value Ref Range Status   Specimen Description   Final    BLOOD RIGHT ANTECUBITAL Performed at [facility], [place] [address] [place], [place] [address]    Special Requests   Final    BOTTLES DRAWN AEROBIC AND ANAEROBIC Blood Culture adequate volume Performed at [place] [address] [place], [place] [address]    Culture  Setup Time   Final    GRAM POSITIVE COCCI IN BOTH AEROBIC AND ANAEROBIC BOTTLES CRITICAL RESULT CALLED TO, READ BACK BY AND VERIFIED WITH: SAM COBLE RN @1221  06/08/20 EB    Culture (A)  Final    STAPHYLOCOCCUS EPIDERMIDIS STAPHYLOCOCCUS HOMINIS SUSCEPTIBILITIES TO FOLLOW Performed at [facility], [place] [address] [place], [place] [address]    Report Status PENDING  Incomplete   Organism ID, Bacteria STAPHYLOCOCCUS EPIDERMIDIS  Final      Susceptibility   Staphylococcus epidermidis - MIC*    CIPROFLOXACIN <=0.5 SENSITIVE Sensitive      ERYTHROMYCIN >=8 RESISTANT Resistant     GENTAMICIN <=0.5 SENSITIVE Sensitive     OXACILLIN <=0.25 SENSITIVE Sensitive     TETRACYCLINE <=1 SENSITIVE Sensitive     VANCOMYCIN <=0.5 SENSITIVE Sensitive     TRIMETH/SULFA <=10 SENSITIVE Sensitive     CLINDAMYCIN <=0.25 SENSITIVE Sensitive     RIFAMPIN <=0.5 SENSITIVE Sensitive     Inducible Clindamycin NEGATIVE Sensitive     * STAPHYLOCOCCUS EPIDERMIDIS  Blood Culture ID Panel (Reflexed)     Status: Abnormal   Collection Time: 06/07/20 11:10 AM  Result Value Ref Range Status   Enterococcus faecalis NOT DETECTED NOT DETECTED Final   Enterococcus Faecium NOT DETECTED NOT DETECTED Final   Listeria monocytogenes NOT DETECTED NOT DETECTED Final   Staphylococcus species DETECTED (A) NOT DETECTED Final    Comment: CRITICAL RESULT CALLED TO, READ BACK BY AND VERIFIED WITH: SAM COBLE RN @1221  06/08/20 EB    Staphylococcus aureus (BCID) NOT DETECTED NOT DETECTED Final   Staphylococcus epidermidis DETECTED (A) NOT DETECTED Final    Comment: Methicillin (oxacillin) resistant coagulase negative staphylococcus. Possible blood culture contaminant (unless isolated from more than one blood culture draw or clinical case suggests pathogenicity). No antibiotic treatment is indicated for blood  culture contaminants. CRITICAL RESULT CALLED TO, READ BACK BY AND VERIFIED WITH: SAM COBLE RN @1221  06/08/20 EB    Staphylococcus lugdunensis NOT DETECTED NOT DETECTED Final   Streptococcus species NOT DETECTED NOT DETECTED Final   Streptococcus agalactiae NOT DETECTED NOT DETECTED Final   Streptococcus pneumoniae NOT DETECTED NOT DETECTED Final   Streptococcus pyogenes NOT DETECTED NOT DETECTED Final   A.calcoaceticus-baumannii NOT DETECTED NOT DETECTED Final   Bacteroides fragilis NOT DETECTED NOT DETECTED Final   Enterobacterales NOT DETECTED NOT DETECTED Final   Enterobacter cloacae complex NOT DETECTED NOT DETECTED Final   Escherichia coli NOT DETECTED NOT  DETECTED Final   Klebsiella aerogenes NOT DETECTED NOT DETECTED Final   Klebsiella oxytoca NOT DETECTED NOT DETECTED Final   Klebsiella pneumoniae NOT DETECTED NOT DETECTED Final   Proteus species NOT DETECTED NOT DETECTED Final   Salmonella species NOT DETECTED NOT DETECTED Final   Serratia marcescens NOT DETECTED NOT DETECTED Final   Haemophilus influenzae NOT DETECTED NOT DETECTED Final   Neisseria meningitidis NOT DETECTED NOT DETECTED Final   Pseudomonas aeruginosa NOT DETECTED NOT DETECTED Final   Stenotrophomonas maltophilia NOT DETECTED NOT DETECTED Final   Candida albicans NOT DETECTED NOT DETECTED Final   Candida auris  NOT DETECTED NOT DETECTED Final   Candida glabrata NOT DETECTED NOT DETECTED Final   Candida krusei NOT DETECTED NOT DETECTED Final   Candida parapsilosis NOT DETECTED NOT DETECTED Final   Candida tropicalis NOT DETECTED NOT DETECTED Final   Cryptococcus neoformans/gattii NOT DETECTED NOT DETECTED Final   Methicillin resistance mecA/C DETECTED (A) NOT DETECTED Final    Comment: CRITICAL RESULT CALLED TO, READ BACK BY AND VERIFIED WITH: SAM COBLE RN @1221  06/08/20 EB Performed at [place] [address], [place], [place] [address]   Resp Panel by RT-PCR (Flu A&B, Covid) Nasopharyngeal Swab     Status: None   Collection Time: 06/07/20 12:08 PM   Specimen: Nasopharyngeal Swab; Nasopharyngeal(NP) swabs in vial transport medium  Result Value Ref Range Status   SARS Coronavirus 2 by RT PCR NEGATIVE NEGATIVE Final    Comment: (NOTE) SARS-CoV-2 target nucleic acids are NOT DETECTED.  The SARS-CoV-2 RNA is generally detectable in upper respiratory specimens during the acute phase of infection. The lowest concentration of SARS-CoV-2 viral copies this assay can detect is 138 copies/mL. A negative result does not preclude SARS-Cov-2 infection and should not be used as the sole basis for treatment or other patient management decisions. A negative result may  occur with  improper specimen collection/handling, submission of specimen other than nasopharyngeal swab, presence of viral mutation(s) within the areas targeted by this assay, and inadequate number of viral copies(<138 copies/mL). A negative result must be combined with clinical observations, patient history, and epidemiological information. The expected result is Negative.  Fact Sheet for Patients:  EntrepreneurPulse.com.au  Fact Sheet for Healthcare Providers:  IncredibleEmployment.be  This test is no t yet approved or cleared by the [place] FDA and  has been authorized for detection and/or diagnosis of SARS-CoV-2 by FDA under an Emergency Use Authorization (EUA). This EUA will remain  in effect (meaning this test can be used) for the duration of the COVID-19 declaration under Section 564(b)(1) of the Act, 21 U.S.C.section 360bbb-3(b)(1), unless the authorization is terminated  or revoked sooner.       Influenza A by PCR NEGATIVE NEGATIVE Final   Influenza B by PCR NEGATIVE NEGATIVE Final    Comment: (NOTE) The Xpert Xpress SARS-CoV-2/FLU/RSV plus assay is intended as an aid in the diagnosis of influenza from Nasopharyngeal swab specimens and should not be used as a sole basis for treatment. Nasal washings and aspirates are unacceptable for Xpert Xpress SARS-CoV-2/FLU/RSV testing.  Fact Sheet for Patients: EntrepreneurPulse.com.au  Fact Sheet for Healthcare Providers: IncredibleEmployment.be  This test is not yet approved or cleared by the [place] FDA and has been authorized for detection and/or diagnosis of SARS-CoV-2 by FDA under an Emergency Use Authorization (EUA). This EUA will remain in effect (meaning this test can be used) for the duration of the COVID-19 declaration under Section 564(b)(1) of the Act, 21 U.S.C. section 360bbb-3(b)(1), unless the authorization is terminated  or revoked.  Performed at [facility], [place] [address], [place], [place] [address]   Urine culture     Status: None   Collection Time: 06/07/20  1:34 PM   Specimen: In/Out Cath Urine  Result Value Ref Range Status   Specimen Description   Final    IN/OUT CATH URINE Performed at [place] [address], [place], [place] [address]    Special Requests   Final    NONE Performed at [facility], [place] [address] [place], [place] [address]  Culture   Final    NO GROWTH Performed at [facility], [place] [address] [place], [place] [address]    Report Status 06/08/2020 FINAL  Final  Blood culture (routine x 2)     Status: None (Preliminary result)   Collection Time: 06/08/20  4:09 PM   Specimen: BLOOD LEFT FOREARM  Result Value Ref Range Status   Specimen Description   Final    BLOOD LEFT FOREARM Performed at [place] [address], [place], [place] [address]    Special Requests   Final    BOTTLES DRAWN AEROBIC AND ANAEROBIC Blood Culture results may not be optimal due to an inadequate volume of blood received in culture bottles Performed at [place] [address] [place], [place] [address]    Culture   Final    NO GROWTH 4 DAYS Performed at [facility], [place] [address], [place], [place] [address]    Report Status PENDING  Incomplete  Blood culture (routine x 2)     Status: None (Preliminary result)   Collection Time: 06/08/20  4:14 PM   Specimen: BLOOD RIGHT FOREARM  Result Value Ref Range Status   Specimen Description   Final    BLOOD RIGHT FOREARM Performed at [place] [address], [place], [place] [address]    Special Requests   Final    BOTTLES DRAWN AEROBIC ONLY Blood Culture results may not be optimal due to an inadequate volume of blood received in culture bottles Performed at [place]  [address], [place], [place] [address]    Culture   Final    NO GROWTH 4 DAYS Performed at [facility], [place] [address] [place], [place] [address]    Report Status PENDING  Incomplete     Labs: BNP (last 3 results) Recent Labs    06/10/20 0334  BNP 163.8*   Basic Metabolic Panel: Recent Labs  Lab 06/08/20 2357 06/09/20 0341 06/10/20 0334 06/10/20 0401 06/11/20 0354 06/12/20 0419  NA 133* 134* 134*  --  132* 132*  K 4.3 3.8 3.9  --  4.2 4.2  CL 101 103 102  --  101 100  CO2 23 22 23   --  24 25  GLUCOSE 99 87 106*  --  103* 156*  BUN 14 14 13   --  11 12  CREATININE 0.91 0.97 0.77  --  0.67 0.71  CALCIUM 8.7* 8.5* 8.4*  --  8.4* 8.8*  MG  --   --   --  1.6*  --   --    Liver Function Tests: Recent Labs  Lab 06/07/20 1110 06/09/20 0341  AST 99* 72*  ALT 28 25  ALKPHOS 93 79  BILITOT 0.6 0.8  PROT 6.7 5.9*  ALBUMIN 2.9* 2.4*   No results for input(s): LIPASE, AMYLASE in the last 168 hours. No results for input(s): AMMONIA in the last 168 hours. CBC: Recent Labs  Lab 06/07/20 1110 06/08/20 1609 06/08/20 1634 06/09/20 0341 06/10/20 0334 06/11/20 0354 06/12/20 0419  WBC 11.8* 13.0*  --  11.2* 10.9* 10.2 10.6*  NEUTROABS 9.9* 9.9*  --   --   --   --   --   HGB 9.0* 9.6* 10.2* 7.7* 8.7* 7.6* 7.6*  HCT 29.5* 32.2* 30.0* 25.7* 29.8* 25.6* 25.3*  MCV 78.5* 81.1  --  80.1 82.3 81.5 80.1  PLT 278 298  --  260 296  298 297   Cardiac Enzymes: No results for input(s): CKTOTAL, CKMB, CKMBINDEX, TROPONINI in the last 168 hours. BNP: Invalid input(s): POCBNP CBG: No results for input(s): GLUCAP in the last 168 hours. D-Dimer No results for input(s): DDIMER in the last 72 hours. Hgb A1c No results for input(s): HGBA1C in the last 72 hours. Lipid Profile No results for input(s): CHOL, HDL, LDLCALC, TRIG, CHOLHDL, LDLDIRECT in the last 72 hours. Thyroid function studies No results for input(s): TSH, T4TOTAL, T3FREE, THYROIDAB in the last 72 hours.  Invalid  input(s): FREET3 Anemia work up No results for input(s): VITAMINB12, FOLATE, FERRITIN, TIBC, IRON, RETICCTPCT in the last 72 hours. Urinalysis    Component Value Date/Time   COLORURINE YELLOW 06/08/2020 2005   APPEARANCEUR CLEAR 06/08/2020 2005   LABSPEC 1.015 06/08/2020 2005   PHURINE 5.0 06/08/2020 2005   GLUCOSEU NEGATIVE 06/08/2020 2005   HGBUR MODERATE (A) 06/08/2020 2005   BILIRUBINUR NEGATIVE 06/08/2020 2005   KETONESUR NEGATIVE 06/08/2020 2005   PROTEINUR NEGATIVE 06/08/2020 2005   NITRITE NEGATIVE 06/08/2020 2005   LEUKOCYTESUR TRACE (A) 06/08/2020 2005   Sepsis Labs Invalid input(s): PROCALCITONIN,  WBC,  LACTICIDVEN Microbiology Recent Results (from the past 240 hour(s))  Blood Culture (routine x 2)     Status: Abnormal   Collection Time: 06/07/20 11:10 AM   Specimen: BLOOD  Result Value Ref Range Status   Specimen Description   Final    BLOOD LEFT ANTECUBITAL Performed at [facility], [place] [address] [place], [place] [address]    Special Requests   Final    BOTTLES DRAWN AEROBIC AND ANAEROBIC Blood Culture adequate volume Performed at [place] [address], [place], [place] [address]    Culture  Setup Time   Final    GRAM POSITIVE COCCI AEROBIC BOTTLE ONLY CRITICAL VALUE NOTED.  VALUE IS CONSISTENT WITH PREVIOUSLY REPORTED AND CALLED VALUE.    Culture (A)  Final    STAPHYLOCOCCUS EPIDERMIDIS SUSCEPTIBILITIES PERFORMED ON PREVIOUS CULTURE WITHIN THE LAST 5 DAYS. Performed at [facility], [place] [address] [place], [place] [address]    Report Status 06/10/2020 FINAL  Final  Blood Culture (routine x 2)     Status: Abnormal (Preliminary result)   Collection Time: 06/07/20 11:10 AM   Specimen: BLOOD  Result Value Ref Range Status   Specimen Description   Final    BLOOD RIGHT ANTECUBITAL Performed at [facility], [place] [address] [place], [place] [address]    Special Requests   Final    BOTTLES DRAWN AEROBIC AND  ANAEROBIC Blood Culture adequate volume Performed at [place] [address] [place], [place] [address]    Culture  Setup Time   Final    GRAM POSITIVE COCCI IN BOTH AEROBIC AND ANAEROBIC BOTTLES CRITICAL RESULT CALLED TO, READ BACK BY AND VERIFIED WITH: SAM COBLE RN @1221  06/08/20 EB    Culture (A)  Final    STAPHYLOCOCCUS EPIDERMIDIS STAPHYLOCOCCUS HOMINIS SUSCEPTIBILITIES TO FOLLOW Performed at [facility], [place] [address], [place], [place] [address]    Report Status PENDING  Incomplete   Organism ID, Bacteria STAPHYLOCOCCUS EPIDERMIDIS  Final      Susceptibility   Staphylococcus epidermidis - MIC*    CIPROFLOXACIN <=0.5 SENSITIVE Sensitive     ERYTHROMYCIN >=8 RESISTANT Resistant     GENTAMICIN <=0.5 SENSITIVE Sensitive     OXACILLIN <=0.25 SENSITIVE Sensitive     TETRACYCLINE <=1 SENSITIVE Sensitive  VANCOMYCIN <=0.5 SENSITIVE Sensitive     TRIMETH/SULFA <=10 SENSITIVE Sensitive     CLINDAMYCIN <=0.25 SENSITIVE Sensitive     RIFAMPIN <=0.5 SENSITIVE Sensitive     Inducible Clindamycin NEGATIVE Sensitive     * STAPHYLOCOCCUS EPIDERMIDIS  Blood Culture ID Panel (Reflexed)     Status: Abnormal   Collection Time: 06/07/20 11:10 AM  Result Value Ref Range Status   Enterococcus faecalis NOT DETECTED NOT DETECTED Final   Enterococcus Faecium NOT DETECTED NOT DETECTED Final   Listeria monocytogenes NOT DETECTED NOT DETECTED Final   Staphylococcus species DETECTED (A) NOT DETECTED Final    Comment: CRITICAL RESULT CALLED TO, READ BACK BY AND VERIFIED WITH: SAM COBLE RN @1221  06/08/20 EB    Staphylococcus aureus (BCID) NOT DETECTED NOT DETECTED Final   Staphylococcus epidermidis DETECTED (A) NOT DETECTED Final    Comment: Methicillin (oxacillin) resistant coagulase negative staphylococcus. Possible blood culture contaminant (unless isolated from more than one blood culture draw or clinical case suggests pathogenicity). No antibiotic treatment is  indicated for blood  culture contaminants. CRITICAL RESULT CALLED TO, READ BACK BY AND VERIFIED WITH: SAM COBLE RN @1221  06/08/20 EB    Staphylococcus lugdunensis NOT DETECTED NOT DETECTED Final   Streptococcus species NOT DETECTED NOT DETECTED Final   Streptococcus agalactiae NOT DETECTED NOT DETECTED Final   Streptococcus pneumoniae NOT DETECTED NOT DETECTED Final   Streptococcus pyogenes NOT DETECTED NOT DETECTED Final   A.calcoaceticus-baumannii NOT DETECTED NOT DETECTED Final   Bacteroides fragilis NOT DETECTED NOT DETECTED Final   Enterobacterales NOT DETECTED NOT DETECTED Final   Enterobacter cloacae complex NOT DETECTED NOT DETECTED Final   Escherichia coli NOT DETECTED NOT DETECTED Final   Klebsiella aerogenes NOT DETECTED NOT DETECTED Final   Klebsiella oxytoca NOT DETECTED NOT DETECTED Final   Klebsiella pneumoniae NOT DETECTED NOT DETECTED Final   Proteus species NOT DETECTED NOT DETECTED Final   Salmonella species NOT DETECTED NOT DETECTED Final   Serratia marcescens NOT DETECTED NOT DETECTED Final   Haemophilus influenzae NOT DETECTED NOT DETECTED Final   Neisseria meningitidis NOT DETECTED NOT DETECTED Final   Pseudomonas aeruginosa NOT DETECTED NOT DETECTED Final   Stenotrophomonas maltophilia NOT DETECTED NOT DETECTED Final   Candida albicans NOT DETECTED NOT DETECTED Final   Candida auris NOT DETECTED NOT DETECTED Final   Candida glabrata NOT DETECTED NOT DETECTED Final   Candida krusei NOT DETECTED NOT DETECTED Final   Candida parapsilosis NOT DETECTED NOT DETECTED Final   Candida tropicalis NOT DETECTED NOT DETECTED Final   Cryptococcus neoformans/gattii NOT DETECTED NOT DETECTED Final   Methicillin resistance mecA/C DETECTED (A) NOT DETECTED Final    Comment: CRITICAL RESULT CALLED TO, READ BACK BY AND VERIFIED WITH: SAM COBLE RN @1221  06/08/20 EB Performed at [facility], [address] [place], [place] [address]   Resp Panel by RT-PCR (Flu A&B, Covid)  Nasopharyngeal Swab     Status: None   Collection Time: 06/07/20 12:08 PM   Specimen: Nasopharyngeal Swab; Nasopharyngeal(NP) swabs in vial transport medium  Result Value Ref Range Status   SARS Coronavirus 2 by RT PCR NEGATIVE NEGATIVE Final    Comment: (NOTE) SARS-CoV-2 target nucleic acids are NOT DETECTED.  The SARS-CoV-2 RNA is generally detectable in upper respiratory specimens during the acute phase of infection. The lowest concentration of SARS-CoV-2 viral copies this assay can detect is 138 copies/mL. A negative result does not preclude SARS-Cov-2 infection and should not be used as the sole basis for treatment  or other patient management decisions. A negative result may occur with  improper specimen collection/handling, submission of specimen other than nasopharyngeal swab, presence of viral mutation(s) within the areas targeted by this assay, and inadequate number of viral copies(<138 copies/mL). A negative result must be combined with clinical observations, patient history, and epidemiological information. The expected result is Negative.  Fact Sheet for Patients:  EntrepreneurPulse.com.au  Fact Sheet for Healthcare Providers:  IncredibleEmployment.be  This test is no t yet approved or cleared by the [place] FDA and  has been authorized for detection and/or diagnosis of SARS-CoV-2 by FDA under an Emergency Use Authorization (EUA). This EUA will remain  in effect (meaning this test can be used) for the duration of the COVID-19 declaration under Section 564(b)(1) of the Act, 21 U.S.C.section 360bbb-3(b)(1), unless the authorization is terminated  or revoked sooner.       Influenza A by PCR NEGATIVE NEGATIVE Final   Influenza B by PCR NEGATIVE NEGATIVE Final    Comment: (NOTE) The Xpert Xpress SARS-CoV-2/FLU/RSV plus assay is intended as an aid in the diagnosis of influenza from Nasopharyngeal swab specimens and should not be  used as a sole basis for treatment. Nasal washings and aspirates are unacceptable for Xpert Xpress SARS-CoV-2/FLU/RSV testing.  Fact Sheet for Patients: EntrepreneurPulse.com.au  Fact Sheet for Healthcare Providers: IncredibleEmployment.be  This test is not yet approved or cleared by the [place] FDA and has been authorized for detection and/or diagnosis of SARS-CoV-2 by FDA under an Emergency Use Authorization (EUA). This EUA will remain in effect (meaning this test can be used) for the duration of the COVID-19 declaration under Section 564(b)(1) of the Act, 21 U.S.C. section 360bbb-3(b)(1), unless the authorization is terminated or revoked.  Performed at [facility], [place] [address] [place], [place] [address]   Urine culture     Status: None   Collection Time: 06/07/20  1:34 PM   Specimen: In/Out Cath Urine  Result Value Ref Range Status   Specimen Description   Final    IN/OUT CATH URINE Performed at [place] [address], [place], [place] [address]    Special Requests   Final    NONE Performed at [facility], [place] [address], [place], [place] [address]    Culture   Final    NO GROWTH Performed at [facility], [place] [address], [place], [place] [address]    Report Status 06/08/2020 FINAL  Final  Blood culture (routine x 2)     Status: None (Preliminary result)   Collection Time: 06/08/20  4:09 PM   Specimen: BLOOD LEFT FOREARM  Result Value Ref Range Status   Specimen Description   Final    BLOOD LEFT FOREARM Performed at [place] [address] [place], [place] [address]    Special Requests   Final    BOTTLES DRAWN AEROBIC AND ANAEROBIC Blood Culture results may not be optimal due to an inadequate volume of blood received in culture bottles Performed at [place] [address] [place], [place] [address]     Culture   Final    NO GROWTH 4 DAYS Performed at [facility], [place] [address], [place], [place] [address]    Report Status PENDING  Incomplete  Blood culture (routine x 2)     Status: None (Preliminary result)   Collection Time: 06/08/20  4:14 PM   Specimen: BLOOD RIGHT FOREARM  Result Value  Ref Range Status   Specimen Description   Final    BLOOD RIGHT FOREARM Performed at [place] [address], [place], [place] [address]    Special Requests   Final    BOTTLES DRAWN AEROBIC ONLY Blood Culture results may not be optimal due to an inadequate volume of blood received in culture bottles Performed at [place] [address] [place], [place] [address]    Culture   Final    NO GROWTH 4 DAYS Performed at [facility], [place] [address] [place], [place] [address]    Report Status PENDING  Incomplete   Time coordinating discharge: Over 30 minutes  SIGNED:  Little Ishikawa, DO [facility] 06/12/2020, 10:41 AM Pager   If 7PM-7AM, please contact night-coverage [email or web address]

## 2020-06-12 NOTE — Progress Notes (Signed)
Pharmacy Antibiotic Note  DMYA LONG is a 60 y.o. female with metastatic renal cell carcinoma on axitinib and pembrolizumab PTA who was instructed to come to the ED on 06/08/2020 after blood culture was positive for staph. She's currently on vancomycin for staph bacteremia.  Today, 06/12/2020: - day #4 abx - Tmax 100.2, wbc 10.6 - scr stable at 0.71 (crcl~63) - ID recom that pt can be transitioned to PO linezolid for 7-10 days of total abx  Plan: - continue vancomycin 1000 mg IV q24h for now - f/u culture  ______________________________________________  Height: 5\' 4"  (162.6 cm) Weight: 54.9 kg (121 lb) IBW/kg (Calculated) : 54.7  Temp (24hrs), Avg:99.3 F (37.4 C), Min:98.4 F (36.9 C), Max:100.2 F (37.9 C)  Recent Labs  Lab 06/07/20 1110 06/07/20 1324 06/08/20 1609 06/08/20 1634 06/08/20 1809 06/08/20 2357 06/09/20 0341 06/10/20 0334 06/11/20 0354 06/12/20 0419  WBC 11.8*  --  13.0*  --   --   --  11.2* 10.9* 10.2 10.6*  CREATININE 0.83  --   --    < >  --  0.91 0.97 0.77 0.67 0.71  LATICACIDVEN 1.5 1.7 2.0*  --  2.0*  --   --   --   --   --    < > = values in this interval not displayed.    Estimated Creatinine Clearance: 63 mL/min (by C-G formula based on SCr of 0.71 mg/dL).    Allergies  Allergen Reactions  . Amlodipine     Other reaction(s): dizziness  . Crestor [Rosuvastatin] Other (See Comments)    Muscle cramps    Antimicrobials this admission: 5/6 vancomycin >>   Microbiology results: 5/5 UCx: ng-final 5/5 BCx: 3/4 bottles growing MRSE, staph epi (R= erythro) and staph hominis (mecA detected on BCID, but not on culture) 5/6 rpt BCx x2:   Thank you for allowing pharmacy to be a part of this patient's care.  Lynelle Doctor 06/12/2020 7:42 AM

## 2020-06-12 NOTE — TOC Progression Note (Signed)
Transition of Care [facility]) - Progression Note    Patient Details  Name: [name] MRN: [id number] Date of Birth: [birth year]  Transition of Care [facility] CM/SW Contact  Purcell Mouton, RN Phone Number: 06/12/2020, 11:31 AM  Clinical Narrative:     Plan to discharge home with no needs.   Expected Discharge Plan: Home/Self Care Barriers to Discharge: No Barriers Identified  Expected Discharge Plan and Services Expected Discharge Plan: Home/Self Care       Living arrangements for the past 2 months: Single Family Home Expected Discharge Date: 06/12/20                                     Social Determinants of Health (SDOH) Interventions    Readmission Risk Interventions No flowsheet data found.

## 2020-06-13 ENCOUNTER — Other Ambulatory Visit (HOSPITAL_COMMUNITY): Payer: BC Managed Care – PPO

## 2020-06-13 LAB — CULTURE, BLOOD (ROUTINE X 2)
Culture: NO GROWTH
Culture: NO GROWTH
Special Requests: ADEQUATE

## 2020-06-14 ENCOUNTER — Telehealth: Payer: Self-pay | Admitting: *Deleted

## 2020-06-15 ENCOUNTER — Other Ambulatory Visit: Payer: Self-pay

## 2020-06-15 ENCOUNTER — Encounter (HOSPITAL_COMMUNITY): Payer: Self-pay | Admitting: Cardiology

## 2020-06-15 ENCOUNTER — Emergency Department (HOSPITAL_COMMUNITY): Payer: BC Managed Care – PPO

## 2020-06-15 ENCOUNTER — Inpatient Hospital Stay (HOSPITAL_COMMUNITY)
Admission: EM | Admit: 2020-06-15 | Discharge: 2020-07-04 | Disposition: E | Payer: BC Managed Care – PPO | Source: Home / Self Care | Attending: Family Medicine | Admitting: Family Medicine

## 2020-06-15 DIAGNOSIS — C641 Malignant neoplasm of right kidney, except renal pelvis: Secondary | ICD-10-CM | POA: Diagnosis present

## 2020-06-15 DIAGNOSIS — E785 Hyperlipidemia, unspecified: Secondary | ICD-10-CM | POA: Diagnosis present

## 2020-06-15 DIAGNOSIS — C3490 Malignant neoplasm of unspecified part of unspecified bronchus or lung: Secondary | ICD-10-CM | POA: Diagnosis present

## 2020-06-15 DIAGNOSIS — Z87891 Personal history of nicotine dependence: Secondary | ICD-10-CM

## 2020-06-15 DIAGNOSIS — Z1611 Resistance to penicillins: Secondary | ICD-10-CM | POA: Diagnosis present

## 2020-06-15 DIAGNOSIS — Z888 Allergy status to other drugs, medicaments and biological substances status: Secondary | ICD-10-CM

## 2020-06-15 DIAGNOSIS — Z8249 Family history of ischemic heart disease and other diseases of the circulatory system: Secondary | ICD-10-CM

## 2020-06-15 DIAGNOSIS — Z515 Encounter for palliative care: Secondary | ICD-10-CM

## 2020-06-15 DIAGNOSIS — Z86711 Personal history of pulmonary embolism: Secondary | ICD-10-CM

## 2020-06-15 DIAGNOSIS — I472 Ventricular tachycardia, unspecified: Secondary | ICD-10-CM

## 2020-06-15 DIAGNOSIS — Z79899 Other long term (current) drug therapy: Secondary | ICD-10-CM

## 2020-06-15 DIAGNOSIS — Z20822 Contact with and (suspected) exposure to covid-19: Secondary | ICD-10-CM | POA: Diagnosis present

## 2020-06-15 DIAGNOSIS — D630 Anemia in neoplastic disease: Secondary | ICD-10-CM | POA: Diagnosis present

## 2020-06-15 DIAGNOSIS — R569 Unspecified convulsions: Secondary | ICD-10-CM | POA: Diagnosis present

## 2020-06-15 DIAGNOSIS — Z7901 Long term (current) use of anticoagulants: Secondary | ICD-10-CM

## 2020-06-15 DIAGNOSIS — D649 Anemia, unspecified: Secondary | ICD-10-CM | POA: Diagnosis present

## 2020-06-15 DIAGNOSIS — Z66 Do not resuscitate: Secondary | ICD-10-CM | POA: Diagnosis present

## 2020-06-15 DIAGNOSIS — I2699 Other pulmonary embolism without acute cor pulmonale: Secondary | ICD-10-CM | POA: Diagnosis present

## 2020-06-15 DIAGNOSIS — I4721 Torsades de pointes: Secondary | ICD-10-CM

## 2020-06-15 DIAGNOSIS — C649 Malignant neoplasm of unspecified kidney, except renal pelvis: Secondary | ICD-10-CM | POA: Diagnosis present

## 2020-06-15 DIAGNOSIS — I10 Essential (primary) hypertension: Secondary | ICD-10-CM | POA: Diagnosis present

## 2020-06-15 DIAGNOSIS — Z8673 Personal history of transient ischemic attack (TIA), and cerebral infarction without residual deficits: Secondary | ICD-10-CM

## 2020-06-15 DIAGNOSIS — R7881 Bacteremia: Secondary | ICD-10-CM | POA: Diagnosis present

## 2020-06-15 DIAGNOSIS — G43909 Migraine, unspecified, not intractable, without status migrainosus: Secondary | ICD-10-CM | POA: Diagnosis present

## 2020-06-15 HISTORY — DX: Bacteremia: R78.81

## 2020-06-15 HISTORY — DX: Migraine, unspecified, not intractable, without status migrainosus: G43.909

## 2020-06-15 HISTORY — DX: Essential (primary) hypertension: I10

## 2020-06-15 HISTORY — DX: Malignant neoplasm of unspecified kidney, except renal pelvis: C64.9

## 2020-06-15 HISTORY — DX: Other pulmonary embolism without acute cor pulmonale: I26.99

## 2020-06-15 HISTORY — DX: Methicillin resistant Staphylococcus aureus infection as the cause of diseases classified elsewhere: B95.62

## 2020-06-15 LAB — TROPONIN I (HIGH SENSITIVITY): Troponin I (High Sensitivity): 129 ng/L (ref <18)

## 2020-06-15 LAB — CBC WITH DIFFERENTIAL/PLATELET
Abs Immature Granulocytes: 0.29 10*3/uL — ABNORMAL HIGH (ref 0.00–0.07)
Basophils Absolute: 0.2 10*3/uL — ABNORMAL HIGH (ref 0.0–0.1)
Basophils Relative: 1 %
Eosinophils Absolute: 0 10*3/uL (ref 0.0–0.5)
Eosinophils Relative: 0 %
HCT: 32.9 % — ABNORMAL LOW (ref 36.0–46.0)
Hemoglobin: 9.7 g/dL — ABNORMAL LOW (ref 12.0–15.0)
Immature Granulocytes: 2 %
Lymphocytes Relative: 7 %
Lymphs Abs: 1 10*3/uL (ref 0.7–4.0)
MCH: 24 pg — ABNORMAL LOW (ref 26.0–34.0)
MCHC: 29.5 g/dL — ABNORMAL LOW (ref 30.0–36.0)
MCV: 81.2 fL (ref 80.0–100.0)
Monocytes Absolute: 1.4 10*3/uL — ABNORMAL HIGH (ref 0.1–1.0)
Monocytes Relative: 9 %
Neutro Abs: 12.4 10*3/uL — ABNORMAL HIGH (ref 1.7–7.7)
Neutrophils Relative %: 81 %
Platelets: 452 10*3/uL — ABNORMAL HIGH (ref 150–400)
RBC: 4.05 MIL/uL (ref 3.87–5.11)
RDW: 15.5 % (ref 11.5–15.5)
WBC: 15.3 10*3/uL — ABNORMAL HIGH (ref 4.0–10.5)
nRBC: 0 % (ref 0.0–0.2)

## 2020-06-15 LAB — COMPREHENSIVE METABOLIC PANEL
ALT: 32 U/L (ref 0–44)
AST: 63 U/L — ABNORMAL HIGH (ref 15–41)
Albumin: 2.3 g/dL — ABNORMAL LOW (ref 3.5–5.0)
Alkaline Phosphatase: 133 U/L — ABNORMAL HIGH (ref 38–126)
Anion gap: 16 — ABNORMAL HIGH (ref 5–15)
BUN: 9 mg/dL (ref 8–23)
CO2: 21 mmol/L — ABNORMAL LOW (ref 22–32)
Calcium: 8.8 mg/dL — ABNORMAL LOW (ref 8.9–10.3)
Chloride: 96 mmol/L — ABNORMAL LOW (ref 98–111)
Creatinine, Ser: 1.16 mg/dL — ABNORMAL HIGH (ref 0.44–1.00)
GFR, Estimated: 53 mL/min — ABNORMAL LOW (ref >60)
Glucose, Bld: 216 mg/dL — ABNORMAL HIGH (ref 70–99)
Potassium: 4.4 mmol/L (ref 3.5–5.1)
Sodium: 133 mmol/L — ABNORMAL LOW (ref 135–145)
Total Bilirubin: 1 mg/dL (ref 0.3–1.2)
Total Protein: 6.3 g/dL — ABNORMAL LOW (ref 6.5–8.1)

## 2020-06-15 LAB — I-STAT CHEM 8, ED
BUN: 12 mg/dL (ref 8–23)
Calcium, Ion: 1.05 mmol/L — ABNORMAL LOW (ref 1.15–1.40)
Chloride: 97 mmol/L — ABNORMAL LOW (ref 98–111)
Creatinine, Ser: 0.9 mg/dL (ref 0.44–1.00)
Glucose, Bld: 214 mg/dL — ABNORMAL HIGH (ref 70–99)
HCT: 31 % — ABNORMAL LOW (ref 36.0–46.0)
Hemoglobin: 10.5 g/dL — ABNORMAL LOW (ref 12.0–15.0)
Potassium: 4.5 mmol/L (ref 3.5–5.1)
Sodium: 131 mmol/L — ABNORMAL LOW (ref 135–145)
TCO2: 22 mmol/L (ref 22–32)

## 2020-06-15 LAB — LACTIC ACID, PLASMA: Lactic Acid, Venous: 5.9 mmol/L (ref 0.5–1.9)

## 2020-06-15 LAB — RESP PANEL BY RT-PCR (FLU A&B, COVID) ARPGX2
Influenza A by PCR: NEGATIVE
Influenza B by PCR: NEGATIVE
SARS Coronavirus 2 by RT PCR: NEGATIVE

## 2020-06-15 LAB — PHOSPHORUS: Phosphorus: 4.5 mg/dL (ref 2.5–4.6)

## 2020-06-15 LAB — BRAIN NATRIURETIC PEPTIDE: B Natriuretic Peptide: 1726.1 pg/mL — ABNORMAL HIGH (ref 0.0–100.0)

## 2020-06-15 LAB — MAGNESIUM: Magnesium: 1.8 mg/dL (ref 1.7–2.4)

## 2020-06-15 MED ORDER — AMIODARONE HCL IN DEXTROSE 360-4.14 MG/200ML-% IV SOLN
60.0000 mg/h | INTRAVENOUS | Status: AC
  Administered 2020-06-15: 60 mg/h via INTRAVENOUS

## 2020-06-15 MED ORDER — LIDOCAINE BOLUS VIA INFUSION: 100.0000 mg | Freq: Once | INTRAVENOUS | Status: DC

## 2020-06-15 MED ORDER — HYDROCODONE-ACETAMINOPHEN 5-325 MG PO TABS
1.0000 | ORAL_TABLET | ORAL | Status: DC | PRN
Start: 2020-06-15 — End: 2020-06-16
  Administered 2020-06-16: 1 via ORAL
  Filled 2020-06-15: qty 1

## 2020-06-15 MED ORDER — LIDOCAINE IN D5W 4-5 MG/ML-% IV SOLN: 2.0000 mg/min | INTRAVENOUS | Status: DC

## 2020-06-15 MED ORDER — MAGNESIUM SULFATE 2 GM/50ML IV SOLN
2.0000 g | INTRAVENOUS | Status: AC
  Administered 2020-06-15: 2 g via INTRAVENOUS
  Filled 2020-06-15: qty 50

## 2020-06-15 MED ORDER — FUROSEMIDE 10 MG/ML IJ SOLN
40.0000 mg | Freq: Once | INTRAMUSCULAR | Status: AC
  Administered 2020-06-15: 40 mg via INTRAVENOUS
  Filled 2020-06-15: qty 4

## 2020-06-15 MED ORDER — AMIODARONE HCL IN DEXTROSE 360-4.14 MG/200ML-% IV SOLN
30.0000 mg/h | INTRAVENOUS | Status: DC
  Filled 2020-06-15: qty 200

## 2020-06-15 MED ORDER — APIXABAN 5 MG PO TABS
5.0000 mg | ORAL_TABLET | Freq: Two times a day (BID) | ORAL | Status: DC
  Administered 2020-06-15: 5 mg via ORAL
  Filled 2020-06-15 (×2): qty 1

## 2020-06-15 MED ORDER — AMIODARONE HCL IN DEXTROSE 360-4.14 MG/200ML-% IV SOLN
INTRAVENOUS | Status: AC
  Administered 2020-06-15: 60 mg/h via INTRAVENOUS
  Filled 2020-06-15: qty 200

## 2020-06-15 MED ORDER — VANCOMYCIN HCL 1000 MG/200ML IV SOLN
1000.0000 mg | INTRAVENOUS | Status: DC
  Administered 2020-06-16: 1000 mg via INTRAVENOUS
  Filled 2020-06-15: qty 200

## 2020-06-15 MED ORDER — LIDOCAINE IN D5W 4-5 MG/ML-% IV SOLN
2.0000 mg/min | INTRAVENOUS | Status: DC
  Administered 2020-06-15: 2 mg/min via INTRAVENOUS
  Filled 2020-06-15: qty 500

## 2020-06-15 MED ORDER — LIDOCAINE BOLUS VIA INFUSION
100.0000 mg | Freq: Once | INTRAVENOUS | Status: DC
  Filled 2020-06-15: qty 100

## 2020-06-15 MED ORDER — AMIODARONE IV BOLUS ONLY 150 MG/100ML
150.0000 mg | Freq: Once | INTRAVENOUS | Status: AC
  Administered 2020-06-15: 150 mg via INTRAVENOUS
  Filled 2020-06-15: qty 100

## 2020-06-15 NOTE — ED Notes (Signed)
Tried to give report, Brayton Layman said she will call back.

## 2020-06-15 NOTE — ED Notes (Signed)
Pt went into V Tach and was synchronized cardioverted while still on EMS's monitor @200J 

## 2020-06-15 NOTE — ED Provider Notes (Signed)
[facility] EMERGENCY DEPARTMENT Provider Note   CSN: [id number] Arrival date & time: 06/05/2020  1822     History Chief Complaint  Patient presents with  . sycope/ V-Tach    Pt has runs of V-tach with pulses.     [name] is a 60 y.o. female hx of metastatic renal cell carcinoma, recent bacteremia here presenting with V. tach.  Patient has MRSA bacteremia and was on linezolid.  She was just discharged 3 days ago.  She states that she had feeling lightheaded and dizzy today.  Apparently she was noted to have seizure-like activity but was awake.  EMS put her on a monitor and she was noted to be in V. tach.  It was spontaneous resolve until she came to the ER.  She went back into V. tach and shocked twice by EMS.  She was also given amiodarone 150 mg by EMS.  Per discussion in the hospital, patient is DNR.  The history is provided by the patient.       Past Medical History:  Diagnosis Date  . Essential hypertension   . Metastatic renal cell carcinoma ([place])   . Migraine   . MRSA bacteremia   . Pulmonary embolus ([place])   . Stroke ([place])   . Vaginal prolapse     Patient Active Problem List   Diagnosis Date Noted  . Palliative care patient 06/12/2020  . Hyponatremia 06/09/2020  . Hyperkalemia 06/09/2020  . HTN (hypertension) 06/09/2020  . Pressure injury of skin 06/09/2020  . Bacteremia 06/08/2020  . Hypercalcemia 06/05/2020  . CVA (cerebral vascular accident) ([place]) 05/28/2020  . Metastatic renal cell carcinoma ([place]) 05/07/2020  . Pulmonary embolism ([place]) 05/07/2020  . Normocytic normochromic anemia 04/23/2020  . Weight loss, abnormal 04/23/2020  . Vaginal prolapse 11/13/2011  . Cystocele 11/13/2011    Past Surgical History:  Procedure Laterality Date  . ABDOMINAL HYSTERECTOMY    . CYSTOCELE REPAIR    . RECTOCELE REPAIR    . TUBAL LIGATION       OB History   No obstetric history on file.     Family History  Problem Relation Age of Onset   . COPD Mother   . Hypertension Mother   . Leukemia Father        Chronic mylemonocytic leukemia  . Colon cancer Paternal Grandfather     Social History   Tobacco Use  . Smoking status: Former Smoker    Types: Cigarettes    Quit date: 11/12/1981    Years since quitting: 38.6  . Smokeless tobacco: Never Used  Vaping Use  . Vaping Use: Never used  Substance Use Topics  . Alcohol use: Yes  . Drug use: No    Home Medications Prior to Admission medications   Medication Sig Start Date End Date Taking? Authorizing Provider  acetaminophen (TYLENOL) 500 MG tablet Take 1,000 mg by mouth every 6 (six) hours as needed for moderate pain.    [provider]  apixaban (ELIQUIS) 5 MG TABS tablet Take 1 tablet (5 mg total) by mouth 2 (two) times daily. 05/28/20   Benay Pike, MD  axitinib (INLYTA) 5 MG tablet Take 1 tablet (5 mg total) by mouth 2 (two) times daily. 05/28/20   Benay Pike, MD  bisacodyl (DULCOLAX) 5 MG EC tablet Take 1 tablet (5 mg total) by mouth daily as needed for moderate constipation. 06/12/20   Little Ishikawa, MD  fenofibrate (TRICOR) 145 MG tablet Take 145 mg by  mouth daily. 10/19/13   [provider]  HYDROcodone-acetaminophen (NORCO/VICODIN) 5-325 MG tablet Take 1-2 tablets by mouth every 6 (six) hours as needed for up to 7 days for moderate pain or severe pain. 06/12/20 06/19/20  Little Ishikawa, MD  lidocaine-prilocaine (EMLA) cream Apply to affected area once Patient not taking: No sig reported 05/28/20   Benay Pike, MD  linezolid (ZYVOX) 600 MG tablet Take 1 tablet (600 mg total) by mouth 2 (two) times daily for 7 days. 06/12/20 06/19/20  Little Ishikawa, MD  LORazepam (ATIVAN) 0.5 MG tablet Take 1 tablet (0.5 mg total) by mouth 2 (two) times daily as needed for anxiety. 06/08/20   Benay Pike, MD  losartan-hydrochlorothiazide (HYZAAR) 100-25 MG tablet Take 1 tablet by mouth daily.    [provider]  mirtazapine  (REMERON) 15 MG tablet Take 1 tablet (15 mg total) by mouth at bedtime. 05/28/20   Benay Pike, MD  Multiple Vitamin (ONE DAILY) tablet Take 1 tablet by mouth daily.    [provider]  ondansetron (ZOFRAN) 8 MG tablet Take 1 tablet (8 mg total) by mouth 2 (two) times daily as needed (Nausea or vomiting). Patient taking differently: Take 8 mg by mouth 2 (two) times daily as needed for nausea or vomiting. 05/28/20   Benay Pike, MD  prochlorperazine (COMPAZINE) 10 MG tablet Take 1 tablet (10 mg total) by mouth every 6 (six) hours as needed (Nausea or vomiting). Patient taking differently: Take 10 mg by mouth every 6 (six) hours as needed for nausea or vomiting. 05/28/20   Benay Pike, MD  traMADol (ULTRAM) 50 MG tablet Take 1 tablet (50 mg total) by mouth 3 (three) times daily as needed. Patient taking differently: Take 50 mg by mouth 3 (three) times daily as needed for moderate pain. 06/08/20   Benay Pike, MD    Allergies    Amlodipine and Crestor [rosuvastatin]  Review of Systems   Review of Systems  Neurological: Positive for dizziness.  All other systems reviewed and are negative.   Physical Exam Updated Vital Signs BP 133/73   Pulse (!) 102   Temp 98 F (36.7 C) (Oral)   Resp (!) 31   SpO2 95%   Physical Exam Vitals and nursing note reviewed.  HENT:     Head: Normocephalic.     Nose: Nose normal.     Mouth/Throat:     Mouth: Mucous membranes are dry.  Eyes:     Extraocular Movements: Extraocular movements intact.     Pupils: Pupils are equal, round, and reactive to light.  Cardiovascular:     Comments: Irregular and tachycardic Pulmonary:     Comments: Crackles bilateral bases Abdominal:     General: Abdomen is flat.     Palpations: Abdomen is soft.  Musculoskeletal:        General: Normal range of motion.     Cervical back: Normal range of motion and neck supple.     Comments: 1+ edema bilateral leg   Skin:    General: Skin is warm.      Capillary Refill: Capillary refill takes less than 2 seconds.  Neurological:     General: No focal deficit present.     Mental Status: She is alert and oriented to person, place, and time.  Psychiatric:        Mood and Affect: Mood normal.        Behavior: Behavior normal.     ED Results / Procedures / Treatments  Labs (all labs ordered are listed, but only abnormal results are displayed) Labs Reviewed  CBC WITH DIFFERENTIAL/PLATELET - Abnormal; Notable for the following components:      Result Value   WBC 15.3 (*)    Hemoglobin 9.7 (*)    HCT 32.9 (*)    MCH 24.0 (*)    MCHC 29.5 (*)    Platelets 452 (*)    Neutro Abs 12.4 (*)    Monocytes Absolute 1.4 (*)    Basophils Absolute 0.2 (*)    Abs Immature Granulocytes 0.29 (*)    All other components within normal limits  BRAIN NATRIURETIC PEPTIDE - Abnormal; Notable for the following components:   B Natriuretic Peptide 1,726.1 (*)    All other components within normal limits  I-STAT CHEM 8, ED - Abnormal; Notable for the following components:   Sodium 131 (*)    Chloride 97 (*)    Glucose, Bld 214 (*)    Calcium, Ion 1.05 (*)    Hemoglobin 10.5 (*)    HCT 31.0 (*)    All other components within normal limits  CULTURE, BLOOD (ROUTINE X 2)  CULTURE, BLOOD (ROUTINE X 2)  RESP PANEL BY RT-PCR (FLU A&B, COVID) ARPGX2  URINE CULTURE  COMPREHENSIVE METABOLIC PANEL  MAGNESIUM  PHOSPHORUS  LACTIC ACID, PLASMA  LACTIC ACID, PLASMA  URINALYSIS, ROUTINE W REFLEX MICROSCOPIC  TROPONIN I (HIGH SENSITIVITY)  TROPONIN I (HIGH SENSITIVITY)    EKG None  Radiology DG Chest Port 1 View  Result Date: 06/12/2020 CLINICAL DATA:  Ventricular tachycardia EXAM: PORTABLE CHEST 1 VIEW COMPARISON:  06/10/2020, CT 05/04/2020 FINDINGS: Slightly limited by pacing devices. Bilateral lung masses corresponding to history of known metastatic disease. Low lung volumes with vascular crowding. Hazy atelectasis at both lung bases. Stable  cardiomediastinal silhouette. No pneumothorax IMPRESSION: 1. Low lung volumes with vascular crowding and hazy basilar atelectasis 2. Bilateral pulmonary metastatic disease Electronically Signed   By: Donavan Foil M.D.   On: 06/14/2020 19:52    Procedures Procedures  CRITICAL CARE Performed by: Wandra Arthurs   Total critical care time: 45 minutes  Critical care time was exclusive of separately billable procedures and treating other patients.  Critical care was necessary to treat or prevent imminent or life-threatening deterioration.  Critical care was time spent personally by me on the following activities: development of treatment plan with patient and/or surrogate as well as nursing, discussions with consultants, evaluation of patient's response to treatment, examination of patient, obtaining history from patient or surrogate, ordering and performing treatments and interventions, ordering and review of laboratory studies, ordering and review of radiographic studies, pulse oximetry and re-evaluation of patient's condition.    Medications Ordered in ED Medications  amiodarone (NEXTERONE PREMIX) 360-4.14 MG/200ML-% (1.8 mg/mL) IV infusion (60 mg/hr Intravenous New Bag/Given 06/28/2020 1830)  amiodarone (NEXTERONE PREMIX) 360-4.14 MG/200ML-% (1.8 mg/mL) IV infusion (has no administration in time range)  lidocaine (XYLOCAINE) 4 mg/mL bolus via infusion 100 mg (100 mg Intravenous Bolus from Bag 06/07/2020 1857)    And  lidocaine (cardiac) 2000 mg in dextrose 5% 500 mL (4mg /mL) IV infusion (2 mg/min Intravenous New Bag/Given 06/05/2020 1857)  magnesium sulfate IVPB 2 g 50 mL (2 g Intravenous New Bag/Given 06/03/2020 1952)  amiodarone (NEXTERONE) IV bolus only 150 mg/100 mL (0 mg Intravenous Stopped 06/28/2020 1835)    ED Course  I have reviewed the triage vital signs and the nursing notes.  Pertinent labs & imaging results that were available during my care  of the patient were reviewed by me and  considered in my medical decision making (see chart for details).    MDM Rules/Calculators/A&P                         [name] is a 60 y.o. female who presented with V. tach.  Patient was shocked twice by EMS.  Patient was recently admitted for staph bacteremia and is on linezolid.  Patient is afebrile in the ED.  Patient appears fluid overloaded .I ordered amiodarone bolus and drip.  Will consult cardiology as well  7 pm Patient's electrolytes showed potassium of 4.5.  Patient's creatinine is normal. Patient went back into V. tach.  She was given lidocaine and started on lidocaine drip.  I consulted cardiology to see patient   8:39 PM Cardiology saw patient.  They ordered magnesium.  Patient is also in fluid overload I ordered Lasix as well.  Patient is a DNR.  Hospitalist will admit.  Patient is currently on amiodarone drip and lidocaine drip   Final Clinical Impression(s) / ED Diagnoses Final diagnoses:  None    Rx / DC Orders ED Discharge Orders    None       Drenda Freeze, MD 06/21/2020 2040

## 2020-06-15 NOTE — Consult Note (Signed)
Cardiology Consultation:   Patient ID: [name]; [id number]; 06-May-1960   Admit date: 06/07/2020 Date of Consult: 06/06/2020  Primary Care Provider: Josetta Huddle, MD Primary Cardiologist: New to [facility] Primary Electrophysiologist: None   Patient Profile:   [name] is a 61 y.o. female with a history of metastatic renal cell carcinoma, pulmonary embolus diagnosed in April and on Eliquis, previous stroke, hypertension, hyperlipidemia, and migraines who is being seen today for the evaluation of ventricular tachycardia at the request of Dr. Darl Householder.  History of Present Illness:   [name] was recently discharged from the hospital on May 10 following presentation with fevers in the setting of initial immunotherapy infusion for treatment of metastatic renal cell carcinoma.  Blood cultures ultimately grew MRSA and she was started on vancomycin with plan to continue linezolid per recommendation of ID.  She was to follow-up with palliative care and hospice in the outpatient setting and indicated that she did not want to be readmitted to the hospital if at all possible.  Today she returned to the [facility] ER via EMS today, initially complaining of significant fatigue and lightheadedness.  She was felt to be in VT by EMS, bolused with amiodarone and started on an infusion.  In the ER patient noted to develop torsades and was cardioverted twice by ER staff with resulting sinus rhythm, rebolused with amiodarone and started on lidocaine as well.  Cardiology was consulted to assist with rhythm management as she continued to have recurrent torsades.  With rebolus of amiodarone she returned to sinus rhythm and is in the process of receiving IV magnesium as well.  She has otherwise been hemodynamically stable in terms of blood pressure.  ECG while in sinus rhythm shows deep T wave inversions anteroseptal and high lateral leads, also prolonged QT interval with QTc 507 ms.  Unfortunately, there is no  old tracing for review.  Echocardiogram done recently on May 7 reported LVEF 60 to 65% without regional wall motion abnormalities, mild LVH, normal RV contraction.  Also mention of mobile nonobstructing mass in the proximal inferior vena cava suggesting either thrombus or extension of renal cell carcinoma.  Past Medical History:  Diagnosis Date  . Essential hypertension   . Metastatic renal cell carcinoma ([place])   . Migraine   . MRSA bacteremia   . Pulmonary embolus ([place])   . Stroke ([place])   . Vaginal prolapse     Past Surgical History:  Procedure Laterality Date  . ABDOMINAL HYSTERECTOMY    . CYSTOCELE REPAIR    . RECTOCELE REPAIR    . TUBAL LIGATION       Inpatient Medications: Scheduled Meds:  Continuous Infusions: . amiodarone    . [START ON Jul 09, 2020] amiodarone    . amiodarone    . lidocaine    . magnesium sulfate bolus IVPB 2 g (06/26/2020 1952)    Outpatient Medications: No current facility-administered medications on file prior to encounter.   Current Outpatient Medications on File Prior to Encounter  Medication Sig Dispense Refill  . acetaminophen (TYLENOL) 500 MG tablet Take 1,000 mg by mouth every 6 (six) hours as needed for moderate pain.    Marland Kitchen apixaban (ELIQUIS) 5 MG TABS tablet Take 1 tablet (5 mg total) by mouth 2 (two) times daily. 60 tablet 3  . axitinib (INLYTA) 5 MG tablet Take 1 tablet (5 mg total) by mouth 2 (two) times daily. 60 tablet 11  . bisacodyl (DULCOLAX) 5 MG EC tablet Take 1 tablet (  5 mg total) by mouth daily as needed for moderate constipation. 30 tablet 0  . fenofibrate (TRICOR) 145 MG tablet Take 145 mg by mouth daily.    Marland Kitchen HYDROcodone-acetaminophen (NORCO/VICODIN) 5-325 MG tablet Take 1-2 tablets by mouth every 6 (six) hours as needed for up to 7 days for moderate pain or severe pain. 40 tablet 0  . lidocaine-prilocaine (EMLA) cream Apply to affected area once (Patient not taking: No sig reported) 30 g 3  . linezolid (ZYVOX) 600 MG tablet  Take 1 tablet (600 mg total) by mouth 2 (two) times daily for 7 days. 14 tablet 0  . LORazepam (ATIVAN) 0.5 MG tablet Take 1 tablet (0.5 mg total) by mouth 2 (two) times daily as needed for anxiety. 30 tablet 0  . losartan-hydrochlorothiazide (HYZAAR) 100-25 MG tablet Take 1 tablet by mouth daily.    . mirtazapine (REMERON) 15 MG tablet Take 1 tablet (15 mg total) by mouth at bedtime. 30 tablet 0  . Multiple Vitamin (ONE DAILY) tablet Take 1 tablet by mouth daily.    . ondansetron (ZOFRAN) 8 MG tablet Take 1 tablet (8 mg total) by mouth 2 (two) times daily as needed (Nausea or vomiting). (Patient taking differently: Take 8 mg by mouth 2 (two) times daily as needed for nausea or vomiting.) 30 tablet 1  . prochlorperazine (COMPAZINE) 10 MG tablet Take 1 tablet (10 mg total) by mouth every 6 (six) hours as needed (Nausea or vomiting). (Patient taking differently: Take 10 mg by mouth every 6 (six) hours as needed for nausea or vomiting.) 30 tablet 1  . traMADol (ULTRAM) 50 MG tablet Take 1 tablet (50 mg total) by mouth 3 (three) times daily as needed. (Patient taking differently: Take 50 mg by mouth 3 (three) times daily as needed for moderate pain.) 45 tablet 0    Allergies:    Allergies  Allergen Reactions  . Amlodipine     Other reaction(s): dizziness  . Crestor [Rosuvastatin] Other (See Comments)    Muscle cramps    Social History:   Social History   Tobacco Use  . Smoking status: Former Smoker    Types: Cigarettes    Quit date: 11/12/1981    Years since quitting: 38.6  . Smokeless tobacco: Never Used  Substance Use Topics  . Alcohol use: Yes    Family History:   The patient's family history includes COPD in her mother; Colon cancer in her paternal grandfather; Hypertension in her mother; Leukemia in her father.  ROS:  Please see the history of present illness.  All other ROS reviewed and negative.     Physical Exam/Data:   Vitals:   06/21/2020 1830 06/21/2020 1845 06/28/2020 1900  06/08/2020 1904  BP: (!) 143/75 (!) 145/74 (!) 146/76   Pulse: (!) 114 (!) 112 (!) 111 (!) 110  Resp: (!) 29 (!) 29 (!) 33 (!) 34  Temp:      TempSrc:      SpO2: 94% 96% 96% 95%   No intake or output data in the 24 hours ending 06/14/2020 1959 There were no vitals filed for this visit. There is no height or weight on file to calculate BMI.   Gen: Ill-appearing woman, no distress. HEENT: Conjunctiva and lids normal, oropharynx clear. Neck: Supple, no elevated JVP or carotid bruits, no thyromegaly. Lungs: Clear to auscultation, nonlabored breathing at rest. Cardiac: Regular rate and rhythm, no S3, 1/6 systolic murmur, no pericardial rub. Abdomen: Soft, nontender, bowel sounds present. Extremities: No pitting edema,  distal pulses 2+. Skin: Warm and dry. Musculoskeletal: No kyphosis. Neuropsychiatric: Alert and oriented x3, affect grossly appropriate.  Telemetry:  I personally reviewed telemetry which shows sinus rhythm.  Relevant CV Studies:  Echocardiogram 06/09/2020: 1. Left ventricular ejection fraction, by estimation, is 60 to 65%. Left  ventricular ejection fraction by 3D volume is 59 %. The left ventricle has  normal function. The left ventricle has no regional wall motion  abnormalities. There is mild concentric  left ventricular hypertrophy. Indeterminate diastolic filling due to E-A  fusion.  2. Right ventricular systolic function is normal. The right ventricular  size is normal. There is mildly elevated pulmonary artery systolic  pressure.  3. Left atrial size was mildly dilated.  4. The mitral valve is normal in structure. Trivial mitral valve  regurgitation. No evidence of mitral stenosis.  5. The aortic valve is normal in structure. Aortic valve regurgitation is  not visualized. No aortic stenosis is present.  6. There is a mobile, nonobstructive mass in the proximal inferior vena  cava, measuring at least 3 cm in length, roughly 1 cm in diameter, may  represent  a thrombembolus in transit or extension of renal cell carcinoma.  The inferior vena cava is dilated  in size with <50% respiratory variability, suggesting right atrial  pressure of 15 mmHg.   Laboratory Data:  Chemistry Recent Labs  Lab 06/10/20 0334 06/11/20 0354 06/12/20 0419 06/09/2020 1847  NA 134* 132* 132* 131*  K 3.9 4.2 4.2 4.5  CL 102 101 100 97*  CO2 23 24 25   --   GLUCOSE 106* 103* 156* 214*  BUN 13 11 12 12   CREATININE 0.77 0.67 0.71 0.90  CALCIUM 8.4* 8.4* 8.8*  --   GFRNONAA >60 >60 >60  --   ANIONGAP 9 7 7   --     Recent Labs  Lab 06/09/20 0341  PROT 5.9*  ALBUMIN 2.4*  AST 72*  ALT 25  ALKPHOS 79  BILITOT 0.8   Hematology Recent Labs  Lab 06/11/20 0354 06/12/20 0419 06/22/2020 1831 06/07/2020 1847  WBC 10.2 10.6* 15.3*  --   RBC 3.14* 3.16* 4.05  --   HGB 7.6* 7.6* 9.7* 10.5*  HCT 25.6* 25.3* 32.9* 31.0*  MCV 81.5 80.1 81.2  --   MCH 24.2* 24.1* 24.0*  --   MCHC 29.7* 30.0 29.5*  --   RDW 15.4 15.3 15.5  --   PLT 298 297 452*  --    Cardiac EnzymesNo results for input(s): TROPONINIHS in the last 720 hours. BNP Recent Labs  Lab 06/10/20 0334  BNP 183.0*    DDimerNo results for input(s): DDIMER in the last 168 hours.  Radiology/Studies:  DG Chest Port 1 View  Result Date: 06/08/2020 CLINICAL DATA:  Ventricular tachycardia EXAM: PORTABLE CHEST 1 VIEW COMPARISON:  06/10/2020, CT 05/04/2020 FINDINGS: Slightly limited by pacing devices. Bilateral lung masses corresponding to history of known metastatic disease. Low lung volumes with vascular crowding. Hazy atelectasis at both lung bases. Stable cardiomediastinal silhouette. No pneumothorax IMPRESSION: 1. Low lung volumes with vascular crowding and hazy basilar atelectasis 2. Bilateral pulmonary metastatic disease Electronically Signed   By: Donavan Foil M.D.   On: 06/28/2020 19:52    Assessment and Plan:   1.  Patient presents with torsades des pointes in the setting of prolonged QT interval  by ECG in sinus rhythm, QTc 507 ms.  Magnesium level was 1.6 on May 8.  She is also on a number of medications which could  be contributing to QT prolongation including Remeron, Zofran, Compazine, and Ultram.  Hydrocodone at the current administered doses should not be causing QT prolongation.  Unfortunately, no old tracing available to see if she has any baseline QT prolongation prior to the recent health changes and medications.  She is maintaining sinus rhythm now, on both IV amiodarone and IV lidocaine, is receiving IV magnesium as well.  Recent echocardiogram shows LVEF 60 to 65% without wall motion abnormalities, normal RV function.  2.  Metastatic renal cell carcinoma, following with oncology and recently started on immunotherapy.  3.  Recently diagnosed pulmonary embolus by CT imaging, on Eliquis since April.  4.  Recent documented MRSA bacteremia, initially treated with IV vancomycin and transitioned to linezolid.  5.  Goals of care discussion pending, she was to be evaluated by palliative care and potentially hospice in the outpatient setting after recent hospital discharge.  Discussed with Dr. Darl Householder, he plans for patient to be admitted to the internal medicine service, we will follow in consultation. Would continue both IV amiodarone and IV lidocaine in the short-term, complete current IV magnesium and consider repeating another dose given her recent magnesium level of 1.6.  Would also discontinue use of QT prolonging medications as discussed above.  We will have EP round on the patient over the weekend, she can likely be weaned off of antiarrhythmics particularly if QT interval improves.  Would consider having goals of care discussion while she is hospitalized  Signed, Rozann Lesches, MD  06/20/2020 7:59 PM

## 2020-06-15 NOTE — ED Triage Notes (Signed)
Pt was brought in by EMS. Pt went into v tach with pulses upon arrival to ER. EMS gave 150 amiodarone prior to coming into ER. Pt then went into V tach in the ED room. Pt was synch cardioverted 2 times, and went into Sinus Tach. Pt had a bolus of amiodarone 150mg  ivp, and also a bolus of Lidocaine. Both were followed with  drips

## 2020-06-15 NOTE — Progress Notes (Signed)
Pharmacy Antibiotic Note  [name] is a 60 y.o. female admitted on 06/07/2020 with MRSE bacteremia. She was recently discharged on Zyvox and has been on abx therapy since 5/5. Pharmacy has been consulted for vancomycin dosing due to concern for QTc prolongation in the setting of Zyvox. Of note, patient is compliant with Zyvox per husband.  WBC 15.3, LA 5.9, SCr wnl, AF   Plan: -Vancomycin 1000 mg IV q24 hr (est AUC 485 based on SCr 0.9; Vd 0.72) -Measure vancomycin AUC at steady state as indicated -F/u repeat blood cultures and clinical progress       Temp (24hrs), Avg:98 F (36.7 C), Min:98 F (36.7 C), Max:98 F (36.7 C)  Recent Labs  Lab 06/09/20 0341 06/10/20 0334 06/11/20 0354 06/12/20 0419 06/21/2020 1831 06/13/2020 1832 06/26/2020 1847  WBC 11.2* 10.9* 10.2 10.6* 15.3*  --   --   CREATININE 0.97 0.77 0.67 0.71 1.16*  --  0.90  LATICACIDVEN  --   --   --   --   --  5.9*  --     Estimated Creatinine Clearance: 56 mL/min (by C-G formula based on SCr of 0.9 mg/dL).    Allergies  Allergen Reactions  . Amlodipine     Other reaction(s): dizziness  . Crestor [Rosuvastatin] Other (See Comments)    Muscle cramps    Antimicrobials this admission: Vancomycin 5/5 >> 5/10; 5/13>> Linezolid 5/11 >> 5/13  Dose adjustments this admission:  Microbiology results: 5/13 BCx:    Thank you for allowing pharmacy to be a part of this patient's care.  Albertina Parr, PharmD., BCPS, BCCCP Clinical Pharmacist Please refer to [facility] for unit-specific pharmacist

## 2020-06-15 NOTE — ED Notes (Signed)
Transport nurse took pt to the floor.

## 2020-06-15 NOTE — H&P (Addendum)
History and Physical    [name]:[id number] DOB: 1960-05-20 DOA: 06/24/2020  PCP: Josetta Huddle, MD  Patient coming from: Home  I have personally briefly reviewed patient's old medical records in [place]  Chief Complaint: Dizziness  HPI: [name] is a 60 y.o. female with medical history significant of metastatic renal cell carcinoma, recent MRSA bacteremia, still on linezolid presents with lightheadedness and dizziness that started today.  Patient had syncopal episode at home and EMS was called and patient was noted to be in V. tach that spontaneously resolved.  Patient states at home prior to the event, she started feeling lightheaded dizzy and short of breath and then syncopized.  Patient then had recurrent episode of V. tach and was shocked twice by EMS.  Patient was given 150 mg of amnio by EMS.  Patient discussed with Dr. Darl Householder who had cardiology evaluate the patient.  Patient was started on amiodarone drip here as well as lidocaine drip.  Patient also received magnesium.  Patient is a DNR.  Patient was seen by Dr. Domenic Polite in the ED.  EKG showing sinus rhythm with T wave versions in lateral and anteroseptal leads, prolonged QT.  May show an EF of 60 to 65%.  Patient seen by cardiology, patient was in torsades and felt prolonged QT can be exacerbated by Zofran, Compazine, Ultram and Remeron.  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  129 likely due to V. tach.  Patient husband at bedside and provide history.  Patient states patient was sitting there at home and had a seizure, tonic-clonic in nature dropped her cup last for couple seconds.  Patient does not recall the event, denies any loss of bowel bladder.  Of note patient has a history of seizure 1 time before after COVID-vaccine couple years ago but is not on any medications for it.  At this juncture patient and husband deferred any neurological work-up.  Patient husband helping provide history as well. ED  Course: As outlined above  Review of Systems: As per HPI otherwise all other systems reviewed and are negative.   Past Medical History:  Diagnosis Date  . Essential hypertension   . Metastatic renal cell carcinoma ([place])   . Migraine   . MRSA bacteremia   . Pulmonary embolus ([place])   . Stroke ([place])   . Vaginal prolapse     Past Surgical History:  Procedure Laterality Date  . ABDOMINAL HYSTERECTOMY    . CYSTOCELE REPAIR    . RECTOCELE REPAIR    . TUBAL LIGATION      Social History  reports that she quit smoking about 38 years ago. Her smoking use included cigarettes. She has never used smokeless tobacco. She reports current alcohol use. She reports that she does not use drugs.  Allergies  Allergen Reactions  . Amlodipine     Other reaction(s): dizziness  . Crestor [Rosuvastatin] Other (See Comments)    Muscle cramps    Family History  Problem Relation Age of Onset  . COPD Mother   . Hypertension Mother   . Leukemia Father        Chronic mylemonocytic leukemia  . Colon cancer Paternal Grandfather     Prior to Admission medications   Medication Sig Start Date End Date Taking? Authorizing Provider  acetaminophen (TYLENOL) 500 MG tablet Take 1,000 mg by mouth every 6 (six) hours as needed for moderate pain.    [provider]  apixaban (ELIQUIS) 5 MG  TABS tablet Take 1 tablet (5 mg total) by mouth 2 (two) times daily. 05/28/20   Benay Pike, MD  axitinib (INLYTA) 5 MG tablet Take 1 tablet (5 mg total) by mouth 2 (two) times daily. 05/28/20   Benay Pike, MD  bisacodyl (DULCOLAX) 5 MG EC tablet Take 1 tablet (5 mg total) by mouth daily as needed for moderate constipation. 06/12/20   Little Ishikawa, MD  fenofibrate (TRICOR) 145 MG tablet Take 145 mg by mouth daily. 10/19/13   [provider]  HYDROcodone-acetaminophen (NORCO/VICODIN) 5-325 MG tablet Take 1-2 tablets by mouth every 6 (six) hours as needed for up to 7 days for moderate pain or  severe pain. 06/12/20 06/19/20  Little Ishikawa, MD  lidocaine-prilocaine (EMLA) cream Apply to affected area once Patient not taking: No sig reported 05/28/20   Benay Pike, MD  linezolid (ZYVOX) 600 MG tablet Take 1 tablet (600 mg total) by mouth 2 (two) times daily for 7 days. 06/12/20 06/19/20  Little Ishikawa, MD  LORazepam (ATIVAN) 0.5 MG tablet Take 1 tablet (0.5 mg total) by mouth 2 (two) times daily as needed for anxiety. 06/08/20   Benay Pike, MD  losartan-hydrochlorothiazide (HYZAAR) 100-25 MG tablet Take 1 tablet by mouth daily.    [provider]  mirtazapine (REMERON) 15 MG tablet Take 1 tablet (15 mg total) by mouth at bedtime. 05/28/20   Benay Pike, MD  Multiple Vitamin (ONE DAILY) tablet Take 1 tablet by mouth daily.    [provider]  ondansetron (ZOFRAN) 8 MG tablet Take 1 tablet (8 mg total) by mouth 2 (two) times daily as needed (Nausea or vomiting). Patient taking differently: Take 8 mg by mouth 2 (two) times daily as needed for nausea or vomiting. 05/28/20   Benay Pike, MD  prochlorperazine (COMPAZINE) 10 MG tablet Take 1 tablet (10 mg total) by mouth every 6 (six) hours as needed (Nausea or vomiting). Patient taking differently: Take 10 mg by mouth every 6 (six) hours as needed for nausea or vomiting. 05/28/20   Benay Pike, MD  traMADol (ULTRAM) 50 MG tablet Take 1 tablet (50 mg total) by mouth 3 (three) times daily as needed. Patient taking differently: Take 50 mg by mouth 3 (three) times daily as needed for moderate pain. 06/08/20   Benay Pike, MD    Physical Exam: Vitals:   06/19/2020 2000 06/27/2020 2030 06/27/2020 2100 06/26/2020 2130  BP: 133/73 127/73 131/73 132/71  Pulse: (!) 102 96 93 92  Resp: (!) 31 (!) 31 (!) 28 (!) 29  Temp:      TempSrc:      SpO2: 95% 94% 95% 93%    Constitutional: NAD, calm, comfortable Vitals:   06/13/2020 2000 06/27/2020 2030 06/04/2020 2100 06/08/2020 2130  BP: 133/73 127/73 131/73 132/71  Pulse:  (!) 102 96 93 92  Resp: (!) 31 (!) 31 (!) 28 (!) 29  Temp:      TempSrc:      SpO2: 95% 94% 95% 93%   Eyes: PERRL, lids and conjunctivae normal ENMT: Mucous membranes are dry. Posterior pharynx clear of any exudate or lesions.Normal dentition.  Neck: normal, supple, no masses, no thyromegaly Respiratory: clear to auscultation bilaterally, no wheezing, no crackles. Normal respiratory effort. No accessory muscle use.  Cardiovascular: Regular rate and rhythm, systolic murmur, rubs / gallops.  1+ bilateral lower extremity edema. 2+ pedal pulses. No carotid bruits.  Abdomen: no tenderness, no masses palpated. No hepatosplenomegaly. Bowel sounds positive.  Musculoskeletal: no clubbing /  cyanosis. No joint deformity upper and lower extremities. Good ROM, no contractures. Normal muscle tone.  Skin: no rashes, lesions, ulcers. No induration Neurologic: CN 2-12 grossly intact. Sensation intact, DTR normal. Strength 5/5 in all 4.  Psychiatric: Normal judgment and insight. Alert and oriented x 3. Normal mood.   Labs on Admission: I have personally reviewed following labs and imaging studies  CBC: Recent Labs  Lab 06/09/20 0341 06/10/20 0334 06/11/20 0354 06/12/20 0419 06/17/2020 1831 06/11/2020 1847  WBC 11.2* 10.9* 10.2 10.6* 15.3*  --   NEUTROABS  --   --   --   --  12.4*  --   HGB 7.7* 8.7* 7.6* 7.6* 9.7* 10.5*  HCT 25.7* 29.8* 25.6* 25.3* 32.9* 31.0*  MCV 80.1 82.3 81.5 80.1 81.2  --   PLT 260 296 298 297 452*  --     Basic Metabolic Panel: Recent Labs  Lab 06/09/20 0341 06/10/20 0334 06/10/20 0401 06/11/20 0354 06/12/20 0419 07/01/2020 1831 06/26/2020 1847  NA 134* 134*  --  132* 132* 133* 131*  K 3.8 3.9  --  4.2 4.2 4.4 4.5  CL 103 102  --  101 100 96* 97*  CO2 22 23  --  24 25 21*  --   GLUCOSE 87 106*  --  103* 156* 216* 214*  BUN 14 13  --  11 12 9 12   CREATININE 0.97 0.77  --  0.67 0.71 1.16* 0.90  CALCIUM 8.5* 8.4*  --  8.4* 8.8* 8.8*  --   MG  --   --  1.6*  --   --   1.8  --   PHOS  --   --   --   --   --  4.5  --     GFR: Estimated Creatinine Clearance: 56 mL/min (by C-G formula based on SCr of 0.9 mg/dL).  Liver Function Tests: Recent Labs  Lab 06/09/20 0341 06/23/2020 1831  AST 72* 63*  ALT 25 32  ALKPHOS 79 133*  BILITOT 0.8 1.0  PROT 5.9* 6.3*  ALBUMIN 2.4* 2.3*    Urine analysis:    Component Value Date/Time   COLORURINE YELLOW 06/08/2020 2005   APPEARANCEUR CLEAR 06/08/2020 2005   LABSPEC 1.015 06/08/2020 2005   PHURINE 5.0 06/08/2020 2005   GLUCOSEU NEGATIVE 06/08/2020 2005   HGBUR MODERATE (A) 06/08/2020 2005   BILIRUBINUR NEGATIVE 06/08/2020 2005   KETONESUR NEGATIVE 06/08/2020 2005   PROTEINUR NEGATIVE 06/08/2020 2005   NITRITE NEGATIVE 06/08/2020 2005   LEUKOCYTESUR TRACE (A) 06/08/2020 2005    Radiological Exams on Admission: DG Chest Port 1 View  Result Date: 06/21/2020 CLINICAL DATA:  Ventricular tachycardia EXAM: PORTABLE CHEST 1 VIEW COMPARISON:  06/10/2020, CT 05/04/2020 FINDINGS: Slightly limited by pacing devices. Bilateral lung masses corresponding to history of known metastatic disease. Low lung volumes with vascular crowding. Hazy atelectasis at both lung bases. Stable cardiomediastinal silhouette. No pneumothorax IMPRESSION: 1. Low lung volumes with vascular crowding and hazy basilar atelectasis 2. Bilateral pulmonary metastatic disease Electronically Signed   By: Donavan Foil M.D.   On: 06/24/2020 19:52    EKG: Independently reviewed.   Assessment/Plan Principal Problem:   Torsades de pointes (HCC) Active Problems:   Normocytic normochromic anemia   Metastatic renal cell carcinoma (HCC)   Pulmonary embolism (HCC)   Bacteremia   Ventricular tachyarrhythmia ([place])   Seizure ([place]) 60 year old female admitted with dizziness secondary to V. Tach/torsades. V. Tach/torsades-continue amiodarone and lidocaine drip Admit to stepdown Appreciate cardiology consult  and input Patient receiving magnesium in the  ED Continue telemetry Avoid QT prolonging agents Zofran Compazine Ultram and Remeron Continue as needed hydrocodone for pain as needed per cardiology's recommendations should not cause prolonged QT in current dosage. Follow-up morning labs  Metastatic renal cell carcinoma- follow-up with oncology as outpatient, continue hydrocodone  History of PE- diagnosed in April, continue Eliquis  MRSA bacteremia-consulting pharmacy abx recs, concerning of potential prolonging qt w/ zyvox Pt discussed w/ pharmacy-give vanc  Normocytic-normochromic anemia- slightly better than baseline at 78, continue to monitor  Seizures-seizure precautions, patient deferred neurology consult, likely precipitated by V. tach, continue to monitor  Disposition-admit to stepdown, patient is a DNR, patient discussed with husband Mr. Ronalee Belts and patient at bedside, patient desires DNR    DVT prophylaxis: Eliquis Code Status:   DNR  family Communication:  Discussed with patient Disposition Plan:   Patient is from:  home  Anticipated DC to:  home  Anticipated DC date:  2-3days  Anticipated DC barriers: hr  Consults called:  Cards-Dr. Domenic Polite Admission status:  Stepdown  Severity of Illness: The appropriate patient status for this patient is INPATIENT. Inpatient status is judged to be reasonable and necessary in order to provide the required intensity of service to ensure the patient's safety. The patient's presenting symptoms, physical exam findings, and initial radiographic and laboratory data in the context of their chronic comorbidities is felt to place them at high risk for further clinical deterioration. Furthermore, it is not anticipated that the patient will be medically stable for discharge from the hospital within 2 midnights of admission. The following factors support the patient status of inpatient.  Concerns of recurrent V. tach, controlled heart rate, shortness of breath has improved.  Patient denies any chest  pain.   * I certify that at the point of admission it is my clinical judgment that the patient will require inpatient hospital care spanning beyond 2 midnights from the point of admission due to high intensity of service, high risk for further deterioration and high frequency of surveillance required.Jacqlyn Krauss MD [facility] [phone]  How to contact the [facility] Attending or Consulting provider [place] or covering provider during after hours [place], for this patient?   1. Check the care team in [facility] and look for a) attending/consulting [facility] provider listed and b) the [facility] team listed 2. Log into [email or web address] and use [place]'s universal password to access. If you do not have the password, please contact the hospital operator. 3. Locate the [facility] provider you are looking for under [facility] and page to a number that you can be directly reached. 4. If you still have difficulty reaching the provider, please page the [facility] (Director on Call) for the Hospitalists listed on amion for assistance.  06/30/2020, 9:55 PM

## 2020-06-15 NOTE — ED Notes (Signed)
Pt went into V Tach again, sych cardioverted @200J . Converted into sinus Tach.

## 2020-06-16 ENCOUNTER — Encounter (HOSPITAL_COMMUNITY): Payer: Self-pay | Admitting: Family Medicine

## 2020-06-16 DIAGNOSIS — Z66 Do not resuscitate: Secondary | ICD-10-CM

## 2020-06-16 DIAGNOSIS — Z7189 Other specified counseling: Secondary | ICD-10-CM

## 2020-06-16 DIAGNOSIS — Z515 Encounter for palliative care: Secondary | ICD-10-CM | POA: Diagnosis not present

## 2020-06-16 LAB — COMPREHENSIVE METABOLIC PANEL
ALT: 552 U/L — ABNORMAL HIGH (ref 0–44)
AST: 1290 U/L — ABNORMAL HIGH (ref 15–41)
Albumin: 2.2 g/dL — ABNORMAL LOW (ref 3.5–5.0)
Alkaline Phosphatase: 136 U/L — ABNORMAL HIGH (ref 38–126)
Anion gap: 16 — ABNORMAL HIGH (ref 5–15)
BUN: 14 mg/dL (ref 8–23)
CO2: 20 mmol/L — ABNORMAL LOW (ref 22–32)
Calcium: 8.7 mg/dL — ABNORMAL LOW (ref 8.9–10.3)
Chloride: 94 mmol/L — ABNORMAL LOW (ref 98–111)
Creatinine, Ser: 1.08 mg/dL — ABNORMAL HIGH (ref 0.44–1.00)
GFR, Estimated: 58 mL/min — ABNORMAL LOW (ref >60)
Glucose, Bld: 139 mg/dL — ABNORMAL HIGH (ref 70–99)
Potassium: 4.2 mmol/L (ref 3.5–5.1)
Sodium: 130 mmol/L — ABNORMAL LOW (ref 135–145)
Total Bilirubin: 1 mg/dL (ref 0.3–1.2)
Total Protein: 6 g/dL — ABNORMAL LOW (ref 6.5–8.1)

## 2020-06-16 LAB — HIV ANTIBODY (ROUTINE TESTING W REFLEX): HIV Screen 4th Generation wRfx: NONREACTIVE

## 2020-06-16 LAB — CBC WITH DIFFERENTIAL/PLATELET
Abs Immature Granulocytes: 0.16 10*3/uL — ABNORMAL HIGH (ref 0.00–0.07)
Basophils Absolute: 0.1 10*3/uL (ref 0.0–0.1)
Basophils Relative: 0 %
Eosinophils Absolute: 0 10*3/uL (ref 0.0–0.5)
Eosinophils Relative: 0 %
HCT: 33.7 % — ABNORMAL LOW (ref 36.0–46.0)
Hemoglobin: 10.1 g/dL — ABNORMAL LOW (ref 12.0–15.0)
Immature Granulocytes: 1 %
Lymphocytes Relative: 19 %
Lymphs Abs: 2.5 10*3/uL (ref 0.7–4.0)
MCH: 23.8 pg — ABNORMAL LOW (ref 26.0–34.0)
MCHC: 30 g/dL (ref 30.0–36.0)
MCV: 79.3 fL — ABNORMAL LOW (ref 80.0–100.0)
Monocytes Absolute: 1.3 10*3/uL — ABNORMAL HIGH (ref 0.1–1.0)
Monocytes Relative: 10 %
Neutro Abs: 9.2 10*3/uL — ABNORMAL HIGH (ref 1.7–7.7)
Neutrophils Relative %: 70 %
Platelets: 486 10*3/uL — ABNORMAL HIGH (ref 150–400)
RBC: 4.25 MIL/uL (ref 3.87–5.11)
RDW: 15.8 % — ABNORMAL HIGH (ref 11.5–15.5)
WBC: 13.2 10*3/uL — ABNORMAL HIGH (ref 4.0–10.5)
nRBC: 0.2 % (ref 0.0–0.2)

## 2020-06-16 LAB — TROPONIN I (HIGH SENSITIVITY): Troponin I (High Sensitivity): 173 ng/L (ref <18)

## 2020-06-16 LAB — LACTIC ACID, PLASMA: Lactic Acid, Venous: 3.2 mmol/L (ref 0.5–1.9)

## 2020-06-16 LAB — MAGNESIUM: Magnesium: 2.3 mg/dL (ref 1.7–2.4)

## 2020-06-16 LAB — TSH: TSH: 5.03 u[IU]/mL — ABNORMAL HIGH (ref 0.350–4.500)

## 2020-06-16 MED ORDER — AMIODARONE HCL IN DEXTROSE 360-4.14 MG/200ML-% IV SOLN
60.0000 mg/h | INTRAVENOUS | Status: DC
  Filled 2020-06-16: qty 200

## 2020-06-16 MED ORDER — ACETAMINOPHEN 325 MG PO TABS: 650.0000 mg | ORAL_TABLET | Freq: Four times a day (QID) | ORAL | Status: DC | PRN

## 2020-06-16 MED ORDER — LORAZEPAM 2 MG/ML IJ SOLN
0.5000 mg | INTRAMUSCULAR | Status: DC | PRN
  Administered 2020-06-16: 1 mg via INTRAVENOUS
  Filled 2020-06-16: qty 1

## 2020-06-16 MED ORDER — ENSURE ENLIVE PO LIQD: 237.0000 mL | Freq: Two times a day (BID) | ORAL | Status: DC

## 2020-06-16 MED ORDER — MAGNESIUM SULFATE 2 GM/50ML IV SOLN
2.0000 g | INTRAVENOUS | Status: AC
  Administered 2020-06-16: 2 g via INTRAVENOUS
  Filled 2020-06-16: qty 50

## 2020-06-16 MED ORDER — POLYVINYL ALCOHOL 1.4 % OP SOLN
1.0000 [drp] | Freq: Four times a day (QID) | OPHTHALMIC | Status: DC | PRN
  Filled 2020-06-16: qty 15

## 2020-06-16 MED ORDER — ACETAMINOPHEN 650 MG RE SUPP
650.0000 mg | Freq: Four times a day (QID) | RECTAL | Status: DC | PRN
Start: 2020-06-16 — End: 2020-06-16

## 2020-06-16 MED ORDER — MORPHINE SULFATE (PF) 2 MG/ML IV SOLN: 2.0000 mg | INTRAVENOUS | Status: DC | PRN

## 2020-06-16 MED ORDER — BIOTENE DRY MOUTH MT LIQD: 15.0000 mL | OROMUCOSAL | Status: DC | PRN

## 2020-06-18 ENCOUNTER — Inpatient Hospital Stay: Payer: BC Managed Care – PPO | Admitting: Hematology and Oncology

## 2020-06-20 LAB — CULTURE, BLOOD (ROUTINE X 2): Culture: NO GROWTH

## 2020-06-21 LAB — CULTURE, BLOOD (ROUTINE X 2)
Culture: NO GROWTH
Special Requests: ADEQUATE

## 2020-06-22 ENCOUNTER — Other Ambulatory Visit (HOSPITAL_COMMUNITY): Payer: Self-pay

## 2020-06-25 ENCOUNTER — Ambulatory Visit: Payer: BC Managed Care – PPO

## 2020-06-25 ENCOUNTER — Other Ambulatory Visit: Payer: BC Managed Care – PPO

## 2020-07-04 NOTE — Progress Notes (Signed)
[facility] updated. On pt VT. LOC, jaundice and pain .

## 2020-07-04 NOTE — Progress Notes (Signed)
Family at bedside. Pt has 4 siblings, spouse, son and several grandkids (older teens) . I let family visit with pt till 51 and then explained the visitation policy at this time and how that would change with pt goals . Family verbalized understanding. Son and spouse to remain at bedside if they desire.

## 2020-07-04 NOTE — Progress Notes (Signed)
Pt cont to go in and out of vt. md paged again. Mg infusing. Lido and amio gtt infusing.

## 2020-07-04 NOTE — Progress Notes (Signed)
   2020/06/22 1106  Clinical Encounter Type  Visited With Family  Visit Type Death  Referral From Physician  Consult/Referral To [place] responded to consult for end of life. Pt's family was bathing her when chaplain arrived and declined spiritual care. Chaplain provided them with an extra sheet. Family at bedside shared that Pt's husband, Ronalee Belts, was in the hallway. Chaplain engaged active listening and emotional support. Ronalee Belts shared he has a strong support system of family, friends, and church. Chaplain remains available.    This note was prepared by Chaplain Resident, Dante Gang, MDiv. Chaplain remains available as needed through the on-call pager: [phone].

## 2020-07-04 NOTE — Consult Note (Signed)
Palliative Medicine Inpatient Consult Note  Reason for consult:  Goals of Care "12F w/ metatastic renal cell carcinoma and recent MRSE bacteremia admit with v-tach/torsades. Please assist with MDM and goals of care."  HPI:  Per intake H&P --> [name] is a 60 y.o. female with medical history significant of metastatic renal cell carcinoma, recent MRSA bacteremia, still on linezolid presents with lightheadedness and dizziness that started today.  Patient had syncopal episode at home and EMS was called and patient was noted to be in V. tach that spontaneously resolved.  Patient states at home prior to the event, she started feeling lightheaded dizzy and short of breath and then syncopized.  Patient then had recurrent episode of V. tach and was shocked twice by EMS.  Patient was given 150 mg of amnio by EMS.  Patient discussed with Dr. Darl Householder who had cardiology evaluate the patient.  Patient was started on amiodarone drip here as well as lidocaine drip.  Patient also received magnesium.  Patient is a DNR.  Patient was seen by Dr. Domenic Polite in the ED.  EKG showing sinus rhythm with T wave versions in lateral and anteroseptal leads, prolonged QT.  May show an EF of 60 to 65%.  Patient seen by cardiology, patient was in torsades and felt prolonged QT can be exacerbated by Zofran, Compazine, Ultram and Remeron.  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  129 likely due to V. tach.  Patient husband at bedside and provide history.  Patient states patient was sitting there at home and had a seizure, tonic-clonic in nature dropped her cup last for couple seconds.  Patient does not recall the event, denies any loss of bowel bladder.  Of note patient has a history of seizure 1 time before after COVID-vaccine couple years ago but is not on any medications for it.  At this juncture patient and husband deferred any neurological work-up.  Patient husband helping provide history as well.  Palliative care was  asked to get involved in the setting of torsades de points overnight to further discuss goals of care in the setting of metastatic disease with no curative options.   Clinical Assessment/Goals of Care:  *Please note that this is a verbal dictation therefore any spelling or grammatical errors are due to the "[place]" system interpretation.  I have reviewed medical records including EPIC notes, labs and imaging, received report from bedside RN, assessed the patient who was lying in bed in no acute distress.    I met with [name] and her husband [name] to further discuss diagnosis prognosis, GOC, EOL wishes, disposition and options.   I introduced Palliative Medicine as specialized medical care for people living with serious illness. It focuses on providing relief from the symptoms and stress of a serious illness. The goal is to improve quality of life for both the patient and the family.  [name] shares with me that she lives in [place], [place].  [name] has lived in [place] throughout her life.  She has been married to her husband, [name] for the past 44 years.  She is a retired Oncologist.  She enjoys various types of cuisine and [place] music.  She is a woman of faith and practices within the [employer].  Prior to hospitalization [name] was getting along fairly well and able to participate in all of her B ADLs/IADLs.  I asked [name] what her understanding of her's disease processes.  She  shares with me that her heart went into an abnormal rhythm requiring multiple shocks.  She shares with me that she realizes her cancer has spread to multiple places within her body and is not curable.  [name] was able to provide insights as well.  He shares that both he and [name] had had very long conversations at the time of diagnosis which entailed not wishing for her to be in any pain or to endorse any suffering.  A detailed discussion was had today  regarding advanced directives -she does not have any on file though she would rely on her husband, [name] to make decisions in her favor if she were unable to do so.    Concepts specific to code status, artifical feeding and hydration, continued IV antibiotics and rehospitalization was had.  Patient is DO NOT RESUSCITATE/DO NOT INTUBATE CODE STATUS.  No heroic measures to prolong life.  The difference between a aggressive medical intervention path  and a palliative comfort care path for this patient at this time was had. Values and goals of care important to patient and family were attempted to be elicited.  [name] above all else values time with her family.  She and I reviewed that we could either continue the measures we are pursuing though ultimately they will not cure her of her underlying disease or that we may transition focus to more of a comfort emphasis.  We talked about transition to comfort measures in house and what that would entail inclusive of medications to control pain, dyspnea, agitation, nausea, itching, and hiccups.  We discussed stopping all uneccessary measures such as blood draws, needle sticks, and frequent vital signs. Utilized reflective listening throughout our time together as [name] became very tearful at the realization of this conversation.  [name] shared with me that she does not want to be in pain and she agrees with being made comfortable.  We reviewed that we never know how much time patients will or will not have but above all else her goals are to optimize her symptom management and enable her to spend time with her family.  Discussed the importance of continued conversation with family and their  medical providers regarding overall plan of care and treatment options, ensuring decisions are within the context of the patients values and GOCs. _____________________________________________ Addendum:  I spoke to patient's sisters [name], and her son [name] in  the waiting room.  There were also multiple other family members present.  Reviewed that Keelee had opted to pursue comfort measures.  Patient's grandson was present and was very tearful.  I requested that the social work team aid in providing kids path resources.  Decision Maker: She can make decisions for herself  SUMMARY OF RECOMMENDATIONS   DNAR/DNI  Transition to comfort measures  Appreciate transitions of care team providing kids path resources  Appreciate spiritual care aiding in prayer at bedside  Unrestricted visitation  Ongoing palliative care support during this very difficult time  Code Status/Advance Care Planning: DNAR/DNI   Symptom Management:  As per [facility]   Palliative Prophylaxis:   Oral care, turn every 2 hours, seizure precautions, aspiration precautions, delirium precautions  Additional Recommendations (Limitations, Scope, Preferences):  For focused care    Psycho-social/Spiritual:   Desire for further Chaplaincy support:  Yes patient is a member of [employer]  Additional Recommendations:  Education on end-of-life processes   Prognosis:  Limited minutes to hours once interventions are stopped  Discharge Planning:  Discharge will be celestial  Vitals:   06/30/20 0610 2020/06/30 0640  BP: (!) 143/74 (!) 147/75  Pulse: (!) 103 (!) 105  Resp:    Temp:    SpO2: 91% 92%    Intake/Output Summary (Last 24 hours) at Jun 30, 2020 0839 Last data filed at 06/30/2020 0600 Gross per 24 hour  Intake 1114.32 ml  Output 500 ml  Net 614.32 ml   Last Weight  Most recent update: 06/22/2020 11:11 PM   Weight  60.5 kg (133 lb 4.8 oz)           Gen: Older woman in mild distress HEENT: moist mucous membranes CV: irregular rate and rhythm PULM: 2 L nasal cannula ABD: soft/nontender EXT: No edema Neuro: Alert and oriented x3  PPS: 10%   This conversation/these recommendations were discussed with patient primary care team, Dr. [place]  Time In:  0730 Time Out: 0840 Total Time: 70 Greater than 50%  of this time was spent counseling and coordinating care related to the above assessment and plan.  [place] Team Team Cell Phone: [phone] Please utilize secure chat with additional questions, if there is no response within 30 minutes please call the above phone number  Palliative Medicine Team providers are available by phone from 7am to 7pm daily and can be reached through the team cell phone.  Should this patient require assistance outside of these hours, please call the patient's attending physician.

## 2020-07-04 NOTE — Death Summary Note (Signed)
DEATH SUMMARY   Patient Details  Name: [name] MRN: [id number] DOB: 03-29-1960  Admission/Discharge Information   Admit Date:  Jun 19, 2020  Date of Death: Date of Death: 06/20/2020  Time of Death: Time of Death: 0917  Length of Stay: 1  Referring Physician: Josetta Huddle, MD   Reason(s) for Hospitalization  Ventricular tachycardia Torsades de pointes  Metastatic renal cell carcinoma  Pulmonary embolism MRSE Bacteremia Ventricular tachyarrhythmia   Diagnoses  Preliminary cause of death: Metastatic renal cell carcinoma ([place]) Secondary Diagnoses (including complications and co-morbidities):  Principal Problem:   Torsades de pointes ([place]) Active Problems:   Normocytic normochromic anemia   Metastatic renal cell carcinoma ([place])   Pulmonary embolism ([place])   Bacteremia   Ventricular tachyarrhythmia ([place])   Seizure [facility])   Brief Hospital Course (including significant findings, care, treatment, and services provided and events leading to death)  CLEONA DOUBLEDAY is a 60 y.o. year old female with past medical history significant for metastatic renal cell carcinoma, recent diagnosis of PE in April 2022 on Eliquis, recent MRSE septicemia on linezolid, history of CVA, essential hypertension, hyperlipidemia, and migraine headaches who presented to [facility], ED via EMS with chief complaint of weakness, fatigue, and dizziness resulting in a syncopal episode at home.  Upon EMS arrival, patient was noted to be in ventricular tachycardia in which spontaneously resolved.  Unfortunately, recurrent ventricular tachycardia event had recurred and patient was cardioverted twice.  Patient was bolused with IV amiodarone and placed on amiodarone drip and received IV magnesium.  EKG notable for prolonged QT interval with QTC 507 ms. Given recurrence of ventricular tachycardia/torsades de points, cardiology was consulted and followed during hospital course.  It was thought that patient was on a number of  medications that could be contributing to her QT prolongation including Remeron, Zofran, Compazine and Ultram.  Patient was additionally started on IV lidocaine.  Her home medications were reviewed and medications that could cause further QT prolongation were discontinued, including transitioning to linezolid to IV vancomycin for further treatment of her underlying MRSE septicemia..  Given patient's severely debilitated state with recent diagnosis of metastatic renal cell carcinoma, palliative care was consulted for assistance with further goals of care and medical decision making.  Patient was seen by palliative care with multiple family members present on 06/20/20 and patient decided to transition her care to comfort measures as she does not want to be in any pain and agrees with being made comfortable.  Patient's care was transitioned at that point to comfort measures and patient passed away at 09:17 AM on Jun 20, 2020 with multiple family members present at bedside.  Condolences offered.    Pertinent Labs and Studies  Significant Diagnostic Studies MR ANGIO HEAD WO CONTRAST  Result Date: 05/23/2020 [facility] NEUROIMAGING REPORT STUDY DATE: 05/22/2020 PATIENT NAME: [name] DOB: 11-Sep-1960 MRN: [id number] ORDERING CLINICIAN: Kathrynn Ducking, MD CLINICAL HISTORY: 60 year old female with stroke. EXAM: MR ANGIO HEAD WO CONTRAST TECHNIQUE: MR angiogram of the head was obtained utilizing 3D time of flight sequences from below the vertebrobasilar junction up to the intracranial vasculature without contrast.  Computerized reconstructions were obtained. CONTRAST: none COMPARISON: none IMAGING SITE: [facility] [address] (1.5 Tesla MRI) FINDINGS: This study is of adequate technical quality. Decreased flow signal within the bilateral left worse than right paraclinoid internal carotid arteries with normal appearing proximal and terminal ICA flow signals.  This may  represent atherosclerosis and stenosis versus artifactual changes.  Flow signal of  the bilateral internal carotid arteries have no stenosis. The bilateral middle and anterior cerebral arteries have no stenosis. The bilateral vertebral, basilar, bilateral posterior cerebral arteries have no stenosis. No aneurysmal dilatations are seen.   MRA head (without) demonstrating: - Decreased flow signal within the bilateral lparaclinoid internal carotid arteries (left worse than right) with normal appearing proximal and terminal ICA flow signals.  This may represent atherosclerosis and stenosis versus artifactual changes. INTERPRETING PHYSICIAN: Penni Bombard, MD Certified in Neurology, Neurophysiology and Neuroimaging [facility] [address] [place], [place] [address] [phone]   MR ANGIO NECK W WO CONTRAST  Result Date: 05/23/2020 [facility] NEUROIMAGING REPORT STUDY DATE: 05/22/2020 PATIENT NAME: [name] DOB: Feb 09, 1960 MRN: [id number] ORDERING CLINICIAN: Kathrynn Ducking, MD CLINICAL HISTORY: 60 year old female with stroke. EXAM: MR ANGIO NECK W WO CONTRAST TECHNIQUE: MR angiogram of the neck was obtained utilizing 3D time-of-flight sequences from the aortic arch up to the intracranial vasculature postbolus contrast infusion.  2D time-of-flight noncontrast views and computerized reconstructions were obtained. CONTRAST: 20 mL MultiHance COMPARISON: none IMAGING SITE: [facility] [address] (1.5 Tesla MRI) FINDINGS: This study is of adequate technical quality.  There is anterograde flow in the bilateral vertebral and carotid arteries on 2D-TOF views.  The flow signal of the left subclavian artery has no stenosis.  The left common, internal and external carotid arteries have no stenosis.  The left vertebral artery has no stenosis from its origin up to the vertebrobasilar junction.  On the right brachiocephalic trunk and subclavian  arteries have no stenosis. The right common, internal and external carotid arteries have no stenosis. The right vertebral artery has no stenosis from its origin to the vertebrobasilar junction. Limited views of the intracranial vasculature are notable for slightly decreased flow signal within the left paraclinoid internal carotid artery as noted on MRA head from same day.   MRA neck with and without contrast demonstrating: -Extracranial carotid arteries have no stenosis.  Vertebral arteries have no stenosis. -Slightly decreased flow signal within left paraclinoid internal carotid artery intracranially as noted on MRA head from same day. INTERPRETING PHYSICIAN: Penni Bombard, MD Certified in Neurology, Neurophysiology and Neuroimaging [facility] [address] [place], [place] [address] [phone]   CT Abdomen Pelvis W Contrast  Addendum Date: 06/07/2020   ADDENDUM REPORT: 06/07/2020 17:27 ADDENDUM: Thrombus in a branch of the SMV in the RIGHT lower quadrant is mildly expansile and may reflect tumor thrombus in this area. There are collateral pathways through the mesentery. The vessel upstream from this is patent. This was likely present previously. Attention on follow-up is suggested. Would also correlate with any worsening RIGHT lower quadrant pain. These results were called by telephone at the time of interpretation on 06/07/2020 at 5:25 pm to provider Dr. Ronnald Nian, Who verbally acknowledged these results. Electronically Signed   By: Zetta Bills M.D.   On: 06/07/2020 17:27   Result Date: 06/07/2020 CLINICAL DATA:  Abdominal abscess, infection suspected. Currently on immunotherapy with nausea vomiting and fever. History of RIGHT renal cell carcinoma. EXAM: CT ABDOMEN AND PELVIS WITH CONTRAST TECHNIQUE: Multidetector CT imaging of the abdomen and pelvis was performed using the standard protocol following bolus administration of intravenous contrast. CONTRAST:  118mL  OMNIPAQUE IOHEXOL 300 MG/ML  SOLN COMPARISON:  Prior imaging from May 04, 2020. FINDINGS: Lower chest: Large LEFT lower lobe mass 3.9 x 2.8 cm previously approximately 4.3 x 3.2 cm. Medial lung base on the  LEFT 3 cm compared to 3.3 cm mass. Medial lung base on the RIGHT (image 7/4) 2.8 cm RIGHT lower lobe mass previously approximately 2.4 cm greatest axial dimension. Other masses show a similar appearance to the prior study obtain just a short time ago. No effusion. No consolidation. Suspected RIGHT lower lobe pulmonary embolism (image 89/5) potential embolism also on image 95/5 in the LEFT lower lobe Hepatobiliary: Thrombus extending directly into the IVC from a large mass in the RIGHT liver and from the RIGHT renal vein, extending just above the IVC confluence. Grossly similar to the prior study. Large RIGHT hepatic lobe mass with heterogeneous enhancement measuring 12 cm greatest axial dimension extending from the RIGHT renal mass. Grossly similar. Anterior RIGHT hepatic lobe mass (image 10/2 6.5 as compared to 6.3 cm. The show heterogeneous and peripheral enhancement. New lesion along the anterior surface of the medial segment LEFT hepatic lobe (image 28/2) 17 mm. New lesion on image 32/2 in hepatic subsegment V 16 mm. Enlarging lesions in the RIGHT hepatic lobe in hepatic subsegment VI (image 43/2) subcentimeter area on the prior study as large is 2.9 cm on today's study. Lesion along the inferior RIGHT hemi liver 4.3 cm as compared to 3.9 cm (image 51/2) Marked enlargement of a lesion along the inferior LEFT hemi liver medial segment (image 41/25.9 cm as compared to 2.7 cm. Pancreas: Normal, without mass, inflammation or ductal dilatation. Spleen: Normal spleen. Adrenals/Urinary Tract: Adrenal glands with obscured LEFT adrenal secondary to large mass potentially involved. The LEFT adrenal is normal. The LEFT kidney is normal. Urinary bladder with smooth contour. RIGHT kidney with infiltrative mass contiguous  with large mass in the RIGHT hemi liver. Invasion of RIGHT renal vein. Extension into IVC and via RIGHT hepatic vein from the intrahepatic portion of the mass. Stomach/Bowel: Stomach is unremarkable. Small bowel without dilation or adjacent stranding. Proximal colon is collapsed. Appendix not visualized. Stool in the distal colon. No adjacent stranding. No perianal stranding. Appendix appears to turn medially away from soft tissue in the pelvis and is not inflamed, no significant change in the appearance of this area since previous imaging Vascular/Lymphatic: Enlarging intra-aortocaval adenopathy in keeping with enlargement of disease elsewhere. 1.8 cm intra-aortocaval lymph node previously 1.5 cm. Increase in general and soft tissue in the intra-aortocaval groove. (Image 38/2) Scattered smaller lymph nodes throughout the retroperitoneum more conspicuous than on previous imaging. No pelvic adenopathy but with retroperitoneal soft tissue in the RIGHT retroperitoneum measuring 4.0 x 1.2 cm previously 4.4 x 1.5 cm. Aortic atherosclerosis. No aneurysmal dilation. Patent abdominal vessels. Reproductive: Post hysterectomy. Other: Small volume ascites in the pelvis.  No free air. Musculoskeletal: Spinal degenerative changes. No acute or destructive bone finding. IMPRESSION: 1. Enlarging hepatic lesions and new hepatic lesions with enlarging upper abdominal adenopathy as described. 2. Signs of pulmonary emboli, again noted in RIGHT lower lobe and perhaps with small new thrombus in LEFT lower lobe, difficult to assess on venous phase. 3. Signs of tumor and likely bland thrombus extending into IVC and RIGHT hepatic vein as well as into the inferior vena cava just above the RIGHT hemidiaphragm. 4. Mixed response with respect to pulmonary findings. 5. Small volume ascites in the pelvis. 6. Aortic atherosclerosis. These results were called by telephone at the time of interpretation on 06/07/2020 at 3:43 pm to provider [facility] , who verbally acknowledged these results. Electronically Signed: By: Zetta Bills M.D. On: 06/07/2020 15:43   US BIOPSY (LIVER)  Result Date: 05/17/2020 INDICATION:  60 year old with a large right renal mass and multiple liver and pulmonary lesions. Findings are compatible with metastatic disease and patient needs a tissue diagnosis. EXAM: ULTRASOUND-GUIDED LIVER LESION BIOPSY MEDICATIONS: None. ANESTHESIA/SEDATION: Moderate (conscious) sedation was employed during this procedure. A total of Versed 2.0 mg and Fentanyl 100 mcg was administered intravenously. Moderate Sedation Time: 14 minutes. The patient's level of consciousness and vital signs were monitored continuously by radiology nursing throughout the procedure under my direct supervision. FLUOROSCOPY TIME:  None COMPLICATIONS: None immediate. PROCEDURE: Informed written consent was obtained from the patient after a thorough discussion of the procedural risks, benefits and alternatives. All questions were addressed. Maximal Sterile Barrier Technique was utilized including caps, mask, sterile gowns, sterile gloves, sterile drape, hand hygiene and skin antiseptic. A timeout was performed prior to the initiation of the procedure. Liver was evaluated with ultrasound. Lesion in the inferior right hepatic lobe was identified and targeted for biopsy. Abdomen was prepped with chlorhexidine and sterile field was created. Skin and soft tissues were anesthetized using 1% lidocaine. Small incision was made. Using ultrasound guidance, 17 gauge coaxial needle was directed into the right hepatic lobe and lesion. Core biopsies were obtained with an 18 gauge core device. Specimens placed in formalin. Gel-Foam slurry was injected through the 17 gauge needle as it was removed. No immediate bleeding. Bandage placed over the puncture site. FINDINGS: Several heterogeneous lesions in the liver compatible with known findings. Biopsy needle was directed into an inferior  right hepatic lesion. No immediate bleeding or hematoma formation. IMPRESSION: Ultrasound-guided core biopsy of a right hepatic lesion. Electronically Signed   By: Markus Daft M.D.   On: 05/17/2020 17:12   DG Chest Port 1 View  Result Date: 06/04/2020 CLINICAL DATA:  Ventricular tachycardia EXAM: PORTABLE CHEST 1 VIEW COMPARISON:  06/10/2020, CT 05/04/2020 FINDINGS: Slightly limited by pacing devices. Bilateral lung masses corresponding to history of known metastatic disease. Low lung volumes with vascular crowding. Hazy atelectasis at both lung bases. Stable cardiomediastinal silhouette. No pneumothorax IMPRESSION: 1. Low lung volumes with vascular crowding and hazy basilar atelectasis 2. Bilateral pulmonary metastatic disease Electronically Signed   By: Donavan Foil M.D.   On: 06/22/2020 19:52   DG CHEST PORT 1 VIEW  Result Date: 06/10/2020 CLINICAL DATA:  Chest where venous distension. EXAM: PORTABLE CHEST 1 VIEW COMPARISON:  CT chest 05/04/2020 and CT abdomen pelvis 06/07/2020. Chest x-ray 06/07/2020 FINDINGS: No new enlarged cardiac silhouette. The heart size and mediastinal contours are unchanged. Similar peripheral right lower lobe 4 cm opacity consistent with known metastases that are better evaluated on CT chest 05/04/2020 and CT abdomen pelvis 06/07/2020. Bibasilar streaky airspace opacities. No large apical mass. No pulmonary edema. No pleural effusion. No pneumothorax. No acute osseous abnormality. IMPRESSION: No acute cardiopulmonary abnormality in a patient with known pulmonary metastases. Electronically Signed   By: Iven Finn M.D.   On: 06/10/2020 04:26   DG Chest Port 1 View  Result Date: 06/07/2020 CLINICAL DATA:  Lung carcinoma with fever EXAM: PORTABLE CHEST 1 VIEW COMPARISON:  Chest CT May 04, 2020 FINDINGS: Multiple nodular opacities again noted throughout the lungs bilaterally. Largest nodular opacities in left lower lobe with largest nodular opacities measuring 4.1 x 2.8 cm  and 4.3 x 3.2 cm. No edema or airspace opacity. Heart size and pulmonary vascular normal. No adenopathy evident by radiography. No bone lesions. 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 No edema or consolidation. Heart size normal. Electronically  Signed   By: Lowella Grip III M.D.   On: 06/07/2020 12:18   ECHOCARDIOGRAM COMPLETE  Result Date: 06/09/2020    ECHOCARDIOGRAM REPORT   Patient Name:   CATALEIA GADE Reek Date of Exam: 06/09/2020 Medical Rec #:  [id number]       Height:       64.0 in Accession #:    [id number]      Weight:       121.0 lb Date of Birth:  December 16, 1960        BSA:          1.580 m Patient Age:    32 years        BP:           162/74 mmHg Patient Gender: F               HR:           113 bpm. Exam Location:  Inpatient Procedure: 2D Echo, 3D Echo, Cardiac Doppler and Color Doppler Indications:    R01.1 Murmur  History:        Patient has no prior history of Echocardiogram examinations.                 Abnormal ECG, Stroke, Signs/Symptoms:Bacteremia; Risk                 Factors:Hypertension. Cancer. Pulmonary embolus.  Sonographer:    Roseanna Rainbow RDCS Referring Phys: [id number] McCormick T TU  Sonographer Comments: Technically difficult study due to poor echo windows, suboptimal parasternal window and suboptimal apical window. IMPRESSIONS  1. Left ventricular ejection fraction, by estimation, is 60 to 65%. Left ventricular ejection fraction by 3D volume is 59 %. The left ventricle has normal function. The left ventricle has no regional wall motion abnormalities. There is mild concentric left ventricular hypertrophy. Indeterminate diastolic filling due to E-A fusion.  2. Right ventricular systolic function is normal. The right ventricular size is normal. There is mildly elevated pulmonary artery systolic pressure.  3. Left atrial size was mildly dilated.  4. The mitral valve is normal in structure. Trivial mitral valve regurgitation. No evidence of  mitral stenosis.  5. The aortic valve is normal in structure. Aortic valve regurgitation is not visualized. No aortic stenosis is present.  6. There is a mobile, nonobstructive mass in the proximal inferior vena cava, measuring at least 3 cm in length, roughly 1 cm in diameter, may represent a thrombembolus in transit or extension of renal cell carcinoma. The inferior vena cava is dilated in size with <50% respiratory variability, suggesting right atrial pressure of 15 mmHg. Conclusion(s)/Recommendation(s): No evidence of valvular vegetations on this transthoracic echocardiogram. Would recommend a transesophageal echocardiogram to exclude infective endocarditis if clinically indicated. 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 FINDINGS  Left Ventricle: Left ventricular ejection fraction, by estimation, is 60 to 65%. Left ventricular ejection fraction by 3D volume is 59 %. The left ventricle has normal function. The left ventricle has no regional wall motion abnormalities. The left ventricular internal cavity size was normal in size. There is mild concentric left ventricular hypertrophy. Indeterminate diastolic filling due to E-A fusion. Normal left ventricular filling pressure. Right Ventricle: The right ventricular size is normal. No increase in right ventricular wall thickness. Right ventricular systolic function is normal. There is mildly elevated pulmonary artery systolic pressure. The tricuspid regurgitant velocity is 2.59  m/s, and with an assumed right atrial pressure of 15 mmHg, the estimated right ventricular systolic pressure is 41.8  mmHg. Left Atrium: Left atrial size was mildly dilated. Right Atrium: Right atrial size was normal in size. Prominent Eustachian valve. Pericardium: There is no evidence of pericardial effusion. Mitral Valve: The mitral valve is normal in structure. Trivial mitral valve regurgitation. No evidence of mitral valve stenosis. Tricuspid Valve: The tricuspid valve is normal in structure.  Tricuspid valve regurgitation is trivial. No evidence of tricuspid stenosis. Aortic Valve: The aortic valve is normal in structure. Aortic valve regurgitation is not visualized. No aortic stenosis is present. Pulmonic Valve: The pulmonic valve was normal in structure. Pulmonic valve regurgitation is not visualized. No evidence of pulmonic stenosis. Aorta: The aortic root is normal in size and structure. Venous: There is a mobile, nonobstructive mass in the proximal inferior vena cava, measuring at least 3 cm in length, roughly 1 cm in diameter, may represent a thrombembolus in transit or extension of renal cell carcinoma. The inferior vena cava is dilated in size with less than 50% respiratory variability, suggesting right atrial pressure of 15 mmHg. IAS/Shunts: No atrial level shunt detected by color flow Doppler.  LEFT VENTRICLE PLAX 2D LVIDd:         3.60 cm LVIDs:         2.20 cm LV PW:         1.25 cm         3D Volume EF LV IVS:        1.20 cm         LV 3D EF:    Left LVOT diam:     2.00 cm                      ventricular LV SV:         79                           ejection LV SV Index:   50                           fraction by LVOT Area:     3.14 cm                     3D volume                                             is 59 %.  LV Volumes (MOD) LV vol d, MOD    75.9 ml       3D Volume EF: A2C:                           3D EF:        59 % LV vol d, MOD    52.0 ml       LV EDV:       113 ml A4C:                           LV ESV:       46 ml LV vol s, MOD    33.7 ml       LV SV:        67 ml A2C: LV vol s, MOD    16.2 ml A4C:  LV SV MOD A2C:   42.2 ml LV SV MOD A4C:   52.0 ml LV SV MOD BP:    39.6 ml RIGHT VENTRICLE             IVC RV S prime:     15.50 cm/s  IVC diam: 2.70 cm TAPSE (M-mode): 2.2 cm LEFT ATRIUM           Index       RIGHT ATRIUM           Index LA diam:      4.30 cm 2.72 cm/m  RA Area:     10.60 cm LA Vol (A2C): 27.7 ml 17.53 ml/m RA Volume:   19.50 ml  12.34 ml/m LA Vol (A4C): 50.6  ml 32.02 ml/m  AORTIC VALVE LVOT Vmax:   155.00 cm/s LVOT Vmean:  89.300 cm/s LVOT VTI:    0.251 m  AORTA Ao Root diam: 3.10 cm Ao Asc diam:  3.50 cm MITRAL VALVE               TRICUSPID VALVE MV Area (PHT): 9.60 cm    TR Peak grad:   26.8 mmHg MV Decel Time: 79 msec     TR Vmax:        259.00 cm/s MV E velocity: 98.80 cm/s                            SHUNTS                            Systemic VTI:  0.25 m                            Systemic Diam: 2.00 cm Dani Gobble Croitoru MD Electronically signed by Sanda Klein MD Signature Date/Time: 06/09/2020/11:13:00 AM    Final     Microbiology Recent Results (from the past 240 hour(s))  Blood Culture (routine x 2)     Status: Abnormal   Collection Time: 06/07/20 11:10 AM   Specimen: BLOOD  Result Value Ref Range Status   Specimen Description   Final    BLOOD LEFT ANTECUBITAL Performed at [facility], [address] [place], [place] [address]    Special Requests   Final    BOTTLES DRAWN AEROBIC AND ANAEROBIC Blood Culture adequate volume Performed at [facility] [address] [place], [place] [address]    Culture  Setup Time   Final    GRAM POSITIVE COCCI AEROBIC BOTTLE ONLY CRITICAL VALUE NOTED.  VALUE IS CONSISTENT WITH PREVIOUSLY REPORTED AND CALLED VALUE.    Culture (A)  Final    STAPHYLOCOCCUS EPIDERMIDIS SUSCEPTIBILITIES PERFORMED ON PREVIOUS CULTURE WITHIN THE LAST 5 DAYS. Performed at [facility], [place] [address] [place], [place] [address]    Report Status 06/10/2020 FINAL  Final  Blood Culture (routine x 2)     Status: Abnormal   Collection Time: 06/07/20 11:10 AM   Specimen: BLOOD  Result Value Ref Range Status   Specimen Description   Final    BLOOD RIGHT ANTECUBITAL Performed at [facility], [place] [address] [place], [place] [address]    Special Requests   Final    BOTTLES DRAWN AEROBIC AND ANAEROBIC Blood Culture adequate volume Performed at [facility]  [address], [place], [place] [address]  Culture  Setup Time   Final    GRAM POSITIVE COCCI IN BOTH AEROBIC AND ANAEROBIC BOTTLES CRITICAL RESULT CALLED TO, READ BACK BY AND VERIFIED WITH: SAM COBLE RN @1221  06/08/20 EB Performed at [facility], [place] [address] [place], [place] [address]    Culture (A)  Final    STAPHYLOCOCCUS EPIDERMIDIS STAPHYLOCOCCUS HOMINIS    Report Status 06/13/2020 FINAL  Final   Organism ID, Bacteria STAPHYLOCOCCUS EPIDERMIDIS  Final   Organism ID, Bacteria STAPHYLOCOCCUS HOMINIS  Final      Susceptibility   Staphylococcus epidermidis - MIC*    CIPROFLOXACIN <=0.5 SENSITIVE Sensitive     ERYTHROMYCIN >=8 RESISTANT Resistant     GENTAMICIN <=0.5 SENSITIVE Sensitive     OXACILLIN <=0.25 SENSITIVE Sensitive     TETRACYCLINE <=1 SENSITIVE Sensitive     VANCOMYCIN <=0.5 SENSITIVE Sensitive     TRIMETH/SULFA <=10 SENSITIVE Sensitive     CLINDAMYCIN <=0.25 SENSITIVE Sensitive     RIFAMPIN <=0.5 SENSITIVE Sensitive     Inducible Clindamycin NEGATIVE Sensitive     * STAPHYLOCOCCUS EPIDERMIDIS   Staphylococcus hominis - MIC*    CIPROFLOXACIN <=0.5 SENSITIVE Sensitive     ERYTHROMYCIN <=0.25 SENSITIVE Sensitive     GENTAMICIN <=0.5 SENSITIVE Sensitive     OXACILLIN >=4 RESISTANT Resistant     TETRACYCLINE <=1 SENSITIVE Sensitive     VANCOMYCIN 1 SENSITIVE Sensitive     TRIMETH/SULFA <=10 SENSITIVE Sensitive     CLINDAMYCIN <=0.25 SENSITIVE Sensitive     RIFAMPIN <=0.5 SENSITIVE Sensitive     Inducible Clindamycin NEGATIVE Sensitive     * STAPHYLOCOCCUS HOMINIS  Blood Culture ID Panel (Reflexed)     Status: Abnormal   Collection Time: 06/07/20 11:10 AM  Result Value Ref Range Status   Enterococcus faecalis NOT DETECTED NOT DETECTED Final   Enterococcus Faecium NOT DETECTED NOT DETECTED Final   Listeria monocytogenes NOT DETECTED NOT DETECTED Final   Staphylococcus species DETECTED (A) NOT DETECTED Final    Comment: CRITICAL RESULT CALLED TO, READ BACK  BY AND VERIFIED WITH: SAM COBLE RN @1221  06/08/20 EB    Staphylococcus aureus (BCID) NOT DETECTED NOT DETECTED Final   Staphylococcus epidermidis DETECTED (A) NOT DETECTED Final    Comment: Methicillin (oxacillin) resistant coagulase negative staphylococcus. Possible blood culture contaminant (unless isolated from more than one blood culture draw or clinical case suggests pathogenicity). No antibiotic treatment is indicated for blood  culture contaminants. CRITICAL RESULT CALLED TO, READ BACK BY AND VERIFIED WITH: SAM COBLE RN @1221  06/08/20 EB    Staphylococcus lugdunensis NOT DETECTED NOT DETECTED Final   Streptococcus species NOT DETECTED NOT DETECTED Final   Streptococcus agalactiae NOT DETECTED NOT DETECTED Final   Streptococcus pneumoniae NOT DETECTED NOT DETECTED Final   Streptococcus pyogenes NOT DETECTED NOT DETECTED Final   A.calcoaceticus-baumannii NOT DETECTED NOT DETECTED Final   Bacteroides fragilis NOT DETECTED NOT DETECTED Final   Enterobacterales NOT DETECTED NOT DETECTED Final   Enterobacter cloacae complex NOT DETECTED NOT DETECTED Final   Escherichia coli NOT DETECTED NOT DETECTED Final   Klebsiella aerogenes NOT DETECTED NOT DETECTED Final   Klebsiella oxytoca NOT DETECTED NOT DETECTED Final   Klebsiella pneumoniae NOT DETECTED NOT DETECTED Final   Proteus species NOT DETECTED NOT DETECTED Final   Salmonella species NOT DETECTED NOT DETECTED Final   Serratia marcescens NOT DETECTED NOT DETECTED Final   Haemophilus influenzae NOT DETECTED NOT DETECTED Final   Neisseria meningitidis NOT DETECTED NOT DETECTED Final   Pseudomonas  aeruginosa NOT DETECTED NOT DETECTED Final   Stenotrophomonas maltophilia NOT DETECTED NOT DETECTED Final   Candida albicans NOT DETECTED NOT DETECTED Final   Candida auris NOT DETECTED NOT DETECTED Final   Candida glabrata NOT DETECTED NOT DETECTED Final   Candida krusei NOT DETECTED NOT DETECTED Final   Candida parapsilosis NOT DETECTED NOT  DETECTED Final   Candida tropicalis NOT DETECTED NOT DETECTED Final   Cryptococcus neoformans/gattii NOT DETECTED NOT DETECTED Final   Methicillin resistance mecA/C DETECTED (A) NOT DETECTED Final    Comment: CRITICAL RESULT CALLED TO, READ BACK BY AND VERIFIED WITH: SAM COBLE RN @1221  06/08/20 EB Performed at [facility] [address], [place], [place] [address]   Resp Panel by RT-PCR (Flu A&B, Covid) Nasopharyngeal Swab     Status: None   Collection Time: 06/07/20 12:08 PM   Specimen: Nasopharyngeal Swab; Nasopharyngeal(NP) swabs in vial transport medium  Result Value Ref Range Status   SARS Coronavirus 2 by RT PCR NEGATIVE NEGATIVE Final    Comment: (NOTE) SARS-CoV-2 target nucleic acids are NOT DETECTED.  The SARS-CoV-2 RNA is generally detectable in upper respiratory specimens during the acute phase of infection. The lowest concentration of SARS-CoV-2 viral copies this assay can detect is 138 copies/mL. A negative result does not preclude SARS-Cov-2 infection and should not be used as the sole basis for treatment or other patient management decisions. A negative result may occur with  improper specimen collection/handling, submission of specimen other than nasopharyngeal swab, presence of viral mutation(s) within the areas targeted by this assay, and inadequate number of viral copies(<138 copies/mL). A negative result must be combined with clinical observations, patient history, and epidemiological information. The expected result is Negative.  Fact Sheet for Patients:  EntrepreneurPulse.com.au  Fact Sheet for Healthcare Providers:  IncredibleEmployment.be  This test is no t yet approved or cleared by the [place] FDA and  has been authorized for detection and/or diagnosis of SARS-CoV-2 by FDA under an Emergency Use Authorization (EUA). This EUA will remain  in effect (meaning this test can be used) for the duration of  the COVID-19 declaration under Section 564(b)(1) of the Act, 21 U.S.C.section 360bbb-3(b)(1), unless the authorization is terminated  or revoked sooner.       Influenza A by PCR NEGATIVE NEGATIVE Final   Influenza B by PCR NEGATIVE NEGATIVE Final    Comment: (NOTE) The Xpert Xpress SARS-CoV-2/FLU/RSV plus assay is intended as an aid in the diagnosis of influenza from Nasopharyngeal swab specimens and should not be used as a sole basis for treatment. Nasal washings and aspirates are unacceptable for Xpert Xpress SARS-CoV-2/FLU/RSV testing.  Fact Sheet for Patients: EntrepreneurPulse.com.au  Fact Sheet for Healthcare Providers: IncredibleEmployment.be  This test is not yet approved or cleared by the [place] FDA and has been authorized for detection and/or diagnosis of SARS-CoV-2 by FDA under an Emergency Use Authorization (EUA). This EUA will remain in effect (meaning this test can be used) for the duration of the COVID-19 declaration under Section 564(b)(1) of the Act, 21 U.S.C. section 360bbb-3(b)(1), unless the authorization is terminated or revoked.  Performed at [facility], [place] [address] [place], [place] [address]   Urine culture     Status: None   Collection Time: 06/07/20  1:34 PM   Specimen: In/Out Cath Urine  Result Value Ref Range Status   Specimen Description   Final    IN/OUT CATH URINE Performed at [place] [address]  , [place] [address]    Special Requests   Final    NONE Performed at [facility], [place] [address] [place], [place] [address]    Culture   Final    NO GROWTH Performed at [facility], [place] [address] [place], [place] [address]    Report Status 06/08/2020 FINAL  Final  Blood culture (routine x 2)     Status: None   Collection Time: 06/08/20  4:09 PM   Specimen: BLOOD LEFT FOREARM  Result Value Ref Range Status    Specimen Description   Final    BLOOD LEFT FOREARM Performed at [facility] [address], [place], [place] [address]    Special Requests   Final    BOTTLES DRAWN AEROBIC AND ANAEROBIC Blood Culture results may not be optimal due to an inadequate volume of blood received in culture bottles Performed at [facility] [address], [place], [place] [address]    Culture   Final    NO GROWTH 5 DAYS Performed at [facility], [place] [address] [place], [place] [address]    Report Status 06/13/2020 FINAL  Final  Blood culture (routine x 2)     Status: None   Collection Time: 06/08/20  4:14 PM   Specimen: BLOOD RIGHT FOREARM  Result Value Ref Range Status   Specimen Description   Final    BLOOD RIGHT FOREARM Performed at [facility] [address] [place], [place] [address]    Special Requests   Final    BOTTLES DRAWN AEROBIC ONLY Blood Culture results may not be optimal due to an inadequate volume of blood received in culture bottles Performed at [facility] [address] [place], [place] [address]    Culture   Final    NO GROWTH 5 DAYS Performed at [facility], [place] [address], [place], [place] [address]    Report Status 06/13/2020 FINAL  Final  Resp Panel by RT-PCR (Flu A&B, Covid) Nasopharyngeal Swab     Status: None   Collection Time: 07/02/2020  8:42 PM   Specimen: Nasopharyngeal Swab; Nasopharyngeal(NP) swabs in vial transport medium  Result Value Ref Range Status   SARS Coronavirus 2 by RT PCR NEGATIVE NEGATIVE Final    Comment: (NOTE) SARS-CoV-2 target nucleic acids are NOT DETECTED.  The SARS-CoV-2 RNA is generally detectable in upper respiratory specimens during the acute phase of infection. The lowest concentration of SARS-CoV-2 viral copies this assay can detect is 138 copies/mL. A negative result does not preclude SARS-Cov-2 infection and should not be used as the sole  basis for treatment or other patient management decisions. A negative result may occur with  improper specimen collection/handling, submission of specimen other than nasopharyngeal swab, presence of viral mutation(s) within the areas targeted by this assay, and inadequate number of viral copies(<138 copies/mL). A negative result must be combined with clinical observations, patient history, and epidemiological information. The expected result is Negative.  Fact Sheet for Patients:  EntrepreneurPulse.com.au  Fact Sheet for Healthcare Providers:  IncredibleEmployment.be  This test is no t yet approved or cleared by the [place] FDA and  has been authorized for detection and/or diagnosis of SARS-CoV-2 by FDA under an Emergency Use Authorization (EUA). This EUA will remain  in effect (meaning this test can be used) for the duration of the COVID-19 declaration under Section 564(b)(1) of the Act, 21 U.S.C.section 360bbb-3(b)(1), unless the authorization is  terminated  or revoked sooner.       Influenza A by PCR NEGATIVE NEGATIVE Final   Influenza B by PCR NEGATIVE NEGATIVE Final    Comment: (NOTE) The Xpert Xpress SARS-CoV-2/FLU/RSV plus assay is intended as an aid in the diagnosis of influenza from Nasopharyngeal swab specimens and should not be used as a sole basis for treatment. Nasal washings and aspirates are unacceptable for Xpert Xpress SARS-CoV-2/FLU/RSV testing.  Fact Sheet for Patients: EntrepreneurPulse.com.au  Fact Sheet for Healthcare Providers: IncredibleEmployment.be  This test is not yet approved or cleared by the [place] FDA and has been authorized for detection and/or diagnosis of SARS-CoV-2 by FDA under an Emergency Use Authorization (EUA). This EUA will remain in effect (meaning this test can be used) for the duration of the COVID-19 declaration under Section 564(b)(1) of the Act,  21 U.S.C. section 360bbb-3(b)(1), unless the authorization is terminated or revoked.  Performed at [facility], [place] [address], [place], [place] [address]     Lab Basic Metabolic Panel: Recent Labs  Lab 06/10/20 [phone] 06/10/20 0401 06/11/20 0354 06/12/20 0419 06/30/2020 1831 06/07/2020 1847 Jul 06, 2020 0346  NA 134*  --  132* 132* 133* 131* 130*  K 3.9  --  4.2 4.2 4.4 4.5 4.2  CL 102  --  101 100 96* 97* 94*  CO2 23  --  24 25 21*  --  20*  GLUCOSE 106*  --  103* 156* 216* 214* 139*  BUN 13  --  11 12 9 12 14   CREATININE 0.77  --  0.67 0.71 1.16* 0.90 1.08*  CALCIUM 8.4*  --  8.4* 8.8* 8.8*  --  8.7*  MG  --  1.6*  --   --  1.8  --  2.3  PHOS  --   --   --   --  4.5  --   --    Liver Function Tests: Recent Labs  Lab 06/04/2020 1831 July 06, 2020 0346  AST 63* 1,290*  ALT 32 552*  ALKPHOS 133* 136*  BILITOT 1.0 1.0  PROT 6.3* 6.0*  ALBUMIN 2.3* 2.2*   No results for input(s): LIPASE, AMYLASE in the last 168 hours. No results for input(s): AMMONIA in the last 168 hours. CBC: Recent Labs  Lab 06/10/20 0334 06/11/20 0354 06/12/20 0419 06/11/2020 1831 06/29/2020 1847 07/06/20 0346  WBC 10.9* 10.2 10.6* 15.3*  --  13.2*  NEUTROABS  --   --   --  12.4*  --  9.2*  HGB 8.7* 7.6* 7.6* 9.7* 10.5* 10.1*  HCT 29.8* 25.6* 25.3* 32.9* 31.0* 33.7*  MCV 82.3 81.5 80.1 81.2  --  79.3*  PLT 296 298 297 452*  --  486*   Cardiac Enzymes: No results for input(s): CKTOTAL, CKMB, CKMBINDEX, TROPONINI in the last 168 hours. Sepsis Labs: Recent Labs  Lab 06/11/20 0354 06/12/20 0419 06/23/2020 1831 06/12/2020 1832 07/03/2020 2338 2020-07-06 0346  WBC 10.2 10.6* 15.3*  --   --  13.2*  LATICACIDVEN  --   --   --  5.9* 3.2*  --     Procedures/Operations     Jaylia Pettus J [place] DO Jul 06, 2020, 10:49 AM

## 2020-07-04 NOTE — Plan of Care (Signed)

## 2020-07-04 NOTE — Progress Notes (Signed)
Spoke with MD [place] Crane. She has called family and spoke with them and asked them all to come in to see pt due to the seriousness of her heart rhythm over the last 2 hrs. Will updated family when they arrive.

## 2020-07-04 NOTE — Progress Notes (Signed)
Patient passed at [phone] with family and spouse at bedside, this was verified by myself and MD Randall Hiss [place]

## 2020-07-04 NOTE — Progress Notes (Signed)
Pt going in and out of VT. Able to break on its own. bp stabile. Pt lethargic but able to answer questions. Pt increasing jaundice as well. Cards and triad paged and updated. Will intiate new orders. Asked pt if she would like [place] to call family in to be with her . She declined at this time. Will cont to re-address.

## 2020-07-04 NOTE — Progress Notes (Signed)
Pt arrived from ED. Pt vss. No acute distress noted. Team checks done. Will assume poc . See charted assessments.

## 2020-07-04 DEATH — deceased

## 2020-07-16 ENCOUNTER — Other Ambulatory Visit: Payer: BC Managed Care – PPO

## 2020-07-16 ENCOUNTER — Ambulatory Visit: Payer: BC Managed Care – PPO

## 2020-07-18 NOTE — Hospital Course (Signed)
[name] is a 60 y.o. female with medical history significant of metastatic renal cell carcinoma, recent MRSA bacteremia, still on linezolid presents with lightheadedness and dizziness that started today.  Patient had syncopal episode at home and EMS was called and patient was noted to be in V. tach that spontaneously resolved.  Patient states at home prior to the event, she started feeling lightheaded dizzy and short of breath and then syncopized.  Patient then had recurrent episode of V. tach and was shocked twice by EMS.  Patient was given 150 mg of amnio by EMS.  Patient discussed with Dr. Darl Householder who had cardiology evaluate the patient.  Patient was started on amiodarone drip here as well as lidocaine drip.  Patient also received magnesium.  Patient is a DNR.  Patient was seen by Dr. Domenic Polite in the ED.  EKG showing sinus rhythm with T wave versions in lateral and anteroseptal leads, prolonged QT.  May show an EF of 60 to 65%.  Patient seen by cardiology, patient was in torsades and felt prolonged QT can be exacerbated by Zofran, Compazine, Ultram and Remeron.  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  129 likely due to V. tach.  Patient husband at bedside and provide history.  Patient states patient was sitting there at home and had a seizure, tonic-clonic in nature dropped her cup last for couple seconds.  Patient does not recall the event, denies any loss of bowel bladder.  Of note patient has a history of seizure 1 time before after COVID-vaccine couple years ago but is not on any medications for it.  At this juncture patient and husband deferred any neurological work-up.  Patient husband helping provide history as well.

## 2020-08-21 ENCOUNTER — Other Ambulatory Visit (HOSPITAL_COMMUNITY): Payer: Self-pay

## 2021-04-12 IMAGING — MR MR HEAD WO/W CM
17 series · 48 of 48 positions shown · IV contrast (gadavist)
Comparison: 07/01/2004

CLINICAL DATA: Renal cancer staging.

EXAM:
MRI HEAD WITHOUT AND WITH CONTRAST
TECHNIQUE: Multiplanar, multiecho pulse sequences of the brain and surrounding
structures were obtained without and with intravenous contrast.
CONTRAST:  6mL GADAVIST GADOBUTROL 1 MMOL/ML IV SOLN

[Series 5: DWI · axial · 3.0mm · 1.36mm/px · z∈[-36,+111]mm · 4 of 104 slices shown (1 of 2)]
[im 1/104]
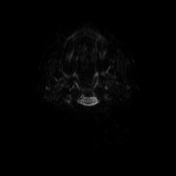
[im 35/104]
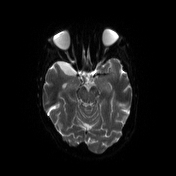
[im 69/104]
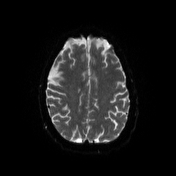
[im 104/104]
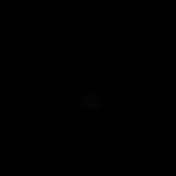

[Series 6: DWI · axial · 3.0mm · 1.36mm/px · z∈[-36,+111]mm · 3 of 52 slices shown (2 of 2)]
[im 1/52]
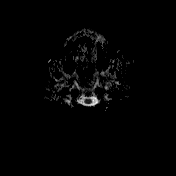
[im 26/52]
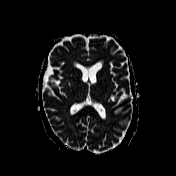
[im 52/52]
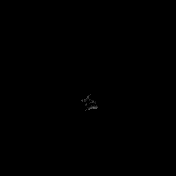

[Series 7: T1 · sagittal · 5.0mm · 0.75mm/px · 1 of 24 slices shown (1 of 4)]
[im 1/24]
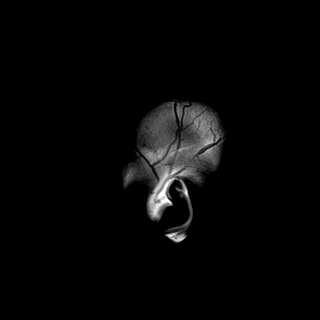

[Series 8: T2 · axial · 5.0mm · 0.62mm/px · 1 of 25 slices shown]
[im 1/25]
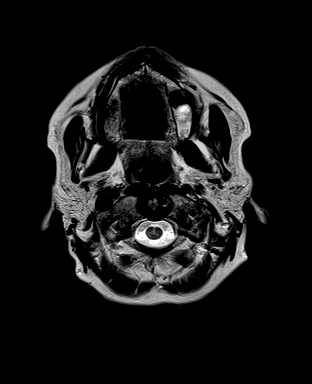

[Series 10: swi_images · axial · 3.0mm · 0.75mm/px · z∈[-42,+118]mm · 3 of 56 slices shown]
[im 1/56]
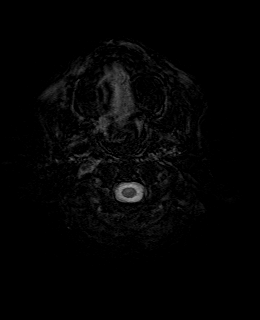
[im 28/56]
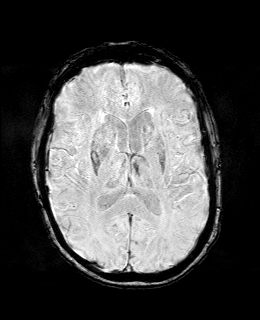
[im 56/56]
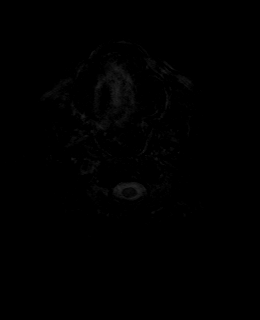

[Series 11: FLAIR · axial · 3.0mm · 0.75mm/px · z∈[-36,+112]mm · 3 of 52 slices shown]
[im 1/52]
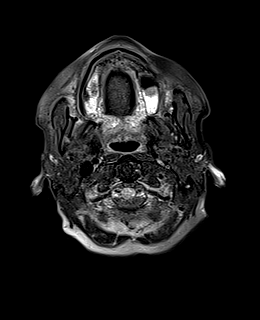
[im 26/52]
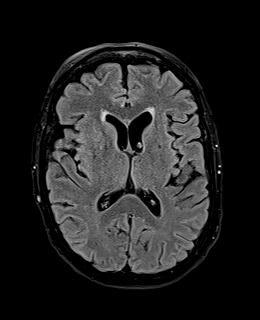
[im 52/52]
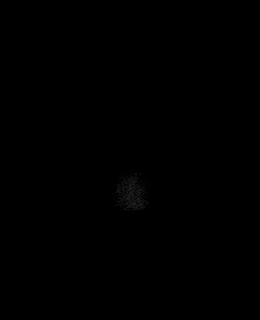

[Series 12: T1 · axial · 1.0mm · 0.94mm/px · z∈[-39,+115]mm · 8 of 160 slices shown (2 of 4)]
[im 1/160]
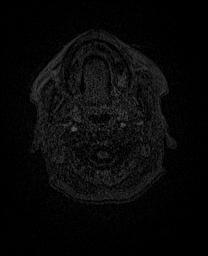
[im 23/160]
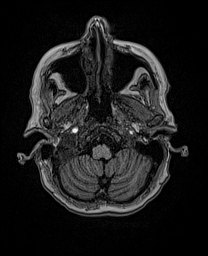
[im 46/160]
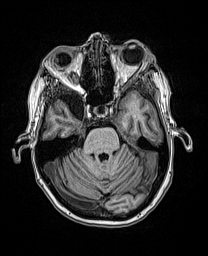
[im 69/160]
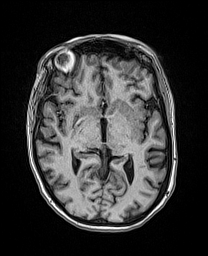
[im 91/160]
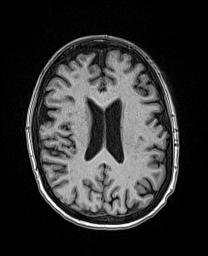
[im 114/160]
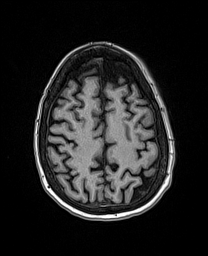
[im 137/160]
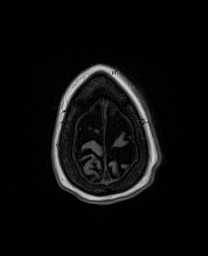
[im 160/160]
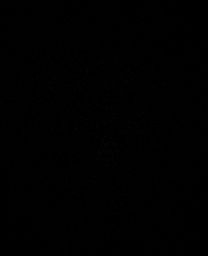

[Series 13: cor dwi_tracew · coronal · 5.0mm · 1.53mm/px · 3 of 56 slices shown]
[im 1/56]
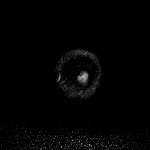
[im 28/56]
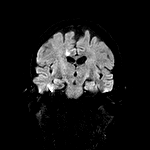
[im 56/56]
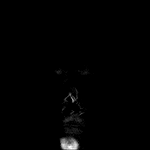

[Series 14: cor dwi_adc · coronal · 5.0mm · 1.53mm/px · 1 of 28 slices shown]
[im 1/28]
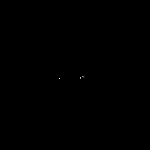

[Series 15: resolve dwi_tracew · axial · 5.0mm · 1.67mm/px · z∈[-45,+118]mm · 3 of 54 slices shown]
[im 1/54]
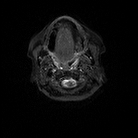
[im 27/54]
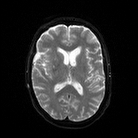
[im 54/54]
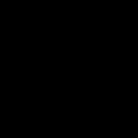

[Series 16: resolve dwi_adc · axial · 5.0mm · 1.67mm/px · 1 of 24 slices shown]
[im 1/24]
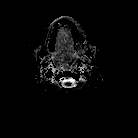

[Series 17: T2 post-contrast · coronal · 5.0mm · 0.57mm/px · 2 of 30 slices shown]
[im 1/30]
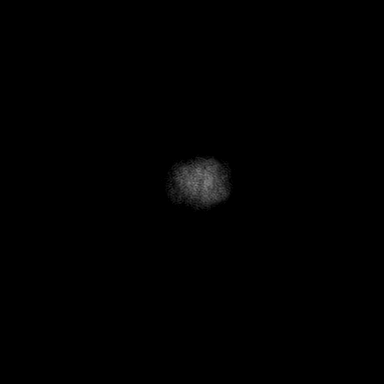
[im 30/30]
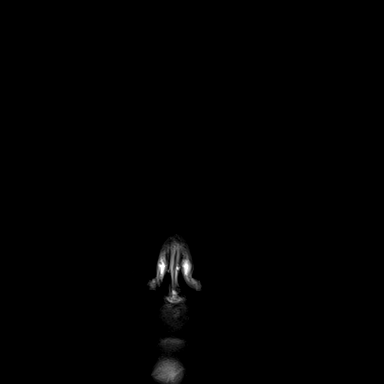

[Series 18: T1 post-contrast · axial · 1.0mm · 0.94mm/px · z∈[-39,+115]mm · 8 of 160 slices shown (1 of 3)]
[im 1/160]
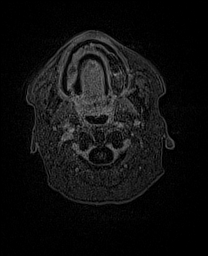
[im 23/160]
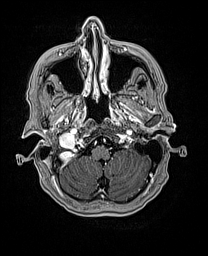
[im 46/160]
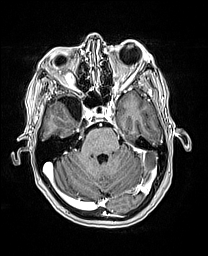
[im 69/160]
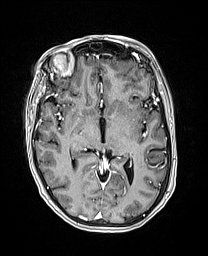
[im 91/160]
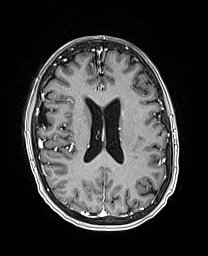
[im 114/160]
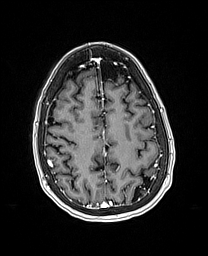
[im 137/160]
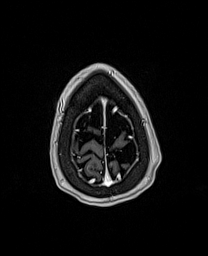
[im 160/160]
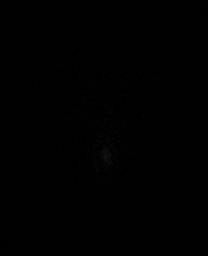

[Series 19: T1 · sagittal · 4.0mm · 0.94mm/px · 2 of 30 slices shown (3 of 4)]
[im 1/30]
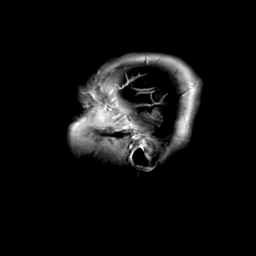
[im 30/30]
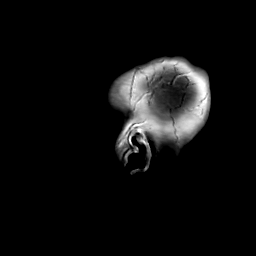

[Series 20: T1 · coronal · 4.0mm · 0.94mm/px · 2 of 30 slices shown (4 of 4)]
[im 1/30]
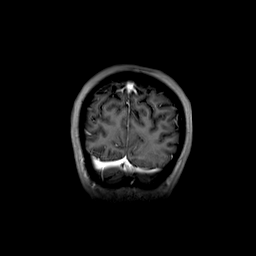
[im 30/30]
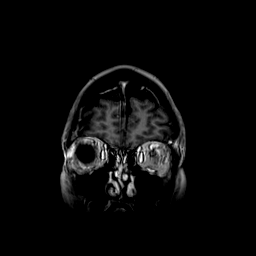

[Series 21: T1 post-contrast · coronal · 5.0mm · 0.43mm/px · 2 of 30 slices shown (2 of 3)]
[im 1/30]
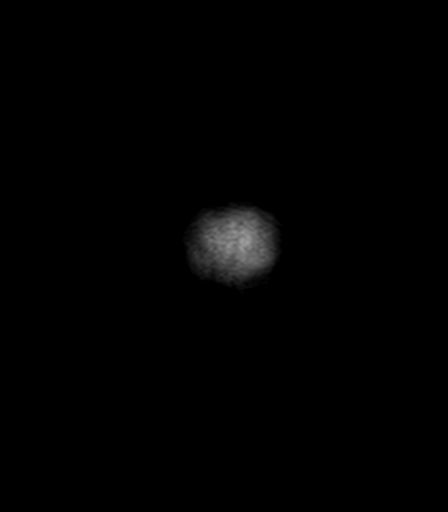
[im 30/30]
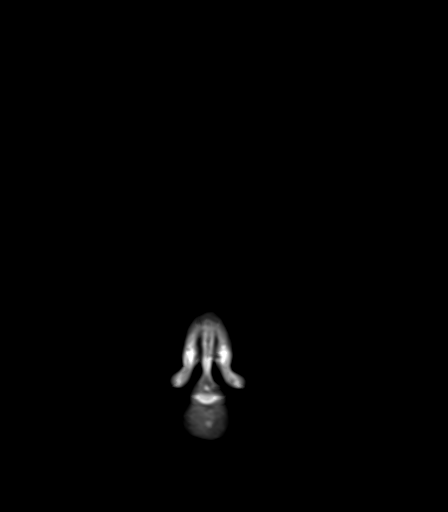

[Series 22: T1 post-contrast · sagittal · 5.0mm · 0.75mm/px · 1 of 24 slices shown (3 of 3)]
[im 1/24]
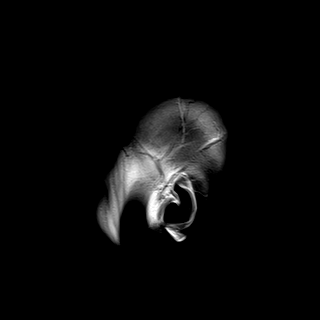

[48 of 48 positions shown; findings below may reference images not displayed]

FINDINGS: Brain: Patchy weakly restricted diffusion in the left thalamus and
along the left frontal parietal cortex. Rounded area of similar
signal is seen in the right centrum semiovale and subcortical right
frontal white matter. Patchy enhancement is seen in the medial left
thalamus. Subcentimeter nodular focus of enhancement at left
occipital cortex on [DATE]. Nodular focus of enhancement at the high
left frontal convexity measuring up to 5 mm, which could be dural or
cortical, new from prior. Curvilinear enhancement along the high and
anterior right a parietal cortex marked on series 18.

Arachnoid cyst in the right middle cranial fossa with mild temporal
lobe mass effect, 3 x 1.3 cm on axial slices. Even smaller arachnoid
cyst in the high anterior left frontal region.

Vascular: Normal flow voids and vascular enhancements.

Skull and upper cervical spine: No noted metastatic disease.

Sinuses/Orbits: Negative

Other: These results will be called to the ordering clinician or
representative by the Radiologist Assistant, and communication
documented in the PACS or [REDACTED].
IMPRESSION: 1. Findings of subacute infarction in the left PCA and right ACA/MCA
territories.
2. Areas of left thalamic and bilateral cortical enhancement are
likely related to subacute infarction.
3. Indeterminate nodular focus of enhancement arising from cortex or
dura along the high left frontal convexity at measuring 5 mm.
4. Recommend brain MRI with contrast in 6-12 weeks to allow for
resolution of infarct related enhancement.

## 2022-11-26 ENCOUNTER — Other Ambulatory Visit: Payer: Self-pay
